# Patient Record
Sex: Female | Born: 1970 | Race: White | Hispanic: No | Marital: Married | Smoking: Former smoker
Health system: Southern US, Community
[De-identification: ages and names within clinical notes are randomized; demographics above are authoritative.]

## PROBLEM LIST (undated history)

## (undated) DIAGNOSIS — F329 Major depressive disorder, single episode, unspecified: Secondary | ICD-10-CM

## (undated) DIAGNOSIS — F419 Anxiety disorder, unspecified: Secondary | ICD-10-CM

## (undated) DIAGNOSIS — K589 Irritable bowel syndrome without diarrhea: Secondary | ICD-10-CM

## (undated) DIAGNOSIS — N62 Hypertrophy of breast: Secondary | ICD-10-CM

## (undated) DIAGNOSIS — Z98811 Dental restoration status: Secondary | ICD-10-CM

## (undated) HISTORY — DX: Irritable bowel syndrome without diarrhea: K58.9

## (undated) HISTORY — PX: NASAL SINUS SURGERY: SHX719

## (undated) HISTORY — DX: Anxiety disorder, unspecified: F41.9

## (undated) HISTORY — PX: TUBAL LIGATION: SHX77

---
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
## 2006-09-02 HISTORY — PX: DILATION AND CURETTAGE OF UTERUS: SHX78

## 2009-09-18 ENCOUNTER — Encounter: Admission: RE | Admit: 2009-09-18 | Discharge: 2009-09-18 | Payer: Self-pay | Admitting: Obstetrics and Gynecology

## 2009-09-21 ENCOUNTER — Encounter: Admission: RE | Admit: 2009-09-21 | Discharge: 2009-09-21 | Payer: Self-pay | Admitting: Obstetrics and Gynecology

## 2009-09-25 ENCOUNTER — Encounter: Admission: RE | Admit: 2009-09-25 | Discharge: 2009-09-25 | Payer: Self-pay | Admitting: Obstetrics and Gynecology

## 2009-12-14 ENCOUNTER — Ambulatory Visit: Payer: Self-pay | Admitting: Oncology

## 2010-05-29 ENCOUNTER — Encounter: Admission: RE | Admit: 2010-05-29 | Discharge: 2010-05-29 | Payer: Self-pay | Admitting: Obstetrics and Gynecology

## 2011-02-04 ENCOUNTER — Ambulatory Visit (INDEPENDENT_AMBULATORY_CARE_PROVIDER_SITE_OTHER): Payer: BC Managed Care – PPO | Admitting: Family Medicine

## 2011-02-04 ENCOUNTER — Encounter: Payer: Self-pay | Admitting: Family Medicine

## 2011-02-04 VITALS — BP 100/68 | HR 72 | Temp 98.5°F | Ht 66.0 in | Wt 115.0 lb

## 2011-02-04 DIAGNOSIS — IMO0002 Reserved for concepts with insufficient information to code with codable children: Secondary | ICD-10-CM

## 2011-02-04 DIAGNOSIS — M549 Dorsalgia, unspecified: Secondary | ICD-10-CM

## 2011-02-04 MED ORDER — NAPROXEN 500 MG PO TABS: 500.0000 mg | ORAL_TABLET | Freq: Two times a day (BID) | ORAL | Status: DC

## 2011-02-04 NOTE — Progress Notes (Signed)
Subjective:    Patient ID: Sabrina Chapman, female    DOB: 01-04-1971, 40 y.o.   MRN: [id number]  HPI Noticed sore on the top of her L foot foot at the base of her great toe L after camping last weekend.  Pulled something out of the skin--?stinger.  Has remained itchy.  Not changed in size, perhaps flattening some, and central area seems firmer. Denies pain, fevers, drainage, or swelling.  Pulled a muscle in her back while doing yoga 3 days ago.  Pushed herself hard in yoga, and felt it the next morning.  Feels muscular, painful, both sides of the low back and sacral area, right slightly greater than left.  Denies radiation of pain.  Denies paresthesias. Used heating pad (Thermacare) yesterday with some relief.  Planning for pregnancy soon, isn't using any contraception (just withdrawal method).  Past Medical History  Diagnosis Date  . Allergy     seasonal, plus cats, dust  . Anxiety in her early 20's    took Paxil    Past Surgical History  Procedure Date  . Dilation and curettage of uterus 2008  . Sinus surgery     polyp removal    History   Social History  . Marital Status: Single    Spouse Name: N/A    Number of Children: N/A  . Years of Education: N/A   Occupational History  . 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 Smoking status: Former Smoker    Quit date: 09/02/1997  . Smokeless tobacco: Never Used  . Alcohol Use: Yes     1-2 drinks every other day  . Drug Use: No  . Sexually Active: Yes    Birth Control/ Protection: Coitus interruptus   Other Topics Concern  . Not on file   Social History Narrative   Engaged    Family History  Problem Relation Age of Onset  . Cancer Mother 40    breast  . Hyperlipidemia Mother   . Hyperlipidemia Father   . Allergies Father   . Depression Father   . Cancer Maternal Grandmother 50    breast cancer  . Heart disease Maternal Grandfather   . Depression Paternal  Grandmother   . Diabetes Neg Hx     Current outpatient prescriptions:folic acid (FOLVITE) 400 MCG tablet, Take 1,200 mcg by mouth daily.  , Disp: , Rfl: ;  naproxen (NAPROSYN) 500 MG tablet, Take 1 tablet (500 mg total) by mouth 2 (two) times daily with a meal., Disp: 30 tablet, Rfl: 0  No Known Allergies  Review of Systems Denies fevers, myalgias, urinary complaints ; Denies URI symptoms, cough, SOB, chest pain, GI complaints, other skin concerns  Objective:   Physical Exam  Well developed, well nourished patient, in no distress BP 100/68  Pulse 72  Temp(Src) 98.5 F (36.9 C) (Oral)  Ht 5\' 6"  (1.676 m)  Wt 115 lb (52.164 kg)  BMI 18.56 kg/m2  LMP 01/20/2011 Neck: No lymphadenopathy or thyromegal Heart:  Regular rate and rhythm, no murmurs, rubs, gallops or ectopy Lungs:  Clear bilaterally, without wheezes, rales or ronchi Abdomen:  Soft, nontender, nondistended, no hepatosplenomegaly or masses, normal bowel sounds Extremities:  No clubbing, cyanosis or edema, 2+ pulses.  Neuro:  Alert and oriented x 3, cranial nerves grossly intact.  DTR's 2+ and symmetric.  Normal strength and sensation Back:  No spine or CVA tenderness.  Mild tenderness, and minimal spasm R lumbar paraspinous muscles.  SI joints nontender. Skin: no rashes or suspicious lesions. Left foot--oval raised area just proximal to  1st MTP.  Small hole centrally noted.  No evidence of foreign body, nontender to touch.  No drainage, warmth, streaks Psych:  Normal mood, affect, hygiene and grooming, normal speech, eye contact     Assessment & Plan:   1. Back pain without radiation    Muscular etiology.  Heat, massage, stretches, NSAIDs--risks/side effects reviewed with pt. Declines muscle relaxant, only mild spasm noted  2. Insect bite of foot or toe    Educated re: signs/symptoms of infection, expected course if there is retained FB.     Naproxen Rx sent to pharmacy. NSAID precautions were reviewed with the patient.   Patient should take medication with food, discontinue if develops GI side effects, not to take other OTC NSAIDs at the same time, and not to use longer than recommended.

## 2011-02-04 NOTE — Patient Instructions (Signed)
Follow up if you are having ongoing back pain, pain or numbness radiating into your legs, weakness, fevers or other problems. Follow up if skin lesion on foot gets larger, is draining, develops redness, warmth, fever or any other concern

## 2011-02-05 ENCOUNTER — Encounter: Payer: Self-pay | Admitting: Family Medicine

## 2011-02-27 ENCOUNTER — Other Ambulatory Visit: Payer: Self-pay | Admitting: Obstetrics and Gynecology

## 2011-02-27 DIAGNOSIS — Z1231 Encounter for screening mammogram for malignant neoplasm of breast: Secondary | ICD-10-CM

## 2011-02-27 DIAGNOSIS — N63 Unspecified lump in unspecified breast: Secondary | ICD-10-CM

## 2011-04-03 ENCOUNTER — Ambulatory Visit (INDEPENDENT_AMBULATORY_CARE_PROVIDER_SITE_OTHER): Payer: BC Managed Care – PPO | Admitting: Family Medicine

## 2011-04-03 ENCOUNTER — Encounter: Payer: Self-pay | Admitting: Family Medicine

## 2011-04-03 VITALS — BP 100/72 | HR 68 | Ht 66.0 in | Wt 116.0 lb

## 2011-04-03 DIAGNOSIS — K589 Irritable bowel syndrome without diarrhea: Secondary | ICD-10-CM

## 2011-04-03 DIAGNOSIS — N926 Irregular menstruation, unspecified: Secondary | ICD-10-CM

## 2011-04-03 LAB — POCT URINE PREGNANCY: Preg Test, Ur: NEGATIVE

## 2011-04-03 MED ORDER — DICYCLOMINE HCL 10 MG PO CAPS: 10.0000 mg | ORAL_CAPSULE | Freq: Three times a day (TID) | ORAL | Status: DC

## 2011-04-03 NOTE — Progress Notes (Signed)
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  She had thought that her problems were related to fructose, but lately has realized that she is having problems with citric acid also (noted in some jams and other foods).  When she eats things containing citric acid, she gets stomach cramps and diarrhea. No issues with lactose--eats a lot of cheese.  Lemons, lime, grapefruit cause problems.  No problems with berries.   Denies blood in stool or mucusy stool, just immediate cramp, followed by loose stool, then pain resolves.  Previously had rx for Librax for treatment of these IBS symptoms--which can prevent some of the symptoms if taken early.  She has been out of it for about a month.  Symptoms are more severe and prolonged without the medication, sometimes lasting for up to a day and a half.  She is trying for pregnancy.  Had somewhat irregular periods recently, concerned about the possibility of recent miscarriage versus current pregnancy.   She had a normal cycle 5/20, lasted a week.  Then, no period in June until 7/1, lasted 4 days, heavy, not typical, abruptly stopped on 4th day.   Prior to this cycle, she had symptoms of breast tenderness, fatigue, slight nausea.  Had a negative pregnancy test prior to getting last period.   Then had what seemed like a normal menstrual cycle that started on 7/20.  Past Medical History  Diagnosis Date  . Allergy     seasonal, plus cats, dust  . Anxiety in her early 20's    took Paxil  . IBS (irritable bowel syndrome)     Past Surgical History  Procedure Date  . Dilation and curettage of uterus 2008  . Sinus surgery     polyp removal    History   Social History  . Marital Status: Single    Spouse Name: N/A    Number of Children: N/A  . Years of Education: N/A   Occupational History  . 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 Smoking status: Former Smoker    Quit  date: 09/02/1997  . Smokeless tobacco: Never Used  . Alcohol Use: Yes     1-2 drinks every other day  . Drug Use: No  . Sexually Active: Yes    Birth Control/ Protection: Coitus interruptus   Other Topics Concern  . Not on file   Social History Narrative   Engaged    Family History  Problem Relation Age of Onset  . Cancer Mother 3    breast  . Hyperlipidemia Mother   . Hyperlipidemia Father   . Allergies Father   . Depression Father   . Cancer Maternal Grandmother 50    breast cancer  . Heart disease Maternal Grandfather   . Depression Paternal Grandmother   . Diabetes Neg Hx    No current outpatient prescriptions on file prior to visit.   No Known Allergies    ROS: denies fever, abdominal pain, nausea, vomiting.  See HPI.  Denies joint/muscle pains  PHYSICAL EXAM: BP 100/72  Pulse 68  Ht 5\' 6"  (1.676 m)  Wt 116 lb (52.617 kg)  BMI 18.72 kg/m2  LMP 03/24/2011 Well developed, pleasant female, in no distress Abdomen: soft, nontender, active bowel sounds.  No organomegaly or masses Psych: normal mood, affect, hygiene, grooming Skin: no rash  Urine pregnancy test negative   ASSESSMENT/PLAN: 1. IBS (irritable bowel syndrome)  2. Menstrual irregularity  POCT urine pregnancy   Discussed avoidance of trigger foods.  Will change from librax to a trial of Bentyl due to pregnancy safety issues (Librax is Cat D, potential human fetal risk; Bentyl is Cat B).  Try and avoid use in first trimester

## 2011-04-03 NOTE — Patient Instructions (Signed)
Try to minimize use of medications in first trimester of pregnancy.  The Bentyl seems to be safe in 2nd and 3rd trimester.  Avoidance of known triggers is key.

## 2011-12-05 LAB — OB RESULTS CONSOLE ABO/RH: RH Type: POSITIVE

## 2011-12-05 LAB — OB RESULTS CONSOLE RUBELLA ANTIBODY, IGM: Rubella: IMMUNE

## 2012-06-16 ENCOUNTER — Telehealth (HOSPITAL_COMMUNITY): Payer: Self-pay | Admitting: *Deleted

## 2012-06-16 ENCOUNTER — Encounter (HOSPITAL_COMMUNITY): Payer: Self-pay | Admitting: *Deleted

## 2012-06-16 NOTE — Telephone Encounter (Signed)
Preadmission screen  

## 2012-06-23 ENCOUNTER — Observation Stay (HOSPITAL_COMMUNITY): Admission: RE | Admit: 2012-06-23 | Payer: Self-pay | Source: Ambulatory Visit | Admitting: Obstetrics & Gynecology

## 2012-07-01 ENCOUNTER — Encounter (HOSPITAL_COMMUNITY): Payer: Self-pay | Admitting: Pharmacist

## 2012-07-07 ENCOUNTER — Other Ambulatory Visit: Payer: Self-pay | Admitting: Obstetrics and Gynecology

## 2012-07-09 ENCOUNTER — Other Ambulatory Visit: Payer: Self-pay | Admitting: Obstetrics and Gynecology

## 2012-07-13 ENCOUNTER — Encounter (HOSPITAL_COMMUNITY): Payer: Self-pay

## 2012-07-13 ENCOUNTER — Encounter (HOSPITAL_COMMUNITY)
Admission: RE | Admit: 2012-07-13 | Discharge: 2012-07-13 | Disposition: A | Discharge status: Home / Self Care | Payer: BC Managed Care – PPO | Source: Ambulatory Visit | Attending: Obstetrics and Gynecology | Admitting: Obstetrics and Gynecology

## 2012-07-13 LAB — CBC
HCT: 37.5 % (ref 36.0–46.0)
Platelets: 233 10*3/uL (ref 150–400)
RDW: 13.3 % (ref 11.5–15.5)
WBC: 8.7 10*3/uL (ref 4.0–10.5)

## 2012-07-13 LAB — SURGICAL PCR SCREEN: MRSA, PCR: NEGATIVE

## 2012-07-13 NOTE — Patient Instructions (Addendum)
Your procedure is scheduled on:07/15/12  Enter through the Main Entrance at :10:45 am per hospital ( 10am per Dr. Henderson Cloud) Pick up desk phone and dial [phone] and inform us of your arrival.  Please call [phone] if you have any problems the morning of surgery.  Remember: Do not eat after midnight:Tuesday Clear liquids ok until 0800 am Wed.  Take these meds the morning of surgery with a sip of water:none  DO NOT wear jewelry, eye make-up, lipstick,body lotion, or dark fingernail polish. Do not shave for 48 hours prior to surgery.  If you are to be admitted after surgery, leave suitcase in car until your room has been assigned. Patients discharged on the day of surgery will not be allowed to drive home.

## 2012-07-13 NOTE — H&P (Addendum)
41 y.o.  [redacted]w[redacted]d    G1P0 comes in for a cesarean section at term for frank breech.  Patient has good fetal movement and no bleeding.    Past Medical History  Diagnosis Date  . Allergy     seasonal, plus cats, dust  . Anxiety in her early 20's    took Paxil  . IBS (irritable bowel syndrome)   . AMA (advanced maternal age) multigravida 35+     Past Surgical History  Procedure Date  . Dilation and curettage of uterus 2008  . Sinus surgery     polyp removal    OB History    Grav Para Term Preterm Abortions TAB SAB Ect Mult Living   1              # Outc Date GA Lbr Len/2nd Wgt Sex Del Anes PTL Lv   1 CUR               History   Social History  . Marital Status: Married    Spouse Name: N/A    Number of Children: N/A  . Years of Education: N/A   Occupational History  . 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 Smoking status: Former Smoker    Quit date: 09/02/1997  . Smokeless tobacco: Never Used  . Alcohol Use: Yes     Comment: 1-2 drinks every other day  . Drug Use: No  . Sexually Active: Yes    Birth Control/ Protection: Coitus interruptus   Other Topics Concern  . Not on file   Social History Narrative   Engaged   Review of patient's allergies indicates no known allergies.   Prenatal Course: Uncomplicated; AMA, underwent CVS and chromosomes are 46XX  There were no vitals filed for this visit.   Lungs/Cor:  NAD Abdomen:  soft, gravid Ex:  no cords, erythema SVE:  NA FHTs:  Present.  A/P   For cesarean sectionat term for frank breech.  All risks, benefits and alternatives discussed with patient and she desires to proceed.  Davetta Olliff A

## 2012-07-14 ENCOUNTER — Encounter (HOSPITAL_COMMUNITY): Payer: Self-pay

## 2012-07-14 LAB — RPR: RPR Ser Ql: NONREACTIVE

## 2012-07-15 ENCOUNTER — Inpatient Hospital Stay (HOSPITAL_COMMUNITY): Payer: BC Managed Care – PPO | Admitting: Anesthesiology

## 2012-07-15 ENCOUNTER — Encounter (HOSPITAL_COMMUNITY): Payer: Self-pay | Admitting: Anesthesiology

## 2012-07-15 ENCOUNTER — Inpatient Hospital Stay (HOSPITAL_COMMUNITY)
Admission: AD | Admit: 2012-07-15 | Discharge: 2012-07-18 | DRG: 371 | Disposition: A | Discharge status: Home / Self Care | Payer: BC Managed Care – PPO | Source: Ambulatory Visit | Attending: Obstetrics and Gynecology | Admitting: Obstetrics and Gynecology

## 2012-07-15 ENCOUNTER — Encounter (HOSPITAL_COMMUNITY): Payer: Self-pay | Admitting: *Deleted

## 2012-07-15 ENCOUNTER — Encounter (HOSPITAL_COMMUNITY)
Admission: AD | Disposition: A | Discharge status: Home / Self Care | Payer: Self-pay | Source: Ambulatory Visit | Attending: Obstetrics and Gynecology

## 2012-07-15 DIAGNOSIS — Z01812 Encounter for preprocedural laboratory examination: Secondary | ICD-10-CM

## 2012-07-15 DIAGNOSIS — O321XX0 Maternal care for breech presentation, not applicable or unspecified: Principal | ICD-10-CM | POA: Diagnosis present

## 2012-07-15 DIAGNOSIS — O09519 Supervision of elderly primigravida, unspecified trimester: Secondary | ICD-10-CM | POA: Diagnosis present

## 2012-07-15 DIAGNOSIS — O34219 Maternal care for unspecified type scar from previous cesarean delivery: Secondary | ICD-10-CM

## 2012-07-15 SURGERY — Surgical Case
Anesthesia: Regional | Site: Abdomen | Wound class: Clean Contaminated

## 2012-07-15 MED ORDER — METHYLERGONOVINE MALEATE 0.2 MG/ML IJ SOLN: 0.2000 mg | INTRAMUSCULAR | Status: DC | PRN

## 2012-07-15 MED ORDER — KETOROLAC TROMETHAMINE 60 MG/2ML IM SOLN
INTRAMUSCULAR | Status: AC
  Administered 2012-07-15: 60 mg via INTRAMUSCULAR
  Filled 2012-07-15: qty 2

## 2012-07-15 MED ORDER — WITCH HAZEL-GLYCERIN EX PADS: 1.0000 "application " | MEDICATED_PAD | CUTANEOUS | Status: DC | PRN

## 2012-07-15 MED ORDER — ONDANSETRON HCL 4 MG/2ML IJ SOLN
INTRAMUSCULAR | Status: DC | PRN
  Administered 2012-07-15: 4 mg via INTRAVENOUS

## 2012-07-15 MED ORDER — HYDROMORPHONE HCL PF 1 MG/ML IJ SOLN
0.2500 mg | INTRAMUSCULAR | Status: DC | PRN
  Administered 2012-07-15 (×2): 0.5 mg via INTRAVENOUS

## 2012-07-15 MED ORDER — ONDANSETRON HCL 4 MG/2ML IJ SOLN: 4.0000 mg | INTRAMUSCULAR | Status: DC | PRN

## 2012-07-15 MED ORDER — EPHEDRINE SULFATE 50 MG/ML IJ SOLN
INTRAMUSCULAR | Status: DC | PRN
  Administered 2012-07-15: 10 mg via INTRAVENOUS

## 2012-07-15 MED ORDER — ZOLPIDEM TARTRATE 5 MG PO TABS: 5.0000 mg | ORAL_TABLET | Freq: Every evening | ORAL | Status: DC | PRN

## 2012-07-15 MED ORDER — NALBUPHINE HCL 10 MG/ML IJ SOLN
5.0000 mg | INTRAMUSCULAR | Status: DC | PRN
  Filled 2012-07-15: qty 1

## 2012-07-15 MED ORDER — CEFAZOLIN SODIUM-DEXTROSE 2-3 GM-% IV SOLR
INTRAVENOUS | Status: DC | PRN
  Administered 2012-07-15: 2 g via INTRAVENOUS

## 2012-07-15 MED ORDER — SCOPOLAMINE 1 MG/3DAYS TD PT72
1.0000 | MEDICATED_PATCH | Freq: Once | TRANSDERMAL | Status: DC
  Administered 2012-07-15: 1.5 mg via TRANSDERMAL

## 2012-07-15 MED ORDER — EPHEDRINE 5 MG/ML INJ
INTRAVENOUS | Status: AC
  Filled 2012-07-15: qty 10

## 2012-07-15 MED ORDER — LANOLIN HYDROUS EX OINT: 1.0000 "application " | TOPICAL_OINTMENT | CUTANEOUS | Status: DC | PRN

## 2012-07-15 MED ORDER — PRENATAL MULTIVITAMIN CH
1.0000 | ORAL_TABLET | Freq: Every day | ORAL | Status: DC
  Administered 2012-07-16 – 2012-07-18 (×3): 1 via ORAL
  Filled 2012-07-15 (×3): qty 1

## 2012-07-15 MED ORDER — DIBUCAINE 1 % RE OINT: 1.0000 "application " | TOPICAL_OINTMENT | RECTAL | Status: DC | PRN

## 2012-07-15 MED ORDER — KETOROLAC TROMETHAMINE 30 MG/ML IJ SOLN: 15.0000 mg | Freq: Once | INTRAMUSCULAR | Status: DC | PRN

## 2012-07-15 MED ORDER — MORPHINE SULFATE (PF) 0.5 MG/ML IJ SOLN
INTRAMUSCULAR | Status: DC | PRN
  Administered 2012-07-15: 100 ug via INTRATHECAL

## 2012-07-15 MED ORDER — LACTATED RINGERS IV SOLN
INTRAVENOUS | Status: DC
  Administered 2012-07-15: 23:00:00 via INTRAVENOUS

## 2012-07-15 MED ORDER — MORPHINE SULFATE 0.5 MG/ML IJ SOLN
INTRAMUSCULAR | Status: AC
  Filled 2012-07-15: qty 10

## 2012-07-15 MED ORDER — OXYTOCIN 40 UNITS IN LACTATED RINGERS INFUSION - SIMPLE MED: 62.5000 mL/h | INTRAVENOUS | Status: AC

## 2012-07-15 MED ORDER — DIPHENHYDRAMINE HCL 25 MG PO CAPS: 25.0000 mg | ORAL_CAPSULE | Freq: Four times a day (QID) | ORAL | Status: DC | PRN

## 2012-07-15 MED ORDER — IBUPROFEN 600 MG PO TABS
600.0000 mg | ORAL_TABLET | Freq: Four times a day (QID) | ORAL | Status: DC
  Administered 2012-07-15 – 2012-07-18 (×11): 600 mg via ORAL
  Filled 2012-07-15 (×11): qty 1

## 2012-07-15 MED ORDER — KETOROLAC TROMETHAMINE 30 MG/ML IJ SOLN: 30.0000 mg | Freq: Four times a day (QID) | INTRAMUSCULAR | Status: AC | PRN

## 2012-07-15 MED ORDER — LACTATED RINGERS IV SOLN
Freq: Once | INTRAVENOUS | Status: AC
  Administered 2012-07-15: 11:00:00 via INTRAVENOUS

## 2012-07-15 MED ORDER — METOCLOPRAMIDE HCL 5 MG/ML IJ SOLN: 10.0000 mg | Freq: Three times a day (TID) | INTRAMUSCULAR | Status: DC | PRN

## 2012-07-15 MED ORDER — SIMETHICONE 80 MG PO CHEW
80.0000 mg | CHEWABLE_TABLET | Freq: Three times a day (TID) | ORAL | Status: DC
  Administered 2012-07-15 – 2012-07-18 (×9): 80 mg via ORAL

## 2012-07-15 MED ORDER — SIMETHICONE 80 MG PO CHEW: 80.0000 mg | CHEWABLE_TABLET | ORAL | Status: DC | PRN

## 2012-07-15 MED ORDER — LACTATED RINGERS IV SOLN
INTRAVENOUS | Status: DC | PRN
  Administered 2012-07-15 (×3): via INTRAVENOUS

## 2012-07-15 MED ORDER — HYDROMORPHONE HCL PF 1 MG/ML IJ SOLN
INTRAMUSCULAR | Status: AC
  Administered 2012-07-15: 0.5 mg via INTRAVENOUS
  Filled 2012-07-15: qty 1

## 2012-07-15 MED ORDER — ACETAMINOPHEN 10 MG/ML IV SOLN
1000.0000 mg | Freq: Four times a day (QID) | INTRAVENOUS | Status: AC | PRN
  Administered 2012-07-15: 1000 mg via INTRAVENOUS
  Filled 2012-07-15: qty 100

## 2012-07-15 MED ORDER — FENTANYL CITRATE 0.05 MG/ML IJ SOLN
INTRAMUSCULAR | Status: DC | PRN
  Administered 2012-07-15: 25 ug via INTRATHECAL

## 2012-07-15 MED ORDER — CEFAZOLIN SODIUM-DEXTROSE 2-3 GM-% IV SOLR: 2.0000 g | INTRAVENOUS | Status: DC

## 2012-07-15 MED ORDER — BUPIVACAINE HCL (PF) 0.5 % IJ SOLN
INTRAMUSCULAR | Status: DC | PRN
  Administered 2012-07-15: 12 mg

## 2012-07-15 MED ORDER — DIPHENHYDRAMINE HCL 25 MG PO CAPS: 25.0000 mg | ORAL_CAPSULE | ORAL | Status: DC | PRN

## 2012-07-15 MED ORDER — METHYLERGONOVINE MALEATE 0.2 MG PO TABS: 0.2000 mg | ORAL_TABLET | ORAL | Status: DC | PRN

## 2012-07-15 MED ORDER — NALOXONE HCL 0.4 MG/ML IJ SOLN
1.0000 ug/kg/h | INTRAMUSCULAR | Status: DC | PRN
  Filled 2012-07-15: qty 2.5

## 2012-07-15 MED ORDER — ONDANSETRON HCL 4 MG/2ML IJ SOLN
INTRAMUSCULAR | Status: AC
  Filled 2012-07-15: qty 2

## 2012-07-15 MED ORDER — MENTHOL 3 MG MT LOZG: 1.0000 | LOZENGE | OROMUCOSAL | Status: DC | PRN

## 2012-07-15 MED ORDER — SODIUM CHLORIDE 0.9 % IJ SOLN: 3.0000 mL | INTRAMUSCULAR | Status: DC | PRN

## 2012-07-15 MED ORDER — DIPHENHYDRAMINE HCL 50 MG/ML IJ SOLN: 12.5000 mg | INTRAMUSCULAR | Status: DC | PRN

## 2012-07-15 MED ORDER — TETANUS-DIPHTH-ACELL PERTUSSIS 5-2.5-18.5 LF-MCG/0.5 IM SUSP
0.5000 mL | Freq: Once | INTRAMUSCULAR | Status: AC
  Administered 2012-07-16: 0.5 mL via INTRAMUSCULAR
  Filled 2012-07-15: qty 0.5

## 2012-07-15 MED ORDER — SCOPOLAMINE 1 MG/3DAYS TD PT72
MEDICATED_PATCH | TRANSDERMAL | Status: AC
  Administered 2012-07-15: 1.5 mg via TRANSDERMAL
  Filled 2012-07-15: qty 1

## 2012-07-15 MED ORDER — FERROUS SULFATE 325 (65 FE) MG PO TABS
325.0000 mg | ORAL_TABLET | Freq: Two times a day (BID) | ORAL | Status: DC
  Administered 2012-07-16 – 2012-07-18 (×5): 325 mg via ORAL
  Filled 2012-07-15 (×5): qty 1

## 2012-07-15 MED ORDER — OXYTOCIN 10 UNIT/ML IJ SOLN
40.0000 [IU] | INTRAVENOUS | Status: DC | PRN
  Administered 2012-07-15: 40 [IU] via INTRAVENOUS

## 2012-07-15 MED ORDER — OXYTOCIN 10 UNIT/ML IJ SOLN
INTRAMUSCULAR | Status: AC
  Filled 2012-07-15: qty 4

## 2012-07-15 MED ORDER — CEFAZOLIN SODIUM-DEXTROSE 2-3 GM-% IV SOLR
INTRAVENOUS | Status: AC
  Filled 2012-07-15: qty 50

## 2012-07-15 MED ORDER — ONDANSETRON HCL 4 MG/2ML IJ SOLN: 4.0000 mg | Freq: Three times a day (TID) | INTRAMUSCULAR | Status: DC | PRN

## 2012-07-15 MED ORDER — PANTOPRAZOLE SODIUM 20 MG PO TBEC
20.0000 mg | DELAYED_RELEASE_TABLET | Freq: Every day | ORAL | Status: DC
  Administered 2012-07-18: 20 mg via ORAL
  Filled 2012-07-15 (×4): qty 1

## 2012-07-15 MED ORDER — DIPHENHYDRAMINE HCL 50 MG/ML IJ SOLN: 25.0000 mg | INTRAMUSCULAR | Status: DC | PRN

## 2012-07-15 MED ORDER — ONDANSETRON HCL 4 MG PO TABS: 4.0000 mg | ORAL_TABLET | ORAL | Status: DC | PRN

## 2012-07-15 MED ORDER — OXYCODONE-ACETAMINOPHEN 5-325 MG PO TABS
1.0000 | ORAL_TABLET | ORAL | Status: DC | PRN
Start: 2012-07-15 — End: 2012-07-18
  Administered 2012-07-16 – 2012-07-18 (×9): 2 via ORAL
  Filled 2012-07-15 (×9): qty 2

## 2012-07-15 MED ORDER — PROMETHAZINE HCL 25 MG/ML IJ SOLN: 6.2500 mg | INTRAMUSCULAR | Status: DC | PRN

## 2012-07-15 MED ORDER — SENNOSIDES-DOCUSATE SODIUM 8.6-50 MG PO TABS
2.0000 | ORAL_TABLET | Freq: Every day | ORAL | Status: DC
  Administered 2012-07-15 – 2012-07-17 (×3): 2 via ORAL

## 2012-07-15 MED ORDER — FENTANYL CITRATE 0.05 MG/ML IJ SOLN
INTRAMUSCULAR | Status: AC
  Filled 2012-07-15: qty 2

## 2012-07-15 MED ORDER — PHENYLEPHRINE HCL 10 MG/ML IJ SOLN
INTRAMUSCULAR | Status: DC | PRN
  Administered 2012-07-15: 40 ug via INTRAVENOUS
  Administered 2012-07-15 (×2): 80 ug via INTRAVENOUS

## 2012-07-15 MED ORDER — NALOXONE HCL 0.4 MG/ML IJ SOLN: 0.4000 mg | INTRAMUSCULAR | Status: DC | PRN

## 2012-07-15 MED ORDER — MEASLES, MUMPS & RUBELLA VAC ~~LOC~~ INJ: 0.5000 mL | INJECTION | Freq: Once | SUBCUTANEOUS | Status: DC

## 2012-07-15 MED ORDER — PHENYLEPHRINE 40 MCG/ML (10ML) SYRINGE FOR IV PUSH (FOR BLOOD PRESSURE SUPPORT)
PREFILLED_SYRINGE | INTRAVENOUS | Status: AC
  Filled 2012-07-15: qty 5

## 2012-07-15 MED ORDER — KETOROLAC TROMETHAMINE 60 MG/2ML IM SOLN
60.0000 mg | Freq: Once | INTRAMUSCULAR | Status: AC | PRN
  Administered 2012-07-15: 60 mg via INTRAMUSCULAR

## 2012-07-15 MED ORDER — BISACODYL 10 MG RE SUPP: 10.0000 mg | Freq: Every day | RECTAL | Status: DC | PRN

## 2012-07-15 MED ORDER — FLEET ENEMA 7-19 GM/118ML RE ENEM: 1.0000 | ENEMA | Freq: Every day | RECTAL | Status: DC | PRN

## 2012-07-15 MED ORDER — MEPERIDINE HCL 25 MG/ML IJ SOLN: 6.2500 mg | INTRAMUSCULAR | Status: DC | PRN

## 2012-07-15 SURGICAL SUPPLY — 31 items
CLOTH BEACON ORANGE TIMEOUT ST (SAFETY) ×2 IMPLANT
DERMABOND ADVANCED (GAUZE/BANDAGES/DRESSINGS) ×2
DERMABOND ADVANCED .7 DNX12 (GAUZE/BANDAGES/DRESSINGS) ×2 IMPLANT
DRAPE SURG 17X23 STRL (DRAPES) ×2 IMPLANT
DRSG COVADERM 4X10 (GAUZE/BANDAGES/DRESSINGS) ×4 IMPLANT
DURAPREP 26ML APPLICATOR (WOUND CARE) ×2 IMPLANT
ELECT REM PT RETURN 9FT ADLT (ELECTROSURGICAL) ×2
ELECTRODE REM PT RTRN 9FT ADLT (ELECTROSURGICAL) ×1 IMPLANT
EXTRACTOR VACUUM BELL STYLE (SUCTIONS) IMPLANT
GLOVE BIO SURGEON STRL SZ7 (GLOVE) ×4 IMPLANT
GOWN PREVENTION PLUS LG XLONG (DISPOSABLE) ×4 IMPLANT
KIT ABG SYR 3ML LUER SLIP (SYRINGE) IMPLANT
NEEDLE HYPO 25X5/8 SAFETYGLIDE (NEEDLE) IMPLANT
NS IRRIG 1000ML POUR BTL (IV SOLUTION) ×2 IMPLANT
PACK C SECTION WH (CUSTOM PROCEDURE TRAY) ×2 IMPLANT
PAD OB MATERNITY 4.3X12.25 (PERSONAL CARE ITEMS) IMPLANT
RETRACTOR WND ALEXIS 25 LRG (MISCELLANEOUS) ×1 IMPLANT
RTRCTR WOUND ALEXIS 25CM LRG (MISCELLANEOUS) ×2
SLEEVE SCD COMPRESS KNEE MED (MISCELLANEOUS) IMPLANT
STAPLER VISISTAT 35W (STAPLE) IMPLANT
SUT MNCRL 0 VIOLET CTX 36 (SUTURE) ×2 IMPLANT
SUT MONOCRYL 0 CTX 36 (SUTURE) ×2
SUT PDS AB 0 CTX 60 (SUTURE) IMPLANT
SUT PLAIN 2 0 XLH (SUTURE) IMPLANT
SUT VIC AB 0 CT1 27 (SUTURE) ×2
SUT VIC AB 0 CT1 27XBRD ANBCTR (SUTURE) ×2 IMPLANT
SUT VIC AB 2-0 CT1 27 (SUTURE) ×1
SUT VIC AB 2-0 CT1 TAPERPNT 27 (SUTURE) ×1 IMPLANT
TOWEL OR 17X24 6PK STRL BLUE (TOWEL DISPOSABLE) ×4 IMPLANT
TRAY FOLEY CATH 14FR (SET/KITS/TRAYS/PACK) ×2 IMPLANT
WATER STERILE IRR 1000ML POUR (IV SOLUTION) ×2 IMPLANT

## 2012-07-15 NOTE — Anesthesia Postprocedure Evaluation (Signed)
Anesthesia Post Note  Patient: Sabrina Chapman  Procedure(s) Performed: Procedure(s) (LRB): CESAREAN SECTION (N/A)  Anesthesia type: Spinal  Patient location: PACU  Post pain: Pain level controlled  Post assessment: Post-op Vital signs reviewed  Last Vitals:  Filed Vitals:   07/15/12 1300  BP:   Pulse: 71  Temp:   Resp: 20    Post vital signs: Reviewed  Level of consciousness: awake  Complications: No apparent anesthesia complications

## 2012-07-15 NOTE — Addendum Note (Signed)
Addendum  created 07/15/12 1934 by Graciela Husbands, CRNA   Modules edited:Notes Section

## 2012-07-15 NOTE — Addendum Note (Signed)
Addendum  created 07/15/12 1718 by Velna Hatchet, MD   Modules edited:Orders

## 2012-07-15 NOTE — Op Note (Addendum)
07/15/2012  11:36 AM  PATIENT:  Sabrina Chapman  41 y.o. female  PRE-OPERATIVE DIAGNOSIS:  BREECH PRIMARY  POST-OPERATIVE DIAGNOSIS: same  PROCEDURE:  Procedure(s) (LRB) with comments: CESAREAN SECTION (N/Chapman)  SURGEON:  Surgeon(s) and Role:    * Tamee Battin Chapman Maxyne Derocher, MD - Primary    * W Scott Bowie, MD - Assisting  PHYSICIAN ASSISTANT:   ASSISTANTS: Dr. Bowie   ANESTHESIA:   spinal  EBL:  Total I/O In: 2000 [I.V.:2000] Out: 800 [Urine:200; Blood:600]  SPECIMEN:  No Specimen  DISPOSITION OF SPECIMEN:  N/Chapman  COUNTS:  YES  TOURNIQUET:  * No tourniquets in log *  DICTATION: .Note written in EPIC  PLAN OF CARE: Admit to inpatient   PATIENT DISPOSITION:  PACU - hemodynamically stable.   Delay start of Pharmacological VTE agent (>24hrs) due to surgical blood loss or risk of bleeding: not applicable  Complications:  none Medications:  Ancef, Pitocin Findings:  Baby female, Apgars 9,9, weight P.   Normal tubes, ovaries and uterus seen.  Technique:  After adequate spinal anesthesia was achieved, the patient was prepped and draped in usual sterile fashion.  Chapman foley catheter was used to drain the bladder.  Chapman pfannanstiel incision was made with the scalpel and carried down to the fascia with the bovie cautery. The fascia was incised in the midline with the scalpel and carried in Chapman transverse curvilinear manner bilaterally.  The fascia was reflected superiorly and inferiorly off the rectus muscles and the muscles split in the midline.  Chapman bowel free portion of the peritoneum was entered bluntly and then extended in Chapman superior and inferior manner with good visualization of the bowel and bladder.  The Alexis instrument was then placed and the vesico-uterine fascia tented up and incised in Chapman transverse curvilinear manner.  Chapman 2 cm transverse incision was made in the upper portion of the lower uterine segment until the amnion was exposed.   The incision was extended transversely  in Chapman blunt manner.  Clear fluid with small amount meconium at the fetal anus only was noted and the baby delivered in the breech presentation without complication.  The baby was bulb suctioned and the cord was clamped and cut.  The baby was then handed to awaiting Neonatology.  The placenta was then delivered manually and the uterus cleared of all debris.  The uterine incision was then closed with Chapman running lock stitch of 0 monocryl.  An imbricating layer of 0 monocryl was closed as well. Good hemostasis of the uterine incision was achieved and the abdomen was cleared with irrigation.  The peritoneum was closed with Chapman running stitch of 2-0 vicryl.  This incorporated the rectus muscles as Chapman separate layer.  The fascia was then closed with Chapman running stitch of 0 vicryl.  The subcutaneous layer was closed with interrupted  stitches of 2-0 plain gut.  The skin was closed with staples.  The patient tolerated the procedure well and was returned to the recovery room in stable condition.  All counts were correct times three.  Sabrina Chapman    

## 2012-07-15 NOTE — Brief Op Note (Addendum)
07/15/2012  11:36 AM  PATIENT:  Sabrina Chapman  41 y.o. female  PRE-OPERATIVE DIAGNOSIS:  BREECH PRIMARY  POST-OPERATIVE DIAGNOSIS: same  PROCEDURE:  Procedure(s) (LRB) with comments: CESAREAN SECTION (N/A)  SURGEON:  Surgeon(s) and Role:    * Loney Laurence, MD - Primary    * W Lodema Hong, MD - Assisting  PHYSICIAN ASSISTANT:   ASSISTANTS: Dr. Greta Doom   ANESTHESIA:   spinal  EBL:  Total I/O In: 2000 [I.V.:2000] Out: 800 [Urine:200; Blood:600]  SPECIMEN:  No Specimen  DISPOSITION OF SPECIMEN:  N/A  COUNTS:  YES  TOURNIQUET:  * No tourniquets in log *  DICTATION: .Note written in EPIC  PLAN OF CARE: Admit to inpatient   PATIENT DISPOSITION:  PACU - hemodynamically stable.   Delay start of Pharmacological VTE agent (>24hrs) due to surgical blood loss or risk of bleeding: not applicable  Complications:  none Medications:  Ancef, Pitocin Findings:  Baby female, Apgars 9,9, weight P.   Normal tubes, ovaries and uterus seen.  Technique:  After adequate spinal anesthesia was achieved, the patient was prepped and draped in usual sterile fashion.  A foley catheter was used to drain the bladder.  A pfannanstiel incision was made with the scalpel and carried down to the fascia with the bovie cautery. The fascia was incised in the midline with the scalpel and carried in a transverse curvilinear manner bilaterally.  The fascia was reflected superiorly and inferiorly off the rectus muscles and the muscles split in the midline.  A bowel free portion of the peritoneum was entered bluntly and then extended in a superior and inferior manner with good visualization of the bowel and bladder.  The Alexis instrument was then placed and the vesico-uterine fascia tented up and incised in a transverse curvilinear manner.  A 2 cm transverse incision was made in the upper portion of the lower uterine segment until the amnion was exposed.   The incision was extended transversely  in a blunt manner.  Clear fluid with small amount meconium at the fetal anus only was noted and the baby delivered in the breech presentation without complication.  The baby was bulb suctioned and the cord was clamped and cut.  The baby was then handed to awaiting Neonatology.  The placenta was then delivered manually and the uterus cleared of all debris.  The uterine incision was then closed with a running lock stitch of 0 monocryl.  An imbricating layer of 0 monocryl was closed as well. Good hemostasis of the uterine incision was achieved and the abdomen was cleared with irrigation.  The peritoneum was closed with a running stitch of 2-0 vicryl.  This incorporated the rectus muscles as a separate layer.  The fascia was then closed with a running stitch of 0 vicryl.  The subcutaneous layer was closed with interrupted  stitches of 2-0 plain gut.  The skin was closed with staples.  The patient tolerated the procedure well and was returned to the recovery room in stable condition.  All counts were correct times three.  [name] A

## 2012-07-15 NOTE — Transfer of Care (Signed)
Immediate Anesthesia Transfer of Care Note  Patient: Sabrina Chapman  Procedure(s) Performed: Procedure(s) (LRB) with comments: CESAREAN SECTION (N/A)  Patient Location: PACU  Anesthesia Type:Spinal  Level of Consciousness: awake, alert  and oriented  Airway & Oxygen Therapy: Patient Spontanous Breathing  Post-op Assessment: Report given to PACU RN and Post -op Vital signs reviewed and stable  Post vital signs: Reviewed and stable  Complications: No apparent anesthesia complications

## 2012-07-15 NOTE — Progress Notes (Signed)
There has been no change in the patients history, status or exam since the history and physical.  Filed Vitals:   06/30/12 1522 07/15/12 1019  BP:  141/98  Pulse:  85  Temp:  97.9 F (36.6 C)  TempSrc:  Oral  Resp:  18  Height: 5\' 7"  (1.702 m)   Weight: 66.225 kg (146 lb)   SpO2:  99%    Lab Results  Component Value Date   WBC 8.7 07/13/2012   HGB 13.2 07/13/2012   HCT 37.5 07/13/2012   MCV 93.8 07/13/2012   PLT 233 07/13/2012   Baby is still breech by bedside u/s.   Janira Mandell A

## 2012-07-15 NOTE — Anesthesia Preprocedure Evaluation (Signed)
Anesthesia Evaluation  Patient identified by MRN, date of birth, ID band Patient awake    Reviewed: Allergy & Precautions, H&P , NPO status , Patient's Chart, lab work & pertinent test results  Airway Mallampati: I TM Distance: >3 FB Neck ROM: full    Dental No notable dental hx.    Pulmonary neg pulmonary ROS,    Pulmonary exam normal       Cardiovascular negative cardio ROS      Neuro/Psych PSYCHIATRIC DISORDERS Anxiety negative neurological ROS     GI/Hepatic Neg liver ROS, GERD-  Medicated and Controlled,  Endo/Other  negative endocrine ROS  Renal/GU negative Renal ROS  negative genitourinary   Musculoskeletal negative musculoskeletal ROS (+)   Abdominal Normal abdominal exam  (+)   Peds negative pediatric ROS (+)  Hematology negative hematology ROS (+)   Anesthesia Other Findings   Reproductive/Obstetrics (+) Pregnancy                           Anesthesia Physical Anesthesia Plan  ASA: II  Anesthesia Plan: Spinal   Post-op Pain Management:    Induction:   Airway Management Planned:   Additional Equipment:   Intra-op Plan:   Post-operative Plan:   Informed Consent: I have reviewed the patients History and Physical, chart, labs and discussed the procedure including the risks, benefits and alternatives for the proposed anesthesia with the patient or authorized representative who has indicated his/her understanding and acceptance.     Plan Discussed with: CRNA and Surgeon  Anesthesia Plan Comments:         Anesthesia Quick Evaluation

## 2012-07-15 NOTE — Anesthesia Procedure Notes (Signed)
Spinal  Patient location during procedure: OR Start time: 07/15/2012 11:03 AM Staffing Anesthesiologist: Brayton Caves R Performed by: anesthesiologist  Preanesthetic Checklist Completed: patient identified, site marked, surgical consent, pre-op evaluation, timeout performed, IV checked, risks and benefits discussed and monitors and equipment checked Spinal Block Patient position: sitting Prep: DuraPrep Patient monitoring: heart rate, cardiac monitor, continuous pulse ox and blood pressure Approach: midline Location: L3-4 Injection technique: single-shot Needle Needle type: Sprotte  Needle gauge: 24 G Needle length: 9 cm Assessment Sensory level: T4 Additional Notes Patient identified.  Risk benefits discussed including failed block, incomplete pain control, headache, nerve damage, paralysis, blood pressure changes, nausea, vomiting, reactions to medication both toxic or allergic, and postpartum back pain.  Patient expressed understanding and wished to proceed.  All questions were answered.  Sterile technique used throughout procedure.  CSF was clear.  No parasthesia or other complications.  Please see nursing notes for vital signs.

## 2012-07-15 NOTE — Anesthesia Postprocedure Evaluation (Signed)
  Anesthesia Post-op Note  Patient: Sabrina Chapman  Procedure(s) Performed: Procedure(s) (LRB) with comments: CESAREAN SECTION (N/A)  Patient Location: Mother/Baby  Anesthesia Type:Spinal  Level of Consciousness: awake, alert  and oriented  Airway and Oxygen Therapy: Patient Spontanous Breathing  Post-op Pain: mild  Post-op Assessment: Post-op Vital signs reviewed, Patient's Cardiovascular Status Stable, No headache, No backache, No residual numbness and No residual motor weakness  Post-op Vital Signs: Reviewed and stable  Complications: No apparent anesthesia complications

## 2012-07-16 ENCOUNTER — Encounter (HOSPITAL_COMMUNITY): Payer: Self-pay | Admitting: Obstetrics and Gynecology

## 2012-07-16 LAB — CBC
HCT: 27.7 % — ABNORMAL LOW (ref 36.0–46.0)
MCHC: 34.3 g/dL (ref 30.0–36.0)
MCV: 94.5 fL (ref 78.0–100.0)
Platelets: 174 10*3/uL (ref 150–400)
RDW: 13.5 % (ref 11.5–15.5)
WBC: 8.7 10*3/uL (ref 4.0–10.5)

## 2012-07-16 MED ORDER — INFLUENZA VIRUS VACC SPLIT PF IM SUSP
0.5000 mL | INTRAMUSCULAR | Status: AC
  Administered 2012-07-17: 0.5 mL via INTRAMUSCULAR

## 2012-07-16 NOTE — Progress Notes (Signed)
  Patient is eating, ambulating, voiding.  Pain control is good.  Filed Vitals:   07/15/12 2105 07/15/12 2305 07/16/12 0256 07/16/12 0655  BP: 124/75 119/71 115/74 128/83  Pulse: 64 70 70 76  Temp: 98.4 F (36.9 C) 98.6 F (37 C) 98.5 F (36.9 C) 98.3 F (36.8 C)  TempSrc: Oral Oral Oral Oral  Resp: 16 16 16 16   Height:      Weight:      SpO2: 98% 97% 97% 98%    lungs:   clear to auscultation cor:    RRR Abdomen:  soft, appropriate tenderness, incisions intact and without erythema or exudate ex:    no cords   Lab Results  Component Value Date   WBC 8.7 07/16/2012   HGB 9.5* 07/16/2012   HCT 27.7* 07/16/2012   MCV 94.5 07/16/2012   PLT 174 07/16/2012    B/Positive/-- (04/04 0000)/RI  A/P    Post operative day 1.  Routine post op and postpartum care.  Expect d/c tomorrow.  Percocet for pain control.

## 2012-07-16 NOTE — Progress Notes (Signed)
Pt's BPs had been elevated in PACU yesterday but are normalized today.

## 2012-07-16 NOTE — Progress Notes (Signed)

## 2012-07-17 MED ORDER — TETANUS-DIPHTH-ACELL PERTUSSIS 5-2.5-18.5 LF-MCG/0.5 IM SUSP: 0.5000 mL | Freq: Once | INTRAMUSCULAR | Status: DC

## 2012-07-18 MED ORDER — IBUPROFEN 600 MG PO TABS: 600.0000 mg | ORAL_TABLET | Freq: Four times a day (QID) | ORAL | Status: DC

## 2012-07-18 MED ORDER — FERROUS SULFATE 325 (65 FE) MG PO TABS: 325.0000 mg | ORAL_TABLET | Freq: Every day | ORAL | Status: DC

## 2012-07-18 MED ORDER — OXYCODONE-ACETAMINOPHEN 5-325 MG PO TABS: 1.0000 | ORAL_TABLET | ORAL | Status: DC | PRN

## 2012-07-18 NOTE — Discharge Summary (Signed)
Obstetric Discharge Summary Reason for Admission: cesarean section Prenatal Procedures: ultrasound - breech presentation Intrapartum Procedures: breech extraction and cesarean: low cervical, transverse Postpartum Procedures: none Complications-Operative and Postpartum: none Hemoglobin  Date Value Range Status  07/16/2012 9.5* 12.0 - 15.0 g/dL Final     HCT  Date Value Range Status  07/16/2012 27.7* 36.0 - 46.0 % Final    Physical Exam:  General: alert and cooperative Lochia: appropriate Uterine Fundus: firm Incision: healing well, no significant drainage, no dehiscence, no significant erythema DVT Evaluation: No evidence of DVT seen on physical exam.  Discharge Diagnoses: Term Pregnancy-delivered  Discharge Information: Date: 07/18/2012 Activity: pelvic rest Diet: routine Medications: PNV, Ibuprofen, Colace, Iron and Percocet Condition: stable Instructions: refer to practice specific booklet Discharge to: home Follow-up Information    Follow up with HORVATH,MICHELLE A, MD. In 2 weeks.   Contact information:   [address] [place] [address] [phone]          Newborn Data: Live born female  Birth Weight: 6 lb 4.4 oz (2845 g) APGAR: 8, 9  Home with mother.  Sabrina Chapman 07/18/2012, 11:37 AM

## 2012-07-20 ENCOUNTER — Ambulatory Visit (HOSPITAL_COMMUNITY)
Admission: RE | Admit: 2012-07-20 | Discharge: 2012-07-20 | Disposition: A | Discharge status: Home / Self Care | Payer: BC Managed Care – PPO | Source: Ambulatory Visit | Attending: Pediatrics | Admitting: Pediatrics

## 2012-07-20 NOTE — Progress Notes (Signed)
Infant Lactation Consultation Outpatient Visit Note  Patient Name: Avary Bessire Date of Birth: [birth year] Birth Weight:   Gestational Age at Delivery: Gestational Age: <None> Type of Delivery:   Breastfeeding History Frequency of Breastfeeding: q 2-3 hours Length of Feeding: 10-30 minutes Voids:  Stools:   Supplementing / Method: Pumping:  Type of Pump: Medela   Frequency: Occassionally  Volume:  The most was 1 oz total  Comments:    Consultation Evaluation: Mom had just been to Ped. Concerned about weight. Ped instructed mom to supplement with 1/2 - 1 oz each feeding of EBM of formula. Assisted mom with syringe/ feeding tube to supplement while at the breast,  Initial Feeding Assessment: Pre-feed Weight: 5- 5.7  2430g Post-feed Weight: 5- 6.5  2452 Amount Transferred: 22 Comments:Assisted mom with syringe/ feeding tube to supplement while at the breast, Baby was fussy before feeding but got up next to breast and went to sleep-had just fed 1 hour ago. Baby took 20 cc's formula from syringe/feeding tube.  Mom states she feels comfortable with supplement while at the breast. Reviewed use and cleaning of pieces. No questions at present. To call prn    Total Breast milk Transferred this Visit: 2 Total Supplement Given: 20  Additional Interventions:   Follow-Up Wants another OP appointment here but we do not have one available until next Monday. Will come to [facility] and weigh baby here. Will call Ped and followup there.      Pamelia Hoit 07/20/2012, 2:00 PM

## 2012-08-02 DEATH — deceased

## 2012-08-06 ENCOUNTER — Ambulatory Visit (HOSPITAL_COMMUNITY)
Admission: RE | Admit: 2012-08-06 | Discharge: 2012-08-06 | Disposition: A | Discharge status: Home / Self Care | Payer: BC Managed Care – PPO | Source: Ambulatory Visit | Attending: Obstetrics and Gynecology | Admitting: Obstetrics and Gynecology

## 2012-08-06 NOTE — Progress Notes (Addendum)
Infant Lactation Consultation Outpatient Visit Note  Patient Name: Sabrina Chapman Date of Birth: Jan 01, 1971 Birth Weight:   Gestational Age at Delivery: Gestational Age: <None> Type of Delivery: C/section Reason for visit today - "Slow weight gain, need to supplement, ( per MD ,early on), decreased milk supply.                                        Per mom has been on fenugreek since 11/30 3 capsules 3 x's per day and has noted an                                        Increase in volume.                                         Mom mentioned life has been stressful due to her mom being Dx with ovarian Ca 2 days                                         After the baby was born ( stress can have an effect on milk supply ).                                         Birth weight - 6-4 oz , Last Dr. Visit weight = 12/1 5-13.5 oz ,  Breastfeeding History Frequency of Breastfeeding: Recently only to breast only a few times a day due to slow weight gain.                                                And frustration due to decreased flow and milk supply  Length of Feeding: 10 -15 mins and then supplement with breast milk and formula  Voids: > 6  Stools: 6-8 yellowish brown   Supplementing / Method: Pumping:  Type of Pump:DEBP Medela    Frequency: 3-4 X's per day   Volume:  Per mom average yield = 1-2 oz total ( both breast combined )   Comments: Per mom "I really don't like to pump "     Consultation Evaluation: Both breast and nipples healthy in appearance, no breakdown noted, mom denies plugged ductsor engorgement challenges.                                               Last feeding prior to consult per mom @ 945am 1 oz EBM  Initial Feeding Assessment: Pre-feed Weight:6-5.9 oz   2888g  Post-feed Weight: 6.6.0 oz 2890g  Amount Transferred:2 ml  Comments: Infant latched well in cross cradle on the left breast ( after LC had mom massage breast  And hand express prior to latch.  Infant latched well , opens wide and  depth was easily obtained, During feeding Using breast compressions, enhanced the let down and infant was able to sustain a good depth for 14 mins.  Infant was content at staying latched.   Additional Feeding Assessment: Right breast _ Football  Pre-feed Weight:6.6.0 oz 2890 g  Post-feed Weight: 6-6.3 oz 2900 g  Amount Transferred:10 ml  Comments: Mom latched infant with assistance with depth.Infant was able to get into a consistent                      pattern with multiply swallows, increased with breast compressions. 10 mins into the feeding infant started getting fussy with flow and released and wouldn't re- relatch. Ended the feeding with the supplement from the bottle.   Additional Feeding Assessment: 3rd feeding was supplement with the bottle due to milk supply being down and infant being frustrated at the breast due to decreased flow.  Amount Transferred:60 ml from a bottle  Comments: fed well on the bottle - per mom at home is using  A "Avent nipple'  Total Breast milk Transferred this Visit: 12ml  Total Supplement Given: ( 60 ml of formula )   Lactation Plan of Care -  Keep of the great efforts with breast feeding and providing breast milk for your baby                                         1) For mom - rest , naps , plenty flds ( esp H20), nutritious snacks and meals                                        2) Working on increasing milk supply - Important "Breast need consistent stimulation                                              8-10 X's per day - Recommendations - Feed @ the breast 1st and supplement after                                                                                                               - If Sabrina Chapman doesn't feed at the breast due to being  To fussy to stay latched , fed her feeding ( appetizer of breast  milk                                                                                                                  Or formula and ) and then latch her to the breast for dessert                                                                                                                - If Sabrina Chapman latches and pulls off due to lack of flow , feed her her whole                                                                                                                   Feeding and then breast for dessert                                         3) Skin to skin feedings has much as possible                                         4) Extra pumping - 4-6x's per day 10-15 mins , especially if Sabrina Chapman doesn't latch 15-20 mins pumping                                         5) Increase calories for Sabrina Chapman as needed - for now range 60 -90 ml ( 2-3 oz )    Follow-Up- 12/12 Thursday at [facility] lactation @10  30 , re assessment of milk supply and feeding assessment                    - Per mom 12/17 Dr. Lyla Son  [facility] / [address]       Kathrin Greathouse 08/06/2012, 10:36 AM

## 2012-08-11 ENCOUNTER — Encounter: Payer: Self-pay | Admitting: Family Medicine

## 2012-08-11 ENCOUNTER — Ambulatory Visit (INDEPENDENT_AMBULATORY_CARE_PROVIDER_SITE_OTHER): Payer: BC Managed Care – PPO | Admitting: Family Medicine

## 2012-08-11 ENCOUNTER — Ambulatory Visit: Payer: BC Managed Care – PPO | Admitting: Family Medicine

## 2012-08-11 VITALS — BP 128/84 | HR 72 | Temp 97.7°F | Ht 66.0 in | Wt 134.0 lb

## 2012-08-11 DIAGNOSIS — J309 Allergic rhinitis, unspecified: Secondary | ICD-10-CM

## 2012-08-11 DIAGNOSIS — J029 Acute pharyngitis, unspecified: Secondary | ICD-10-CM

## 2012-08-11 LAB — POCT RAPID STREP A (OFFICE): Rapid Strep A Screen: NEGATIVE

## 2012-08-11 NOTE — Progress Notes (Signed)
Chief Complaint  Patient presents with  . Sore Throat    x 1 day.   Right sided sore throat since yesterday.  Having some postnasal drainage.  Denies any nasal congestion, but is having a headache above right eye.  Denies fevers.  Unable to expectorate the phlegm.  Denies cough.  Husband has had a cough x 2 months.  No other sick contacts.  +itchy/watery eyes in the last 3-4 days.  4 weeks post-partum.  Breastfeeding. Had some sadness/blues, but is resolving.  Mother recently found to have metastatic ovarian cancer, but getting chemo and spirits are good.  She realizes that since having the baby, she is spending more time at home, exposed to her cat (which she is allergic to).  Past Medical History  Diagnosis Date  . Allergy     seasonal, plus cats, dust  . IBS (irritable bowel syndrome)   . AMA (advanced maternal age) multigravida 35+   . Anxiety in her early 20's    took Paxil  . GERD (gastroesophageal reflux disease)    Past Surgical History  Procedure Date  . Sinus surgery     polyp removal  . Dilation and curettage of uterus 2008  . Cesarean section 07/15/2012    Procedure: CESAREAN SECTION;  Surgeon: Loney Laurence, MD;  Location: [facility];  Service: Obstetrics;  Laterality: N/A;   History   Social History  . Marital Status: Married    Spouse Name: N/A    Number of Children: 1  . Years of Education: N/A   Occupational History  . 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 EXECUTIVE DIRECTOR     Social History Main Topics  . Smoking status: Former Smoker    Quit date: 09/02/1997  . Smokeless tobacco: Never Used  . Alcohol Use: No     Comment: 1-2 drinks every other day when not pregnant  . Drug Use: No  . Sexually Active: Yes    Birth Control/ Protection: Coitus interruptus   Other Topics Concern  . Not on file   Social History Narrative   Married, 1 daughter Sabrina Chapman   Current Outpatient Prescriptions on File Prior to Visit   Medication Sig Dispense Refill  . Prenatal MV-Min-Fe Fum-FA-DHA (PRENATAL 1 PO) Take 1 tablet by mouth.        . ferrous sulfate 325 (65 FE) MG tablet Take 1 tablet (325 mg total) by mouth daily with breakfast.  60 tablet  1  . ibuprofen (ADVIL,MOTRIN) 600 MG tablet Take 1 tablet (600 mg total) by mouth every 6 (six) hours.  60 tablet  1   Allergies  Allergen Reactions  . Citric Acid Other (See Comments)    GI symptoms  . Fructose Other (See Comments)    Lower G.I. symptoms   ROS:  Denies nausea, vomiting, diarrhea, skin rash. Denies cough, shortness of breath, wheezing, bleeding, bruising.  Moods are improved.  See HPI  PHYSICAL EXAM: BP 128/84  Pulse 72  Temp 97.7 F (36.5 C) (Oral)  Ht 5\' 6"  (1.676 m)  Wt 134 lb (60.782 kg)  BMI 21.63 kg/m2  Breastfeeding? Yes Well developed, pleasant female, in no distress.  Accompanied by her 29 month old sleeping infant daughter HEENT:  PERRL, EOMI, conjunctiva clear.  TM's and EAC's normal.  Sinuses nontender. Nasal mucosa moderately edematous, pale, with clear mucus. OP: slight cobblestoning posteriorly, and very mild erythema L posterior tonsillar pillar.  Tonsils are not enlarged, no exudate. Neck: no lymphadenopathy,  thyromegaly or mass Heart: regular rate and rhythm without murmur Lungs: clear bilaterally Skin: no rash Psych: normal mood, affect, hygiene and grooming  Rapid strep negative  ASSESSMENT/PLAN:  1. Sore throat  Rapid Strep A  2. Allergic rhinitis, cause unspecified     Pharyngitis is most likely related to allergies (most likely from her cat, vs dust possibly related to x-mas decorations/storage, etc), but cannot completely exclude URI, given that today is first day of symptoms.  Might get worse if URI rather than allergies.  Given that she is breast-feeding, discussed supportive measures including sinus rinses, rather than just OTC medications (which might affect her breastmilk supply).  Reviewed safety issues with OTC  meds and nursing.  Discussed hygiene measures to prevent spread of infection (if viral, not contagious if allergies)--as related to her infant daughter, and mother on chemotherapy.  All questions answered.  Call for ABX if symptoms develop into sinus infection (reviewed)

## 2012-08-11 NOTE — Patient Instructions (Addendum)
There is no evidence of bacterial infection.  You likely have a component of allergies, but I can't completely exclude an early cold/virus coming on.  Try and keep your cat away, if you have some known cat allergies.  You can use topical eye allergy medications if needed.  Start Netipot/sinus rinses again.  Call if you develop fevers, worsening sinus pain, or discolored mucus/phlegm  Drink plenty of fluids.

## 2012-08-13 ENCOUNTER — Ambulatory Visit (HOSPITAL_COMMUNITY): Payer: BC Managed Care – PPO

## 2012-08-14 NOTE — H&P (Signed)
Patient presented to [facility] for External Cephalic Version.   Attempted version failed. Patient spent about 1 hour on [employer].

## 2012-09-14 NOTE — Discharge Summary (Signed)
Discharge Diagnosis:  Failed version Patient admitted to birthing suites for external cephalic version.  Version attempt failed.  Patient spent 1 hour on birthing suites.

## 2012-11-25 ENCOUNTER — Other Ambulatory Visit (INDEPENDENT_AMBULATORY_CARE_PROVIDER_SITE_OTHER): Payer: BC Managed Care – PPO

## 2012-11-25 DIAGNOSIS — Z23 Encounter for immunization: Secondary | ICD-10-CM

## 2012-11-25 DIAGNOSIS — Z111 Encounter for screening for respiratory tuberculosis: Secondary | ICD-10-CM

## 2013-03-10 ENCOUNTER — Encounter: Payer: Self-pay | Admitting: Medical

## 2013-03-10 ENCOUNTER — Ambulatory Visit (INDEPENDENT_AMBULATORY_CARE_PROVIDER_SITE_OTHER): Payer: BC Managed Care – PPO | Admitting: Medical

## 2013-03-10 ENCOUNTER — Telehealth: Payer: Self-pay | Admitting: Internal Medicine

## 2013-03-10 VITALS — BP 110/80 | HR 76 | Temp 97.7°F | Resp 16 | Wt 119.0 lb

## 2013-03-10 DIAGNOSIS — J329 Chronic sinusitis, unspecified: Secondary | ICD-10-CM

## 2013-03-10 MED ORDER — AMOXICILLIN 875 MG PO TABS: 875.0000 mg | ORAL_TABLET | Freq: Two times a day (BID) | ORAL | Status: DC

## 2013-03-10 NOTE — Progress Notes (Signed)
Subjective:  Sabrina Chapman is a 42 y.o. female who presents for 1.5-2 wk of sinus headache, dry cough, mucous production - yellow green, nasal congestion and drainage, mild sore throat.   No NVD, no fever, no chills, no ear pressure.  Past history is significant for prior sinus infections, but not frequent. Patient is a non-smoker.  Using EchoStar, Nyquil, and Robitussin for symptoms.  +sick contacts - daughter in day care.  No other aggravating or relieving factors.  No other c/o.   Past Medical History  Diagnosis Date  . Allergy     seasonal, plus cats, dust  . IBS (irritable bowel syndrome)   . AMA (advanced maternal age) multigravida 35+   . Anxiety in her early 20's    took Paxil  . GERD (gastroesophageal reflux disease)     Objective: Filed Vitals:   03/10/13 0912  BP: 110/80  Pulse: 76  Temp: 97.7 F (36.5 C)  Resp: 16    General appearance: Alert, WD/WN, no distress, fatigue appearing                             Skin: warm, no rash                           Head: no sinus tenderness                            Eyes: conjunctiva normal, corneas clear, PERRLA                            Ears: pearly TMs, external ear canals normal                          Nose: septum midline, turbinates bilat swollen with erythema and some clear to purulent discharge             Mouth/throat: MMM, tongue normal, no pharyngeal erythema                           Neck: supple, no adenopathy, no thyromegaly, nontender                          Heart: RRR, normal S1, S2, no murmurs                         Lungs: CTA bilaterally, no wheezes, rales, or rhonchi       Assessment and Plan:   Encounter Diagnosis  Name Primary?  . Sinusitis Yes   Prescription given for Amoxicillin.  Can use OTC Amoxicillin for congestion.  Tylenol or Ibuprofen OTC for fever and malaise.  Discussed symptomatic relief, nasal saline, and call or return if worse or not improving in 2-3 days.

## 2013-03-10 NOTE — Telephone Encounter (Signed)
Pt was at check out just now and states she didn't want to drive across town to get her med so she wanted a pharmacy close by. Called and cancel the orignal med that was sent to [facility] and send to [facility]

## 2013-09-15 ENCOUNTER — Other Ambulatory Visit (INDEPENDENT_AMBULATORY_CARE_PROVIDER_SITE_OTHER): Payer: BC Managed Care – PPO

## 2013-09-15 DIAGNOSIS — Z23 Encounter for immunization: Secondary | ICD-10-CM

## 2013-11-08 ENCOUNTER — Ambulatory Visit (INDEPENDENT_AMBULATORY_CARE_PROVIDER_SITE_OTHER): Payer: BC Managed Care – PPO | Admitting: Family Medicine

## 2013-11-08 ENCOUNTER — Encounter: Payer: Self-pay | Admitting: Family Medicine

## 2013-11-08 VITALS — BP 130/80 | HR 72 | Ht 66.0 in | Wt 125.0 lb

## 2013-11-08 DIAGNOSIS — H8110 Benign paroxysmal vertigo, unspecified ear: Secondary | ICD-10-CM

## 2013-11-08 DIAGNOSIS — J309 Allergic rhinitis, unspecified: Secondary | ICD-10-CM

## 2013-11-08 NOTE — Progress Notes (Signed)
Chief Complaint  Patient presents with  . Dizziness    since Feb 28th. Dizziness is constant, better when laying down. Worst when she gets up quickly. Sunday the 1st of March she barely got up the entire day, has felt a little better since then. Has had a dull headache ever since. Is taking Femara-took days 3-7 days of her cycle. This week, probably will do an injectable fertility drug(unsure of name).     Dizziness is described as vertigo.  She feels like she is spinning (rather than the room).  It is worse when she is lying down.  She is also having frontal headaches, feels better if she presses on the center of her forehead.  Denies any allergy symptoms.  No recent URI.  Denies ear pain, pressure, loss of hearing. Denies any other neurologic symptoms--numbness, tingling, weakness, memory or speech problems.   On fertility treatment (using same procedure that was effective for her last time).  She is on the second round (first wasn't effective). Her daughter is 6615 months old.   Past Medical History  Diagnosis Date  . Allergy     seasonal, plus cats, dust  . IBS (irritable bowel syndrome)   . AMA (advanced maternal age) multigravida 35+   . Anxiety in her early 20's    took Paxil  . GERD (gastroesophageal reflux disease)    Past Surgical History  Procedure Laterality Date  . Sinus surgery      polyp removal  . Dilation and curettage of uterus  2008  . Cesarean section  07/15/2012    Procedure: CESAREAN SECTION;  Surgeon: Loney LaurenceMichelle A Horvath, MD;  Location: [facility];  Service: Obstetrics;  Laterality: N/A;   History   Social History  . Marital Status: Married    Spouse Name: N/A    Number of Children: 1  . Years of Education: N/A   Occupational History  . 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 EXECUTIVE DIRECTOR     Social History Main Topics  . Smoking status: Former Smoker    Quit date: 09/02/1997  . Smokeless tobacco: Never Used  . Alcohol Use:  No     Comment: 1-2 drinks every other day when not pregnant  . Drug Use: No  . Sexual Activity: Yes    Birth Control/ Protection: Coitus interruptus   Other Topics Concern  . Not on file   Social History Narrative   Married, 1 daughter [name]   Outpatient Encounter Prescriptions as of 11/08/2013  Medication Sig  . Ibuprofen (ADVIL MIGRAINE PO) Take by mouth.  Marland Kitchen. ibuprofen (ADVIL,MOTRIN) 600 MG tablet Take 1 tablet (600 mg total) by mouth every 6 (six) hours.  . Prenatal MV-Min-Fe Fum-FA-DHA (PRENATAL 1 PO) Take 1 tablet by mouth.    . [DISCONTINUED] amoxicillin (AMOXIL) 875 MG tablet Take 1 tablet (875 mg total) by mouth 2 (two) times daily.  . [DISCONTINUED] ferrous sulfate 325 (65 FE) MG tablet Take 1 tablet (325 mg total) by mouth daily with breakfast.   Allergies  Allergen Reactions  . Citric Acid Other (See Comments)    GI symptoms  . Fructose Other (See Comments)    Lower G.I. symptoms    ROS: BP 130/80  Pulse 72  Ht 5\' 6"  (1.676 m)  Wt 125 lb (56.7 kg)  BMI 20.19 kg/m2  LMP 10/27/2013  Breastfeeding? No Well developed, pleasant female in no distress She has very mild vertigo when going from lying to sitting with her  head turned to right, none when sitting up with head to right. Significant vertigo when lying down with head turned to the left, lasted about 10 seconds. No nystagmus noted, able to keep eyes open. Milder upon sitting up with head to the left. No dizziness with head straight forward when lying/sitting. Cranial nerves 2-12 intact.  Normal DTR's, strength, sensation.  No pronator drift. Normal finger to nose and rapid alternating movement.  Normal tandem gait.  Negative Romberg. HEENT:  PERRL, EOMI, conjunctiva clear.  TM's and EAC's normal.  OP clear. Nasal mucosa is moderately to severely edematous with clear mucus and yellow crust. Sinuses nontender Neck: no significant lymphadenopathy, no thyromegaly or mass Heart: regular rate and rhythm, no  murmur Lungs: clear bilaterally Skin: no rash Psych: normal mood, affect, hygiene and grooming  ASSESSMENT/PLAN:  BPV (benign positional vertigo)  Allergic rhinitis, cause unspecified  Vertigo--positional component, worse with head turned to left. Discussed referring to PT/vestibular rehab--prefers to wait, and go to PT if not gradually improving on its own, using meclizine as needed.  Patient will call when ready for referral.  Likely has some component of allergies contributing to her frontal sinus pressure.  Use nasal saline spray to keep your nose moist.  Restart neti-pot.  Use an antihistamine such as claritin or zyrtec as needed for allergies if still having significant congestion, sinus headaches, and ongoing dizziness.  Risks/side effects of meds reviewed. Differential diagnosis reviewed

## 2013-11-08 NOTE — Patient Instructions (Signed)
Refer to PT/vestibular rehab if not gradually improving, using meclizine as needed.  Call when ready for referral.  Meclizine is over-the-counter (call Bonine, amongst other brand names).  12.5 and 25mg  doses are available.  It can be taken 3 times daily, if needed for vertigo  Likely has some component of allergies contributing to her frontal sinus pressure.  Use nasal saline spray to keep your nose moist.  Restart neti-pot.  Use an antihistamine such as claritin or zyrtec as needed for allergies if still having significant congestion, sinus headaches, and ongoing dizziness.  Benign Positional Vertigo Vertigo means you feel like you or your surroundings are moving when they are not. Benign positional vertigo is the most common form of vertigo. Benign means that the cause of your condition is not serious. Benign positional vertigo is more common in older adults. CAUSES  Benign positional vertigo is the result of an upset in the labyrinth system. This is an area in the middle ear that helps control your balance. This may be caused by a viral infection, head injury, or repetitive motion. However, often no specific cause is found. SYMPTOMS  Symptoms of benign positional vertigo occur when you move your head or eyes in different directions. Some of the symptoms may include:  Loss of balance and falls.  Vomiting.  Blurred vision.  Dizziness.  Nausea.  Involuntary eye movements (nystagmus). DIAGNOSIS  Benign positional vertigo is usually diagnosed by physical exam. If the specific cause of your benign positional vertigo is unknown, your caregiver may perform imaging tests, such as magnetic resonance imaging (MRI) or computed tomography (CT). TREATMENT  Your caregiver may recommend movements or procedures to correct the benign positional vertigo. Medicines such as meclizine, benzodiazepines, and medicines for nausea may be used to treat your symptoms. In rare cases, if your symptoms are caused  by certain conditions that affect the inner ear, you may need surgery. HOME CARE INSTRUCTIONS   Follow your caregiver's instructions.  Move slowly. Do not make sudden body or head movements.  Avoid driving.  Avoid operating heavy machinery.  Avoid performing any tasks that would be dangerous to you or others during a vertigo episode.  Drink enough fluids to keep your urine clear or pale yellow. SEEK IMMEDIATE MEDICAL CARE IF:   You develop problems with walking, weakness, numbness, or using your arms, hands, or legs.  You have difficulty speaking.  You develop severe headaches.  Your nausea or vomiting continues or gets worse.  You develop visual changes.  Your family or friends notice any behavioral changes.  Your condition gets worse.  You have a fever.  You develop a stiff neck or sensitivity to light. MAKE SURE YOU:   Understand these instructions.  Will watch your condition.  Will get help right away if you are not doing well or get worse. Document Released: 05/27/2006 Document Revised: 11/11/2011 Document Reviewed: 05/09/2011 [facility] Patient Information 2014 [employer] [place], [place].

## 2013-11-09 ENCOUNTER — Telehealth: Payer: Self-pay | Admitting: Internal Medicine

## 2013-11-09 DIAGNOSIS — H8110 Benign paroxysmal vertigo, unspecified ear: Secondary | ICD-10-CM

## 2013-11-09 NOTE — Telephone Encounter (Signed)
I put in referral for PT, but I'm not sure that it is correct. Please call and verify, and change if necessary. I'm not sure if it needs to be put in as neuro rehab, or vestibular rehab.  Diagnosis is for BPV.  [facility] but I think they might have a particular way they need the order entered.  Please look into this for pt,and get scheduled as soon as you can for this pt with vertigo.   Thanks

## 2013-11-09 NOTE — Telephone Encounter (Signed)
Pt is not getting any better and would like to go to PT. Please referral pt. She knows that you are not here today

## 2013-11-09 NOTE — Telephone Encounter (Signed)
I have called  neuro rehab for pt to bet set up with them for PT. They will call pt now and schedule that appt. Phone # [phone]

## 2014-02-09 ENCOUNTER — Encounter (HOSPITAL_COMMUNITY): Payer: Self-pay | Admitting: Obstetrics and Gynecology

## 2014-02-15 ENCOUNTER — Ambulatory Visit (HOSPITAL_COMMUNITY)
Admission: RE | Admit: 2014-02-15 | Discharge: 2014-02-15 | Disposition: A | Discharge status: Home / Self Care | Payer: BC Managed Care – PPO | Source: Ambulatory Visit | Attending: Obstetrics and Gynecology | Admitting: Obstetrics and Gynecology

## 2014-02-15 ENCOUNTER — Other Ambulatory Visit (HOSPITAL_COMMUNITY): Payer: Self-pay | Admitting: Maternal and Fetal Medicine

## 2014-02-15 DIAGNOSIS — O09529 Supervision of elderly multigravida, unspecified trimester: Secondary | ICD-10-CM

## 2014-02-17 ENCOUNTER — Telehealth (HOSPITAL_COMMUNITY): Payer: Self-pay | Admitting: MS"

## 2014-02-17 NOTE — Telephone Encounter (Signed)
Patient had previously asked how many [facility]S procedures our provider had performed. Called [name] and left message that Dr. Otho PerlNitsche stated that he has done over 50 [facility]S procedures at this point. Encouraged patient to call back if she has any questions or wanted to further discuss any information.   [name] 02/17/2014 10:25 AM

## 2014-02-17 NOTE — Progress Notes (Signed)
Genetic Counseling  High-Risk Gestation Note  Appointment Date:  02/15/2014 Referred By: Sabrina LaurenceHorvath, Michelle Chapman, Chapman Date of Birth:  11/18/1970 Partner:  Sabrina Chapman   Pregnancy History: [id number] Estimated Date of Delivery: 09/30/14 Estimated Gestational Age: 6635w4d Attending: Particia NearingMartha Decker, Chapman   Sabrina Chapman and her husband, Sabrina Chapman, were seen for genetic counseling because of Chapman maternal age of [age] y.o.Marland Kitchen.     They were counseled regarding maternal age and the association with risk for chromosome conditions due to nondisjunction with aging of the ova.   We reviewed chromosomes, nondisjunction, and the associated 1 in 2817 risk for fetal aneuploidy related to Chapman maternal age of 43 y.o. at 6935w4d gestation.  They were counseled that the risk for aneuploidy decreases as gestational age increases, accounting for those pregnancies which spontaneously abort.  We specifically discussed Down syndrome (trisomy 1121), trisomies 6613 and 8018, and sex chromosome aneuploidies (47,XXX and 47,XXY) including the common features and prognoses of each.   We reviewed available screening options including First Screen, noninvasive prenatal screening (NIPS)/cell free DNA (cfDNA) testing, and detailed ultrasound.  They were counseled that screening tests are used to modify Chapman patient's Chapman priori risk for aneuploidy, typically based on age. This estimate provides Chapman pregnancy specific risk assessment. We reviewed the benefits and limitations of each option. Specifically, we discussed the conditions for which each test screens, the detection rates, and false positive rates of each. They were also counseled regarding diagnostic testing via chorionic villus sampling (CVS) (performed approximately 10-[redacted] weeks gestation) and amniocentesis (performed after [redacted] weeks gestation). We discussed the risks, benefits, and limitations of these testing options. We reviewed the approximate 1 in 100 risk for complications for CVS and the  approximate 1 in 300-500 risk for complications for amniocentesis, including spontaneous pregnancy loss for each. We also discussed the chance for maternal cell contamination and confined placental mosaicism with CVS. The couple understands that these screens and tests, including CVS and amniocentesis, do not diagnose or rule out all birth defects or genetic conditions. After consideration of all the options, they elected to proceed with CVS, which is scheduled with Sabrina Chapman on Friday, 03/11/14.  Sabrina Chapman stated that her initial OB visit is planned for 03/03/14, at which time she will have initial OB blood work and dating ultrasound performed.   Sabrina Chapman was provided with written information regarding cystic fibrosis (CF) including the carrier frequency and incidence in the Caucasian population, the availability of carrier testing and prenatal diagnosis if indicated.  In addition, we discussed that CF is routinely screened for as part of the [place] newborn screening panel.  Sabrina Chapman [facility], during her previous pregnancy, which was negative for the mutations screened. Thus, her risk to be Chapman CF carrier has been reduced.   Both family histories were reviewed and found to be contributory for cerebral palsy for [name]' niece. She was reportedly born preterm and died at age 42 years old. We discussed that cerebral palsy is not thought to have Chapman genetic component and that Chapman family history of cerebral palsy would not be expected to increase recurrence risk for relatives, in the case that the diagnosis is correct.   Additionally, the patient reported her paternal grandmother had cleft palate. She did not have surgical correction and had no additional health problems. Chapman specific cause is unknown for her cleft palate. No  additional relatives were reported with cleft palate. The incidence of isolated  cleft palate is estimated to be 1 in 2,500, and the incidence of cleft lip with or without cleft palate is approximately 1 in 1,000, varying with ethnicity. Cleft palate is most often an isolated condition, but can be present as one feature of an underlying genetic condition in combination with other birth defects or features. Clefting can also be associated with maternal environmental exposures during pregnancy. When there is no syndrome or known underlying factor as the cause, multifactorial inheritance is suspected involving Chapman combination of genetic and environmental contributing factors. In the case of multifactorial inheritance, given the reported family history of Chapman third degree relative to the pregnancy born with apparently isolated cleft palate, recurrence risk for isolated cleft palate in the current pregnancy is estimated to be close to the general population risk. If the relative's cleft palate were due to Chapman specific underlying cause, this recurrence risk estimate may change. Chapman second trimester targeted ultrasound may detect facial clefts. However, it is important to remember that not all clefting can be detected prenatally, and isolated cleft palate is difficult to detect on prenatal ultrasound.  Sabrina Chapman reported Chapman history of breast cancer for her mother and maternal grandmother and Chapman history of ovarian cancer for her mother and paternal grandmother. Though most cancers are thought to be sporadic or due to environmental factors, some families appear to have Chapman strong predisposition to cancers.  When considering Chapman family history of cancer, we look for common types of cancer in multiple family members occurring at younger than typical ages. we discussed the option of meeting with Chapman cancer genetic counselor to discuss any possible screening or testing options available. If they are concerned about the family history of cancer and would like to learn more about the family's chance for an  inherited cancer syndrome/additional possible testing options, her doctor may [facility] [phone]). Sabrina Chapman stated that she previously had testing for the breast cancer susceptibility genes, which was normal. Without further information regarding the provided family history, an accurate genetic risk cannot be calculated. Further genetic counseling is warranted if more information is obtained.  Sabrina Chapman denied exposure to environmental toxins or chemical agents. She denied the use of alcohol, tobacco or street drugs. She denied significant viral illnesses during the course of her pregnancy. Her medical and surgical histories were noncontributory.   I counseled this couple regarding the above risks and available options.  The approximate face-to-face time with the genetic counselor was 40 minutes.  Quinn PlowmanKaren Corneliussen, MS,  Certified Genetic Counselor 02/17/2014

## 2014-03-03 ENCOUNTER — Other Ambulatory Visit: Payer: Self-pay

## 2014-03-03 ENCOUNTER — Other Ambulatory Visit: Payer: Self-pay | Admitting: Obstetrics and Gynecology

## 2014-03-07 LAB — CYTOLOGY - PAP

## 2014-03-11 ENCOUNTER — Ambulatory Visit (HOSPITAL_COMMUNITY): Payer: BC Managed Care – PPO

## 2014-03-11 ENCOUNTER — Other Ambulatory Visit (HOSPITAL_COMMUNITY): Payer: BC Managed Care – PPO

## 2014-03-17 ENCOUNTER — Other Ambulatory Visit: Payer: Self-pay

## 2014-03-18 ENCOUNTER — Other Ambulatory Visit: Payer: Self-pay

## 2014-03-18 ENCOUNTER — Telehealth (HOSPITAL_COMMUNITY): Payer: Self-pay | Admitting: MS"

## 2014-03-18 NOTE — Telephone Encounter (Signed)
Called Sabrina Chapman and her husband to discuss the preliminary FISH results from her CVS.  We reviewed that these are within normal limits.  We again discussed the limitations of FISH and that final results are still pending and will be available in 1-2 weeks.  Patient reported that she has not noticed any concerning symptoms or complications following the procedure.  All questions were answered to her satisfaction, she was encouraged to call with additional questions or concerns.  Quinn PlowmanKaren Lamona Eimer, MS Patent attorneyCertified Genetic Counselor

## 2014-03-28 ENCOUNTER — Telehealth (HOSPITAL_COMMUNITY): Payer: Self-pay | Admitting: MS"

## 2014-03-28 NOTE — Telephone Encounter (Signed)
Called Sabrina Chapman to discuss the final karyotype results from her CVS. Patient was identified by name and DOB. We reviewed that these are within normal limits.  She understands that this does not test for all genetic conditions or birth defects. [name] requested a copy of the results be mailed to her.  All questions were answered to her satisfaction, she was encouraged to call with additional questions or concerns.  Quinn PlowmanKaren Leilene Diprima, MS Certified Genetic Counselor 03/28/2014 10:40 AM

## 2014-06-02 ENCOUNTER — Telehealth (HOSPITAL_COMMUNITY): Payer: Self-pay | Admitting: MS"

## 2014-06-02 ENCOUNTER — Other Ambulatory Visit (HOSPITAL_COMMUNITY): Payer: Self-pay | Admitting: Obstetrics and Gynecology

## 2014-06-02 DIAGNOSIS — O09522 Supervision of elderly multigravida, second trimester: Secondary | ICD-10-CM

## 2014-06-02 NOTE — Telephone Encounter (Signed)
Left message for patient to return call to discuss recent referral from her OB office.   Sabrina Chapman 06/02/2014 3:14 PM

## 2014-06-02 NOTE — Telephone Encounter (Signed)
[name] returned my call. She reviewed that her ultrasound at 20 weeks and [redacted] weeks gestation (which was yesterday) at her OB office revealed female genitalia. 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 She reviewed that there was vanishing twin earlier in the pregnancy, which was known prior to the CVS. Discussed with [name] that her appointment is currently scheduled with us next Friday, 10/09, given that ultrasound would also be helpful in this situation and that limited when the appointment could be scheduled. However, I understand that they would likely want to come in prior to this time. She expressed interest in being seen prior to 10/09, partially because they have a follow-up OB visit on that date. Briefly discussed possible explanations for the discrepant results including twin B's placenta being sampled for CVS, maternal cell contamination, or confined placental mosaicism. Also discussed possibility of relatively rare underlying genetic causes. Reviewed follow-up testing options of cell free DNA testing. Briefly discussed the limitations of this, given that it samples cell free DNA from the placenta and that this would typically be the same source of the sample that was tested on CVS. Also reviewed option of amniocentesis, given that the cells sampled are fetal in origin. We reviewed risks, benefits, and limitations of amniocentesis including the associated approximate 1 in 400 risk for complications to the pregnancy, including spontaneous pregnancy loss or preterm labor and delivery, depending upon gestational age. The patient stated that she and her husband are interested in obtaining additional information for planning purposes, but not to take any different course of action in the pregnancy. Reviewed that we can discuss each of these in more detail in person. I planned to review options for adding in the ultrasound at an earlier date and to call her back with a new scheduled time.   Sabrina Chapman  Sabrina Chapman 06/02/2014 4:19 PM

## 2014-06-02 NOTE — Telephone Encounter (Signed)
Left message for patient that we could work her in for ultrasound tomorrow at 1:00 pm. Asked her to return call and leave message at [phone] regarding whether or not this would work.   Clydie BraunKaren Ruhaan Nordahl  06/02/2014 4:45 PM

## 2014-06-09 ENCOUNTER — Ambulatory Visit (HOSPITAL_COMMUNITY)
Admission: RE | Admit: 2014-06-09 | Discharge: 2014-06-09 | Disposition: A | Discharge status: Home / Self Care | Payer: BC Managed Care – PPO | Source: Ambulatory Visit | Attending: Obstetrics and Gynecology | Admitting: Obstetrics and Gynecology

## 2014-06-09 ENCOUNTER — Encounter (HOSPITAL_COMMUNITY): Payer: Self-pay

## 2014-06-09 DIAGNOSIS — Z315 Encounter for genetic counseling: Secondary | ICD-10-CM | POA: Insufficient documentation

## 2014-06-09 DIAGNOSIS — O09522 Supervision of elderly multigravida, second trimester: Secondary | ICD-10-CM

## 2014-06-09 DIAGNOSIS — O359XX0 Maternal care for (suspected) fetal abnormality and damage, unspecified, not applicable or unspecified: Secondary | ICD-10-CM

## 2014-06-09 NOTE — Progress Notes (Addendum)
Genetic Counseling  High-Risk Gestation Note  Appointment Date:  06/09/2014 Referred By: Daria Pastures, MD Date of Birth:  June 01, 1971    Pregnancy History: G2P1001 Estimated Date of Delivery: 09/30/14 Estimated Gestational Age: 91w6dAttending: JViann Fish MD   I met with Mrs. Sabrina Tomassettiand her husband for genetic counseling because of the ultrasound finding of phenotypic female fetus following female karyotype from CVS in the pregnancy.  [name] had chorionic villus sampling performed on 03/17/14 and karyotype analysis was reported as 481 XX. Early ultrasound at her OB office on 03/03/14 at [redacted] weeks gestation reported the presence of a dichorionic/diamniotic twin gestation, with demise of twin B noted, measuring approximately 753w1destation. By ultrasound performed at her OB office on 05/03/14 and 06/01/14, fetus appears to be female.   A complete ultrasound was performed today.  The report will be documented separately.   There were no visualized fetal anomalies or markers for aneuploidy.  The fetus appeared phenotypically female.   We reviewed possibilities for the discrepancy in karyotype result from CVS and phenotype appearance on ultrasound. Given that the pregnancy was a dichorionic/diamniotic gestation with demise of twin B at approximately [redacted] weeks gestation, it is possible that the discrepant results may be due to sampling of the placenta of twin B, which may have been consistent with a female karyotype. If this was the case, then screening and testing for fetal aneuploidy would still be available to the current pregnancy, given that the analysis previously performed would not have been from the current fetus. Alternatively, we discussed the possibility of maternal cell contamination from the CVS sample as the explanation for the discrepancy in karyotype and phenotypic fetal sex. Maternal cell contamination studies were not performed for  [facility], given [facility] based on the growth of the cell culture. We discussed that there is currently not sample remaining from the CVS, and thus, maternal cell contamination studies cannot be performed at this time. We also discussed the possibility of confined placental mosaicism as the explanation for the discrepant CVS karyotype and fetal sex phenotype. We reviewed possible underlying mosaic cell lines that could result in this discrepancy including 46, XX/46,XY or 46,XX/47,XXY.   In addition, we discussed the less likely possibility of a fetal disorder of sex development (DSD).  This is a term that is used to replace previously used terms such as sex reversal, pseudohermaphroditism, and intersex.  They were counseled that female sexual determination is initiated by Y chromosomal SRY, which activates a cascade of genes that lead the embryonic gonad to develop into a testis. Significant genetic heterogeneity is observed in DSD.  Nonsyndromic 46,XX testicular disorders of sex development are characterized by female external genitalia and 46,XX karyotype. This typically results from unequal crossover of the Y and X chromosomes (typically de novo but sometimes inherited), resulting in the presence of Y-linked gene, SRY, being present on the X chromosome. Less commonly, this can be caused by copy number variants of autosomal gene SOX9 or X-linked gene SOX3. Additionally, we discussed that virilization of female fetuses can occur in congenital adrenal hyperplasia (CAH), which is an autosomal recessive disorder involving impaired synthesis of cortisol from cholesterol. 21-hydroxylase deficiency (21-OHD) is the most common cause of CAH. The classic form of 21-OHD CAH results in prenatal onset of virilization. Diagnosis is typically made by measuring 17-OHP or by molecular genetic testing of CYP21A2.  We discussed that CAH is included  on the newborn screening panel for babies born in the hospital  in [place]. Given the significant genetic heterogeneity, we discussed that it is often difficult to determine a specific cause for a prenatally detected DSD.    We also discussed that virilization of a karyotypic female in pregnancy can occur as a result of maternal exposure to androgens. [name] denied exposures in the current pregnancy that would be associated with this increased risk.   We discussed follow-up testing and screening options. Regarding screening, we discussed noninvasive prenatal screening (NIPS)/cell free DNA testing (cfDNA). We discussed that NIPS is primarily a screen for fetal aneuploidy and reviewed the conditions for which it screens including the detection rates and false positive rates. We reviewed that NIPS assesses cell free DNA that is placental in origin and that fetal sex determination by NIPS is performed by looking for the presence or absence of Y chromosome material.  If the karyotype from CVS was truly from the second embryonic sac, which is no longer visualized, those cells should have cleared from maternal circulation and the placental cell free DNA which would be detected by NIPS should be expected to be originating from the current singleton gestation. However, in the case that the CVS results are representative of confined placental mosaicism, NIPS would not be expected to provide significant additional information from the CVS. The couple understands that NIPS does not assess for single gene conditions and would not specifically assess for fetal disorders of sex development.   We also discussed the option of amniocentesis including the risks, benefits, and limitations of this procedure.  We discussed the associated 1 in 782-956 risk for complications, including spontaneous preterm labor and delivery. They understand that cells derived from amniocentesis can be used for fetal chromosome analysis, including sex chromosome analysis and microarray  analysis.  We discussed the karyotype from amniocentesis would be considered diagnostic regarding the karyotype in the current pregnancy and could further help clarify if the CVS result was discrepant from the ultrasound results due to placenta B being sample, confined placental mosaicism, or maternal cell contamination. Additionally, we discussed that in the case that karyotype from amniocentesis was consistent with 30, XX, cells could be assessed for the presence of SRY. CAH could also be assessed from amniocentesis by measuring 17-OHP. The couple understands that amniocentesis typically does not assess for single gene conditions, unless ultrasound or family history are suggestive of particular conditions. Amniocentesis also would not assess for all potential causes of disorders of sex development. The couple understands that amniocentesis does not assess for all birth defects or genetic conditions. [name] declined amniocentesis given the associated risk of complications and her current gestational age.  After thoughtful consideration of these options, this couple elected to proceed with NIPS (Panorama).  Results will be available in approximately 7-10 days.  The couple expressed understands that screening does not diagnose or rule out all birth defects or genetic conditions.    I counseled this couple regarding the above risks and available options.  The approximate face-to-face time with the genetic counselor was 25 minutes.  Chipper Oman, MS Certified Genetic Counselor 06/09/2014

## 2014-06-10 ENCOUNTER — Ambulatory Visit (HOSPITAL_COMMUNITY): Payer: BC Managed Care – PPO

## 2014-06-10 ENCOUNTER — Ambulatory Visit (HOSPITAL_COMMUNITY): Admission: RE | Admit: 2014-06-10 | Payer: BC Managed Care – PPO | Source: Ambulatory Visit

## 2014-06-16 ENCOUNTER — Telehealth (HOSPITAL_COMMUNITY): Payer: Self-pay | Admitting: MS"

## 2014-06-16 NOTE — Telephone Encounter (Signed)
Spoke with Dr. 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 Patient is aware of results.   Clydie BraunKaren Renold Kozar 06/16/2014 3:33 PM

## 2014-06-16 NOTE — Telephone Encounter (Signed)
Spoke with [name] and her husband, [name], via telephone regarding her cell free fetal DNA test results.  [name] had Panorama testing through [facility].  Testing was offered because of advanced maternal age and discrepant fetal sex on ultrasound and previous CVS.   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 We reviewed that this is not diagnostic for this condition. The positive predictive value for Klinefelter syndrome in the current pregnancy is estimated to be 98%. The test indicated low risk for trisomies 21, 18, and 13 (< 1 in 10,000) for each.    We reviewed that the cell free DNA test can not distinguish between aneuploidy confined to the placenta, fetal XXY, or mosaicism (fetal or placental). Discussed possible explanations for previous CVS result of 2446, XX karyotype may include mosaicism in the placenta, which may or may not also be in the fetus. Alternatively, the placenta from twin B may have been sampled from the CVS, as previously discussed.   Considering the possibility of Klinefelter syndrome, they were counseled in detail regarding this diagnosis. Klinefelter syndrome is the most common sex chromosome condition, with a prevalence of approximately 1 in 500 males. Klinefelter syndrome is most commonly ascertained either by prenatal diagnosis, or during a work-up for delayed puberty or infertility. We discussed that the features of Klinefelter are variable and are age-dependent. Newborns with Klinefelter syndrome have relatively few, if any distinguishing characteristics. Characteristics in childhood and into adulthood may include tall stature, testicular failure, azoospermia, and lack of pubertal virilization. Individuals with 47,XXY typically have a unique cognitive and behavioral profile, which may include an increased risk of learning disabilities (dyslexia  and attention-deficit hyperactivity disorder), differences in language development, and social/emotional problems. We discussed that early intervention services can be effective in reducing many of these issues. We discussed that males with Klinefelter syndrome are often referred to subspecialists including endocrinology and medical genetics. Additionally, testosterone replacement therapy has also been shown to be effective in alleviating certain behavioral and physical (pubertal) concerns. We discussed that the majority of individuals with XXY have reduced fertility or infertility due to azoospermia or oligospermia. Many males with Klinefelter syndrome have gone on to have successful pregnancies, but in most cases these pregnancies have required assisted reproductive therapies.   We reviewed the option of chromosome analysis in the pregnancy via amniocentesis. The couple is not interested in amniocentesis given the late gestation.  Additionally, we discussed the option of postnatal chromosome analysis either by sampling of the cord blood or peripheral blood to confirm or rule out the NIPS result. We reviewed that there are published medical guidelines regarding medical care of individuals with XXY. I directed the couple to AXYS, Association for X and Y chromosome variations, and [name] planned to used this website as a Theatre stage managerresource. I offered follow-up genetic counseling, if they wanted to discuss information further. They declined at this point.   Encouraged [name] to call with additional questions or concerns.   Sabrina Chapman 06/16/2014 1:08 PM

## 2014-06-16 NOTE — Telephone Encounter (Signed)
Left message for patient to return call to discuss results of Panorama test, which are now available.   Sabrina BraunKaren Josphine Laffey 06/16/2014 10:40 AM

## 2014-07-04 ENCOUNTER — Encounter (HOSPITAL_COMMUNITY): Payer: Self-pay

## 2014-07-05 ENCOUNTER — Other Ambulatory Visit (INDEPENDENT_AMBULATORY_CARE_PROVIDER_SITE_OTHER): Payer: BC Managed Care – PPO

## 2014-07-05 DIAGNOSIS — Z23 Encounter for immunization: Secondary | ICD-10-CM

## 2014-07-06 DIAGNOSIS — N979 Female infertility, unspecified: Secondary | ICD-10-CM | POA: Insufficient documentation

## 2014-07-12 ENCOUNTER — Other Ambulatory Visit (HOSPITAL_COMMUNITY): Payer: Self-pay

## 2014-08-30 LAB — OB RESULTS CONSOLE GBS: STREP GROUP B AG: NEGATIVE

## 2014-09-08 ENCOUNTER — Ambulatory Visit (INDEPENDENT_AMBULATORY_CARE_PROVIDER_SITE_OTHER): Payer: BLUE CROSS/BLUE SHIELD | Admitting: Medical

## 2014-09-08 ENCOUNTER — Encounter: Payer: Self-pay | Admitting: Medical

## 2014-09-08 VITALS — BP 128/88 | HR 76 | Temp 98.1°F | Resp 16 | Wt 143.0 lb

## 2014-09-08 DIAGNOSIS — J069 Acute upper respiratory infection, unspecified: Secondary | ICD-10-CM

## 2014-09-08 DIAGNOSIS — Z3490 Encounter for supervision of normal pregnancy, unspecified, unspecified trimester: Secondary | ICD-10-CM

## 2014-09-08 MED ORDER — AMOXICILLIN 875 MG PO TABS: 875.0000 mg | ORAL_TABLET | Freq: Two times a day (BID) | ORAL | Status: DC

## 2014-09-08 NOTE — Progress Notes (Signed)
Subjective:  Sabrina Chapman is a 44 y.o. female who presents for illness.  Started 4 days ago with cough, sinus pressure, nasal congestion, drainage in throat, scratchy throat, bad cough, jaw pain.  Denies fever, nausea, vomiting, diarrhea, shortness of breath or wheezing, no ear pain.  Daughter has been sick recently with a cold and she is improving.  Taking Mucinex and Robitussin-DM, using Neti Pot some .  She is [redacted] weeks pregnant .  No other aggravating or relieving factors.  No other c/o.  ROS as in subjective.   Objective: Filed Vitals:   09/08/14 0917  BP: 128/88  Pulse: 76  Temp: 98.1 F (36.7 C)  Resp: 16   General appearance: Alert, WD/WN, no distress, mildly ill appearing                             Skin: warm, no rash                           Head: maxillary sinus tenderness                            Eyes: conjunctiva normal, corneas clear, PERRLA                            Ears: pearly TMs, external ear canals normal                          Nose: septum midline, turbinates swollen, with erythema and clear discharge             Mouth/throat: MMM, tongue normal, mild pharyngeal erythema                           Neck: supple, no adenopathy, no thyromegaly, nontender                          Heart: RRR, normal S1, S2, no murmurs                         Lungs: CTA bilaterally, no wheezes, rales, or rhonchi     Assessment: Encounter Diagnoses  Name Primary?  . Acute upper respiratory infection Yes  . Pregnancy     Plan: Discussed diagnosis and treatment of URI.  Suggested symptomatic OTC remedies.  Use benadryl, stop Robitussin and Mucinex.   Nasal saline spray for congestion.  Tylenol OTC for fever and malaise.  If worsening over weekend, can begin Amoxicillin, but at this time seems to be viral.  Call/return in 2-3 days if symptoms aren't resolving.

## 2014-09-22 ENCOUNTER — Encounter (HOSPITAL_COMMUNITY): Payer: Self-pay

## 2014-09-22 ENCOUNTER — Encounter (HOSPITAL_COMMUNITY)
Admission: RE | Admit: 2014-09-22 | Discharge: 2014-09-22 | Disposition: A | Discharge status: Home / Self Care | Payer: BLUE CROSS/BLUE SHIELD | Source: Ambulatory Visit | Attending: Obstetrics and Gynecology | Admitting: Obstetrics and Gynecology

## 2014-09-22 ENCOUNTER — Other Ambulatory Visit: Payer: Self-pay | Admitting: Obstetrics and Gynecology

## 2014-09-22 LAB — CBC
HEMATOCRIT: 34.3 % — AB (ref 36.0–46.0)
HEMOGLOBIN: 11.6 g/dL — AB (ref 12.0–15.0)
MCH: 30.8 pg (ref 26.0–34.0)
MCHC: 33.8 g/dL (ref 30.0–36.0)
MCV: 91 fL (ref 78.0–100.0)
PLATELETS: 246 10*3/uL (ref 150–400)
RBC: 3.77 MIL/uL — ABNORMAL LOW (ref 3.87–5.11)
RDW: 14.6 % (ref 11.5–15.5)
WBC: 7.8 10*3/uL (ref 4.0–10.5)

## 2014-09-22 LAB — TYPE AND SCREEN
ABO/RH(D): B POS
Antibody Screen: NEGATIVE

## 2014-09-22 LAB — ABO/RH: ABO/RH(D): B POS

## 2014-09-22 NOTE — Patient Instructions (Addendum)
   Your procedure is scheduled on: JAN 22 AT NOON  Enter through the Main Entrance of [facility] at: 1030AM  Pick up the phone at the desk and dial [phone] and inform us of your arrival.  Please call this number if you have any problems the morning of surgery: [phone]  Remember: Do not eat food after midnight: JAN 21 Do not drink clear liquids after: 8AM Take these medicines the morning of surgery with a SIP OF WATER: TAKE PRILOSEC  DAY OF SURGERY  Do not wear jewelry, make-up, or FINGER nail polish No metal in your hair or on your body. Do not wear lotions, powders, perfumes.  You may wear deodorant.  Do not bring valuables to the hospital. Contacts, dentures or bridgework may not be worn into surgery.  Leave suitcase in the car. After Surgery it may be brought to your room. For patients being admitted to the hospital, checkout time is 11:00am the day of discharge.    Patients discharged on the day of surgery will not be allowed to drive home.

## 2014-09-22 NOTE — H&P (Addendum)
44 y.o.  665w6d    G2P1001 comes in for a repeat cesarean section at term.  Patient has good fetal movement and no bleeding.    Past Medical History  Diagnosis Date  . Allergy     seasonal, plus cats, dust  . IBS (irritable bowel syndrome)   . AMA (advanced maternal age) multigravida 35+   . Anxiety in her early 20's    took Paxil  . GERD (gastroesophageal reflux disease)     Past Surgical History  Procedure Laterality Date  . Sinus surgery      polyp removal  . Dilation and curettage of uterus  2008  . Cesarean section  07/15/2012    Procedure: CESAREAN SECTION;  Surgeon: Loney LaurenceMichelle A Ulysess Witz, MD;  Location: [facility];  Service: Obstetrics;  Laterality: N/A;    OB History  Gravida Para Term Preterm AB SAB TAB Ectopic Multiple Living  2 1 1       1     # Outcome Date GA Lbr Len/2nd Weight Sex Delivery Anes PTL Lv  2 Current           1 Term 07/15/12 5923w5d  2.845 kg (6 lb 4.4 oz) F CS-LTranv Spinal  Y     Comments: legs hyperextended      History   Social History  . Marital Status: Married    Spouse Name: N/A    Number of Children: 1  . Years of Education: N/A   Occupational History  . 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 EXECUTIVE DIRECTOR     Social History Main Topics  . Smoking status: Former Smoker    Quit date: 09/02/1997  . Smokeless tobacco: Never Used  . Alcohol Use: No     Comment: 1-2 drinks every other day when not pregnant  . Drug Use: No  . Sexual Activity: Yes    Birth Control/ Protection: Coitus interruptus   Other Topics Concern  . Not on file   Social History Narrative   Married, 1 daughter [name]   Citric acid and Fructose   Prenatal Course:  1.  Vanishing twin at 10 weeks.  2.  AMA- elected to do CVS testing which originally showed. 2946 XX.  However, on 20 week anatomy scan it was clearly a boy.  Pt seen by MFM who concurred.  Declined f/u amnio.  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  3.  At 35 weeks  had growth scan which showed EFW 32 %ile with BPD 2.3 %ile- microcephaly.  F/u US this week showed 6#1, 17%ile with BPD and HC both <2.3%ile.  On questioning, pt reminded me that she had travelled to [place] and [place] at about 26 weeks.  She denied having any illness during or on return.  Pt's first baby was normal but did only weigh just over 6 pounds but was proportionate.   Prenatal Transfer Tool  Maternal Diabetes: No Genetic Screening: Abnormal:  Results: Other: Klinefelters on NIPT Maternal Ultrasounds/Referrals: Abnormal:  Findings:   Other:klinefelters Fetal Ultrasounds or other Referrals:  None Maternal Substance Abuse:  No Significant Maternal Medications:  None Significant Maternal Lab Results: None   There were no vitals filed for this visit.   Lungs/Cor:  NAD Abdomen:  soft, gravid Ex:  no cords, erythema SVE:  NA FHTs:  present  A/P   For repeat cesarean sectionat term.  All risks, benefits and alternatives discussed with patient and she desires to proceed.  I have  alerted the laboratory and NICU team of the possibility of microcephaly with need for testing for Zica virus.   Pt's mother has ovarian cancer and pt desires permanent sterilization for bilateral sapingectomies; all r/b/ alt d/w her.   Trev Boley A

## 2014-09-23 ENCOUNTER — Inpatient Hospital Stay (HOSPITAL_COMMUNITY)
Admission: AD | Admit: 2014-09-23 | Payer: BLUE CROSS/BLUE SHIELD | Source: Ambulatory Visit | Admitting: Obstetrics and Gynecology

## 2014-09-23 ENCOUNTER — Encounter (HOSPITAL_COMMUNITY): Payer: Self-pay | Admitting: *Deleted

## 2014-09-23 ENCOUNTER — Inpatient Hospital Stay (HOSPITAL_COMMUNITY): Payer: BLUE CROSS/BLUE SHIELD | Admitting: Anesthesiology

## 2014-09-23 ENCOUNTER — Encounter (HOSPITAL_COMMUNITY)
Admission: AD | Disposition: A | Discharge status: Home / Self Care | Payer: Self-pay | Source: Ambulatory Visit | Attending: Obstetrics and Gynecology

## 2014-09-23 ENCOUNTER — Inpatient Hospital Stay (HOSPITAL_COMMUNITY)
Admission: AD | Admit: 2014-09-23 | Discharge: 2014-09-24 | DRG: 766 | Disposition: A | Discharge status: Home / Self Care | Payer: BLUE CROSS/BLUE SHIELD | Source: Ambulatory Visit | Attending: Obstetrics and Gynecology | Admitting: Obstetrics and Gynecology

## 2014-09-23 DIAGNOSIS — Z302 Encounter for sterilization: Secondary | ICD-10-CM

## 2014-09-23 DIAGNOSIS — Z3A39 39 weeks gestation of pregnancy: Secondary | ICD-10-CM | POA: Diagnosis present

## 2014-09-23 DIAGNOSIS — Z9889 Other specified postprocedural states: Secondary | ICD-10-CM

## 2014-09-23 DIAGNOSIS — O09523 Supervision of elderly multigravida, third trimester: Secondary | ICD-10-CM

## 2014-09-23 DIAGNOSIS — Z98891 History of uterine scar from previous surgery: Secondary | ICD-10-CM | POA: Diagnosis present

## 2014-09-23 DIAGNOSIS — O3421 Maternal care for scar from previous cesarean delivery: Principal | ICD-10-CM | POA: Diagnosis present

## 2014-09-23 DIAGNOSIS — Z87891 Personal history of nicotine dependence: Secondary | ICD-10-CM

## 2014-09-23 DIAGNOSIS — O34219 Maternal care for unspecified type scar from previous cesarean delivery: Secondary | ICD-10-CM

## 2014-09-23 DIAGNOSIS — O351XX0 Maternal care for (suspected) chromosomal abnormality in fetus, not applicable or unspecified: Secondary | ICD-10-CM | POA: Diagnosis present

## 2014-09-23 HISTORY — PX: BILATERAL SALPINGECTOMY: SHX5743

## 2014-09-23 LAB — COMPREHENSIVE METABOLIC PANEL
ALT: 16 U/L (ref 0–35)
ANION GAP: 9 (ref 5–15)
AST: 26 U/L (ref 0–37)
Albumin: 3.3 g/dL — ABNORMAL LOW (ref 3.5–5.2)
Alkaline Phosphatase: 204 U/L — ABNORMAL HIGH (ref 39–117)
BILIRUBIN TOTAL: 0.7 mg/dL (ref 0.3–1.2)
BUN: 11 mg/dL (ref 6–23)
CO2: 21 mmol/L (ref 19–32)
CREATININE: 0.51 mg/dL (ref 0.50–1.10)
Calcium: 8.8 mg/dL (ref 8.4–10.5)
Chloride: 105 mEq/L (ref 96–112)
GFR calc Af Amer: >90 mL/min (ref >90)
GFR calc non Af Amer: >90 mL/min (ref >90)
Glucose, Bld: 93 mg/dL (ref 70–99)
POTASSIUM: 3.6 mmol/L (ref 3.5–5.1)
Sodium: 135 mmol/L (ref 135–145)
TOTAL PROTEIN: 6.7 g/dL (ref 6.0–8.3)

## 2014-09-23 LAB — CBC
HCT: 32.7 % — ABNORMAL LOW (ref 36.0–46.0)
Hemoglobin: 11.3 g/dL — ABNORMAL LOW (ref 12.0–15.0)
MCH: 31 pg (ref 26.0–34.0)
MCHC: 34.6 g/dL (ref 30.0–36.0)
MCV: 89.8 fL (ref 78.0–100.0)
PLATELETS: 258 10*3/uL (ref 150–400)
RBC: 3.64 MIL/uL — ABNORMAL LOW (ref 3.87–5.11)
RDW: 14.4 % (ref 11.5–15.5)
WBC: 7.5 10*3/uL (ref 4.0–10.5)

## 2014-09-23 LAB — URIC ACID: Uric Acid, Serum: 5.5 mg/dL (ref 2.4–7.0)

## 2014-09-23 LAB — RPR: RPR Ser Ql: NONREACTIVE

## 2014-09-23 LAB — POCT FERN TEST: POCT Fern Test: POSITIVE

## 2014-09-23 LAB — LACTATE DEHYDROGENASE: LDH: 174 U/L (ref 94–250)

## 2014-09-23 SURGERY — Surgical Case
Anesthesia: Regional

## 2014-09-23 SURGERY — Surgical Case
Anesthesia: Spinal | Site: Abdomen

## 2014-09-23 MED ORDER — KETOROLAC TROMETHAMINE 30 MG/ML IJ SOLN: 30.0000 mg | Freq: Four times a day (QID) | INTRAMUSCULAR | Status: DC | PRN

## 2014-09-23 MED ORDER — SODIUM CHLORIDE 0.9 % IJ SOLN
INTRAMUSCULAR | Status: AC
  Filled 2014-09-23: qty 10

## 2014-09-23 MED ORDER — PRENATAL MULTIVITAMIN CH
1.0000 | ORAL_TABLET | Freq: Every day | ORAL | Status: DC
  Administered 2014-09-23: 1 via ORAL
  Filled 2014-09-23: qty 1

## 2014-09-23 MED ORDER — SCOPOLAMINE 1 MG/3DAYS TD PT72
1.0000 | MEDICATED_PATCH | Freq: Once | TRANSDERMAL | Status: DC
  Filled 2014-09-23: qty 1

## 2014-09-23 MED ORDER — DEXAMETHASONE SODIUM PHOSPHATE 4 MG/ML IJ SOLN
INTRAMUSCULAR | Status: DC | PRN
  Administered 2014-09-23: 4 mg via INTRAVENOUS

## 2014-09-23 MED ORDER — BUPIVACAINE LIPOSOME 1.3 % IJ SUSP
INTRAMUSCULAR | Status: DC | PRN
  Administered 2014-09-23: 20 mL

## 2014-09-23 MED ORDER — DIPHENHYDRAMINE HCL 25 MG PO CAPS: 25.0000 mg | ORAL_CAPSULE | ORAL | Status: DC | PRN

## 2014-09-23 MED ORDER — SIMETHICONE 80 MG PO CHEW
80.0000 mg | CHEWABLE_TABLET | Freq: Three times a day (TID) | ORAL | Status: DC
  Administered 2014-09-23 – 2014-09-24 (×3): 80 mg via ORAL
  Filled 2014-09-23 (×2): qty 1

## 2014-09-23 MED ORDER — BUPIVACAINE LIPOSOME 1.3 % IJ SUSP
20.0000 mL | Freq: Once | INTRAMUSCULAR | Status: DC
  Filled 2014-09-23: qty 20

## 2014-09-23 MED ORDER — IBUPROFEN 600 MG PO TABS
600.0000 mg | ORAL_TABLET | Freq: Four times a day (QID) | ORAL | Status: DC
  Administered 2014-09-23 – 2014-09-24 (×3): 600 mg via ORAL
  Filled 2014-09-23 (×3): qty 1

## 2014-09-23 MED ORDER — MORPHINE SULFATE (PF) 0.5 MG/ML IJ SOLN
INTRAMUSCULAR | Status: DC | PRN
  Administered 2014-09-23: .1 mg via INTRATHECAL

## 2014-09-23 MED ORDER — OXYCODONE-ACETAMINOPHEN 5-325 MG PO TABS
1.0000 | ORAL_TABLET | ORAL | Status: DC | PRN
  Administered 2014-09-24: 1 via ORAL
  Filled 2014-09-23: qty 1

## 2014-09-23 MED ORDER — ONDANSETRON HCL 4 MG/2ML IJ SOLN: 4.0000 mg | INTRAMUSCULAR | Status: DC | PRN

## 2014-09-23 MED ORDER — ONDANSETRON HCL 4 MG/2ML IJ SOLN
INTRAMUSCULAR | Status: AC
  Filled 2014-09-23: qty 2

## 2014-09-23 MED ORDER — HYDROMORPHONE HCL 1 MG/ML IJ SOLN
INTRAMUSCULAR | Status: AC
  Administered 2014-09-23: 0.5 mg via INTRAVENOUS
  Filled 2014-09-23: qty 1

## 2014-09-23 MED ORDER — MORPHINE SULFATE 0.5 MG/ML IJ SOLN
INTRAMUSCULAR | Status: AC
  Filled 2014-09-23: qty 10

## 2014-09-23 MED ORDER — TETANUS-DIPHTH-ACELL PERTUSSIS 5-2.5-18.5 LF-MCG/0.5 IM SUSP: 0.5000 mL | Freq: Once | INTRAMUSCULAR | Status: DC

## 2014-09-23 MED ORDER — CEFAZOLIN SODIUM-DEXTROSE 2-3 GM-% IV SOLR
2.0000 g | INTRAVENOUS | Status: AC
  Administered 2014-09-23: 2 g via INTRAVENOUS
  Filled 2014-09-23: qty 50

## 2014-09-23 MED ORDER — DIBUCAINE 1 % RE OINT: 1.0000 "application " | TOPICAL_OINTMENT | RECTAL | Status: DC | PRN

## 2014-09-23 MED ORDER — MEPERIDINE HCL 25 MG/ML IJ SOLN: 6.2500 mg | INTRAMUSCULAR | Status: DC | PRN

## 2014-09-23 MED ORDER — LACTATED RINGERS IV SOLN: 500.0000 mL | INTRAVENOUS | Status: DC | PRN

## 2014-09-23 MED ORDER — METHYLERGONOVINE MALEATE 0.2 MG PO TABS: 0.2000 mg | ORAL_TABLET | ORAL | Status: DC | PRN

## 2014-09-23 MED ORDER — WITCH HAZEL-GLYCERIN EX PADS: 1.0000 "application " | MEDICATED_PAD | CUTANEOUS | Status: DC | PRN

## 2014-09-23 MED ORDER — OXYCODONE-ACETAMINOPHEN 5-325 MG PO TABS: 2.0000 | ORAL_TABLET | ORAL | Status: DC | PRN

## 2014-09-23 MED ORDER — LACTATED RINGERS IV SOLN
INTRAVENOUS | Status: DC
  Administered 2014-09-23 (×3): via INTRAVENOUS

## 2014-09-23 MED ORDER — MEASLES, MUMPS & RUBELLA VAC ~~LOC~~ INJ
0.5000 mL | INJECTION | Freq: Once | SUBCUTANEOUS | Status: DC
  Filled 2014-09-23: qty 0.5

## 2014-09-23 MED ORDER — FLEET ENEMA 7-19 GM/118ML RE ENEM: 1.0000 | ENEMA | Freq: Every day | RECTAL | Status: DC | PRN

## 2014-09-23 MED ORDER — SIMETHICONE 80 MG PO CHEW: 80.0000 mg | CHEWABLE_TABLET | ORAL | Status: DC | PRN

## 2014-09-23 MED ORDER — KETOROLAC TROMETHAMINE 30 MG/ML IJ SOLN
30.0000 mg | Freq: Once | INTRAMUSCULAR | Status: DC
  Administered 2014-09-23: 30 mg via INTRAVENOUS

## 2014-09-23 MED ORDER — ONDANSETRON HCL 4 MG/2ML IJ SOLN: 4.0000 mg | Freq: Three times a day (TID) | INTRAMUSCULAR | Status: DC | PRN

## 2014-09-23 MED ORDER — SCOPOLAMINE 1 MG/3DAYS TD PT72
MEDICATED_PATCH | TRANSDERMAL | Status: DC | PRN
  Administered 2014-09-23: 1 via TRANSDERMAL

## 2014-09-23 MED ORDER — SODIUM CHLORIDE 0.9 % IJ SOLN: 3.0000 mL | INTRAMUSCULAR | Status: DC | PRN

## 2014-09-23 MED ORDER — NALBUPHINE HCL 10 MG/ML IJ SOLN: 5.0000 mg | INTRAMUSCULAR | Status: DC | PRN

## 2014-09-23 MED ORDER — NALOXONE HCL 0.4 MG/ML IJ SOLN: 0.4000 mg | INTRAMUSCULAR | Status: DC | PRN

## 2014-09-23 MED ORDER — ZOLPIDEM TARTRATE 5 MG PO TABS: 5.0000 mg | ORAL_TABLET | Freq: Every evening | ORAL | Status: DC | PRN

## 2014-09-23 MED ORDER — LACTATED RINGERS IV SOLN
INTRAVENOUS | Status: DC
  Administered 2014-09-23: 19:00:00 via INTRAVENOUS

## 2014-09-23 MED ORDER — FERROUS SULFATE 325 (65 FE) MG PO TABS
325.0000 mg | ORAL_TABLET | Freq: Two times a day (BID) | ORAL | Status: DC
  Administered 2014-09-23 – 2014-09-24 (×2): 325 mg via ORAL
  Filled 2014-09-23: qty 1

## 2014-09-23 MED ORDER — OXYTOCIN 40 UNITS IN LACTATED RINGERS INFUSION - SIMPLE MED: 62.5000 mL/h | INTRAVENOUS | Status: AC

## 2014-09-23 MED ORDER — PHENYLEPHRINE 8 MG IN D5W 100 ML (0.08MG/ML) PREMIX OPTIME
INJECTION | INTRAVENOUS | Status: AC
  Filled 2014-09-23: qty 100

## 2014-09-23 MED ORDER — OXYTOCIN 10 UNIT/ML IJ SOLN
INTRAMUSCULAR | Status: AC
  Filled 2014-09-23: qty 4

## 2014-09-23 MED ORDER — LANOLIN HYDROUS EX OINT: 1.0000 "application " | TOPICAL_OINTMENT | CUTANEOUS | Status: DC | PRN

## 2014-09-23 MED ORDER — NALOXONE HCL 1 MG/ML IJ SOLN
1.0000 ug/kg/h | INTRAVENOUS | Status: DC | PRN
  Filled 2014-09-23: qty 2

## 2014-09-23 MED ORDER — ONDANSETRON HCL 4 MG PO TABS: 4.0000 mg | ORAL_TABLET | ORAL | Status: DC | PRN

## 2014-09-23 MED ORDER — METHYLERGONOVINE MALEATE 0.2 MG/ML IJ SOLN: 0.2000 mg | INTRAMUSCULAR | Status: DC | PRN

## 2014-09-23 MED ORDER — ONDANSETRON HCL 4 MG/2ML IJ SOLN: 4.0000 mg | Freq: Four times a day (QID) | INTRAMUSCULAR | Status: DC | PRN

## 2014-09-23 MED ORDER — DIPHENHYDRAMINE HCL 50 MG/ML IJ SOLN: 12.5000 mg | INTRAMUSCULAR | Status: DC | PRN

## 2014-09-23 MED ORDER — DIPHENHYDRAMINE HCL 25 MG PO CAPS: 25.0000 mg | ORAL_CAPSULE | Freq: Four times a day (QID) | ORAL | Status: DC | PRN

## 2014-09-23 MED ORDER — SIMETHICONE 80 MG PO CHEW
80.0000 mg | CHEWABLE_TABLET | ORAL | Status: DC
  Administered 2014-09-24: 80 mg via ORAL
  Filled 2014-09-23: qty 1

## 2014-09-23 MED ORDER — NALBUPHINE HCL 10 MG/ML IJ SOLN: 5.0000 mg | Freq: Once | INTRAMUSCULAR | Status: AC | PRN

## 2014-09-23 MED ORDER — SCOPOLAMINE 1 MG/3DAYS TD PT72
MEDICATED_PATCH | TRANSDERMAL | Status: AC
  Filled 2014-09-23: qty 1

## 2014-09-23 MED ORDER — HYDROMORPHONE HCL 1 MG/ML IJ SOLN
0.2500 mg | INTRAMUSCULAR | Status: DC | PRN
Start: 2014-09-23 — End: 2014-09-23
  Administered 2014-09-23 (×2): 0.5 mg via INTRAVENOUS

## 2014-09-23 MED ORDER — KETOROLAC TROMETHAMINE 30 MG/ML IJ SOLN
INTRAMUSCULAR | Status: AC
  Filled 2014-09-23: qty 1

## 2014-09-23 MED ORDER — OXYTOCIN 10 UNIT/ML IJ SOLN
40.0000 [IU] | INTRAVENOUS | Status: DC | PRN
  Administered 2014-09-23: 40 [IU] via INTRAVENOUS

## 2014-09-23 MED ORDER — FENTANYL CITRATE 0.05 MG/ML IJ SOLN
INTRAMUSCULAR | Status: DC | PRN
  Administered 2014-09-23: 25 ug via INTRATHECAL

## 2014-09-23 MED ORDER — FENTANYL CITRATE 0.05 MG/ML IJ SOLN
INTRAMUSCULAR | Status: AC
  Filled 2014-09-23: qty 2

## 2014-09-23 MED ORDER — MENTHOL 3 MG MT LOZG: 1.0000 | LOZENGE | OROMUCOSAL | Status: DC | PRN

## 2014-09-23 MED ORDER — ONDANSETRON HCL 4 MG/2ML IJ SOLN
INTRAMUSCULAR | Status: DC | PRN
  Administered 2014-09-23: 4 mg via INTRAVENOUS

## 2014-09-23 MED ORDER — SENNOSIDES-DOCUSATE SODIUM 8.6-50 MG PO TABS
2.0000 | ORAL_TABLET | ORAL | Status: DC
  Administered 2014-09-24: 2 via ORAL
  Filled 2014-09-23: qty 2

## 2014-09-23 MED ORDER — BISACODYL 10 MG RE SUPP: 10.0000 mg | Freq: Every day | RECTAL | Status: DC | PRN

## 2014-09-23 MED ORDER — PHENYLEPHRINE 8 MG IN D5W 100 ML (0.08MG/ML) PREMIX OPTIME
INJECTION | INTRAVENOUS | Status: DC | PRN
  Administered 2014-09-23: 50 ug/min via INTRAVENOUS
  Administered 2014-09-23: 30 ug/min via INTRAVENOUS

## 2014-09-23 SURGICAL SUPPLY — 32 items
BENZOIN TINCTURE PRP APPL 2/3 (GAUZE/BANDAGES/DRESSINGS) ×3 IMPLANT
CLAMP CORD UMBIL (MISCELLANEOUS) IMPLANT
CLOSURE STERI STRIP 1/2 X4 (GAUZE/BANDAGES/DRESSINGS) ×2 IMPLANT
CLOSURE WOUND 1/2 X4 (GAUZE/BANDAGES/DRESSINGS) ×1
CLOTH BEACON ORANGE TIMEOUT ST (SAFETY) ×3 IMPLANT
DRAPE SHEET LG 3/4 BI-LAMINATE (DRAPES) IMPLANT
DRSG OPSITE POSTOP 4X10 (GAUZE/BANDAGES/DRESSINGS) ×3 IMPLANT
DURAPREP 26ML APPLICATOR (WOUND CARE) ×3 IMPLANT
ELECT REM PT RETURN 9FT ADLT (ELECTROSURGICAL) ×3
ELECTRODE REM PT RTRN 9FT ADLT (ELECTROSURGICAL) ×1 IMPLANT
EXTRACTOR VACUUM BELL STYLE (SUCTIONS) IMPLANT
GLOVE BIO SURGEON STRL SZ7 (GLOVE) ×3 IMPLANT
GOWN STRL REUS W/TWL LRG LVL3 (GOWN DISPOSABLE) ×6 IMPLANT
KIT ABG SYR 3ML LUER SLIP (SYRINGE) IMPLANT
NEEDLE HYPO 25X5/8 SAFETYGLIDE (NEEDLE) IMPLANT
NS IRRIG 1000ML POUR BTL (IV SOLUTION) ×6 IMPLANT
PACK C SECTION WH (CUSTOM PROCEDURE TRAY) ×3 IMPLANT
PAD OB MATERNITY 4.3X12.25 (PERSONAL CARE ITEMS) ×3 IMPLANT
RTRCTR C-SECT PINK 25CM LRG (MISCELLANEOUS) ×3 IMPLANT
STAPLER VISISTAT 35W (STAPLE) IMPLANT
STRIP CLOSURE SKIN 1/2X4 (GAUZE/BANDAGES/DRESSINGS) ×2 IMPLANT
SUT MNCRL 0 VIOLET CTX 36 (SUTURE) ×2 IMPLANT
SUT MONOCRYL 0 CTX 36 (SUTURE) ×4
SUT PDS AB 0 CTX 60 (SUTURE) IMPLANT
SUT PLAIN 2 0 XLH (SUTURE) IMPLANT
SUT VIC AB 0 CT1 27 (SUTURE) ×4
SUT VIC AB 0 CT1 27XBRD ANBCTR (SUTURE) ×2 IMPLANT
SUT VIC AB 2-0 CT1 27 (SUTURE) ×2
SUT VIC AB 2-0 CT1 TAPERPNT 27 (SUTURE) ×1 IMPLANT
SUT VIC AB 4-0 KS 27 (SUTURE) ×3 IMPLANT
TOWEL OR 17X24 6PK STRL BLUE (TOWEL DISPOSABLE) ×3 IMPLANT
TRAY FOLEY CATH 14FR (SET/KITS/TRAYS/PACK) ×3 IMPLANT

## 2014-09-23 NOTE — MAU Note (Signed)
Pt reports leaking fluid since 0045, having contractions.

## 2014-09-23 NOTE — Progress Notes (Signed)
There has been no change in the patients history, status since the history and physical.  Pt ruptured at 00:30 this am- clear and FHTs NST R.  Pt for c/s now.  Filed Vitals:   09/23/14 0527 09/23/14 0557 09/23/14 0627 09/23/14 0709  BP: 149/95 156/92 156/92 150/96  Pulse: 80 72 74 86  Temp:  97.7 F (36.5 C)  97.8 F (36.6 C)  TempSrc:  Oral  Oral  Resp:  20  18  Height:      Weight:      SpO2:        Lab Results  Component Value Date   WBC 7.5 09/23/2014   HGB 11.3* 09/23/2014   HCT 32.7* 09/23/2014   MCV 89.8 09/23/2014   PLT 258 09/23/2014    Balinda Heacock A

## 2014-09-23 NOTE — Anesthesia Preprocedure Evaluation (Signed)
Anesthesia Evaluation    Reviewed: Allergy & Precautions, NPO status , Patient's Chart, lab work & pertinent test results  Airway Mallampati: II  TM Distance: >3 FB Neck ROM: Full    Dental  (+) Teeth Intact   Pulmonary neg shortness of breath, neg sleep apnea, neg COPDneg recent URI, former smoker,  breath sounds clear to auscultation        Cardiovascular negative cardio ROS  Rhythm:Regular     Neuro/Psych PSYCHIATRIC DISORDERS Anxiety negative neurological ROS     GI/Hepatic Neg liver ROS, GERD-  Medicated and Controlled,  Endo/Other  negative endocrine ROS  Renal/GU      Musculoskeletal   Abdominal   Peds  Hematology  (+) anemia ,   Anesthesia Other Findings   Reproductive/Obstetrics (+) Pregnancy                             Anesthesia Physical Anesthesia Plan  ASA: II  Anesthesia Plan: Spinal   Post-op Pain Management:    Induction:   Airway Management Planned: Natural Airway  Additional Equipment: None  Intra-op Plan:   Post-operative Plan:   Informed Consent: I have reviewed the patients History and Physical, chart, labs and discussed the procedure including the risks, benefits and alternatives for the proposed anesthesia with the patient or authorized representative who has indicated his/her understanding and acceptance.   Dental advisory given  Plan Discussed with: CRNA and Surgeon  Anesthesia Plan Comments:         Anesthesia Quick Evaluation

## 2014-09-23 NOTE — Consult Note (Signed)
Neonatology Note:   Attendance at C-section:    I was asked by Dr. Henderson CloudHorvath to attend this repeat C/S at term after SROM. The mother is a G2P1 B pos, GBS neg with recent travel to [place]. NIPT 5946 XXY infant (chorionic villous sampling showed 3546 XX, but ultrasound shows female anatomy); vanishing twin; recent slowed FOC growth, concern for Zica virus ROM 7 hours prior to delivery, fluid clear. Infant vigorous with good spontaneous cry and tone. Needed no suctioning. Ap 9/9. Lungs clear to ausc in DR. To CN to care of Pediatrician. FOC 32.5    Sabrina Souhristie C. Ophelia Sipe, MD

## 2014-09-23 NOTE — Addendum Note (Signed)
Addendum  created 09/23/14 1403 by Renford DillsJanet L Shalev Helminiak, CRNA   Modules edited: Notes Section   Notes Section:  File: [id number]

## 2014-09-23 NOTE — Op Note (Signed)
09/23/2014  8:43 AM  PATIENT:  Mont Dutton [place]Owens  44 y.o. female  PRE-OPERATIVE DIAGNOSIS:  CESAREAN SECTION with bilateral salpingectomies  POST-OPERATIVE DIAGNOSIS:  CESAREAN SECTION with bilateral salpingectomies  PROCEDURE:  Procedure(s): CESAREAN SECTION (N/A)  SURGEON:  Surgeon(s) and Role:    * Loney Laurence, MD - Primary  ANESTHESIA:   spinal  EBL:  Total I/O In: 2000 [I.V.:2000] Out: 1125 [Urine:525; Blood:600]  LOCAL MEDICATIONS USED:  OTHER exparel injected into subcutaneous tissue  SPECIMEN:  Source of Specimen:  placenta, serum for Zica rna to possibly be sent to [facility]  DISPOSITION OF SPECIMEN:  PATHOLOGY  COUNTS:  YES  TOURNIQUET:  * No tourniquets in log *  DICTATION: .Note written in EPIC  PLAN OF CARE: Admit to inpatient   PATIENT DISPOSITION:  PACU - hemodynamically stable.   Delay start of Pharmacological VTE agent (>24hrs) due to surgical blood loss or risk of bleeding: not applicable  Complications:  none Medications:  Ancef, Pitocin Findings:  Baby female with normal genitalia, Apgars 9,9, weight 5#13.   Normal tubes, ovaries and uterus seen.  Baby was skin to skin with mother after birth in the OR.  Neo did measurements- baby's head and body are both measuring 25%ile- no clinical evidence of microcephaly.  Technique:  After adequate spinal anesthesia was achieved, the patient was prepped and draped in usual sterile fashion.  A foley catheter was used to drain the bladder.  A pfannanstiel incision was made with the scalpel and carried down to the fascia with the bovie cautery. The fascia was incised in the midline with the scalpel and carried in a transverse curvilinear manner bilaterally.  The fascia was reflected superiorly and inferiorly off the rectus muscles and the muscles split in the midline.  A bowel free portion of the peritoneum was entered bluntly and then extended in a superior and inferior manner with good  visualization of the bowel and bladder.  The Alexis instrument was then placed and the vesico-uterine fascia tented up and incised in a transverse curvilinear manner.  A 2 cm transverse incision was made in the upper portion of the lower uterine segment until the amnion was exposed.   The incision was extended transversely in a blunt manner.  Clear fluid was noted and the baby delivered in the vertex presentation without complication.  The baby was bulb suctioned and the cord was clamped and cut.  The baby was then handed to awaiting Neonatology.  The placenta was then delivered manually and the uterus cleared of all debris.  The uterine incision was then closed with a running lock stitch of 0 monocryl.  An imbricating layer of 0 monocryl was closed as well. Excellent hemostasis of the uterine incision was achieved and the abdomen was cleared with irrigation.    The L tube was identified and picked up with babcocks.  A small incision was made in the mesosalpinx close to the cornua and two free ties were tied close to the cornua.  A metz was uses to excise the tube and the mesosalpinx was clamped with a kelly for it's whole length.  The Jen Mow was cauterized with the bovie and a free tie passed around the kelly and tied down. The tube was then removed with the bovie and sent to path.  The same was done for the R tube.  Both were sent to pathology.  The peritoneum was closed with a running stitch of 2-0 vicryl.  This incorporated the rectus  muscles as a separate layer.  The fascia was then closed with a running stitch of 0 vicryl.  The subcutaneous layer was closed with interrupted  stitches of 2-0 plain gut.  The skin was closed with 4-0 vicryl on a Keith needle and steri-strips after revising the old scar.  The patient tolerated the procedure well and was returned to the recovery room in stable condition.  All counts were correct times three.  Andreea Arca A

## 2014-09-23 NOTE — Lactation Note (Signed)
This note was copied from the chart of Sabrina Chapman. Lactation Consultation Note  Initial visit done.  Breastfeeding consultation services and support information given and reviewed with patient.  Baby is 7 hours old and starting to show feeding cues.  Assisted mom with cross cradle hold on left breast.  Demonstrated techniques for supporting breast and using good compression to allow for easier and deeper latch.  Baby latched off and on for 10-15 minutes.  Instructed mom on suck training and recommended doing this some prior to latch.  Mom is very sleepy and education will need reviewed.  Encouraged to call with concerns/latch assist prn.  Patient Name: Sabrina Chapman ZOXWR'UToday's Date: 09/23/2014 Reason for consult: Initial assessment;Infant < 6lbs   Maternal Data Has patient been taught Hand Expression?: Yes Does the patient have breastfeeding experience prior to this delivery?: Yes  Feeding Feeding Type: Breast Fed Length of feed: 0 min (sleeping)  LATCH Score/Interventions Latch: Repeated attempts needed to sustain latch, nipple held in mouth throughout feeding, stimulation needed to elicit sucking reflex. Intervention(s): Adjust position;Assist with latch;Breast massage;Breast compression  Audible Swallowing: None Intervention(s): Skin to skin;Hand expression  Type of Nipple: Everted at rest and after stimulation  Comfort (Breast/Nipple): Soft / non-tender     Hold (Positioning): Assistance needed to correctly position infant at breast and maintain latch. Intervention(s): Breastfeeding basics reviewed;Support Pillows;Position options;Skin to skin  LATCH Score: 6  Lactation Tools Discussed/Used     Consult Status Consult Status: Follow-up Date: 09/24/14 Follow-up type: In-patient    Huston FoleyMOULDEN, Diamantina Edinger S 09/23/2014, 3:14 PM

## 2014-09-23 NOTE — H&P (Addendum)
Sabrina Chapman is a 43 y.o. female G2P1 a 39 weeks presenting for rupture of membranes 12:30 am.  She is not having contractions, no vaginal bleeding, normal fetal movements.   Maternal Medical History:  Reason for admission: Rupture of membranes.  Nausea.  Contractions: Onset was 1-2 hours ago.   Frequency: rare.   Perceived severity is mild.    Fetal activity: Perceived fetal activity is normal.   Last perceived fetal movement was within the past hour.    Prenatal complications: PIH and IUGR.   Prenatal Complications - Diabetes: none.    OB History    Gravida Para Term Preterm AB TAB SAB Ectopic Multiple Living   Past Medical History  Diagnosis Date  . Allergy     seasonal, plus cats, dust  . IBS (irritable bowel syndrome)   . AMA (advanced maternal age) multigravida 35+   . Anxiety in her early 20's    took Paxil  . GERD (gastroesophageal reflux disease)    Past Surgical History  Procedure Laterality Date  . Sinus surgery      polyp removal  . Dilation and curettage of uterus  2008  . Cesarean section  07/15/2012    Procedure: CESAREAN SECTION;  Surgeon: Loney Laurence, MD;  Location: [facility];  Service: Obstetrics;  Laterality: N/A;   Family History: family history includes Allergies in her father; Breast cancer (age of onset: 90) in her mother; Cancer in her mother; Cancer (age of onset: 84) in her maternal grandmother; Depression in her father and paternal grandmother; Heart disease in her maternal grandfather and mother; Hyperlipidemia in her father and mother; Ovarian cancer (age of onset: 52) in her mother. There is no history of Diabetes. Social History:  reports that she quit smoking about 17 years ago. She has never used smokeless tobacco. She reports that she does not drink alcohol or use illicit drugs.   Prenatal Transfer Tool  Maternal Diabetes: No Genetic Screening: Abnormal:  Results: Other: NIPT 83 XXY Maternal  Ultrasounds/Referrals: Abnormal:  Findings:   IUGR (microcephaly HC/ BPD last scan <2% Fetal Ultrasounds or other Referrals:  Referred to Materal Fetal Medicine  Maternal Substance Abuse:  No Significant Maternal Medications:  None Significant Maternal Lab Results:  Lab values include: Group B Strep negative Other Comments:  CVS done early 14 XX however anatomy scan shows female fetus  Review of Systems  Constitutional: Negative for fever and chills.  Eyes: Negative for blurred vision and double vision.  Respiratory: Negative for cough.   Cardiovascular: Negative for chest pain and palpitations.  Gastrointestinal: Negative for heartburn and nausea.  Genitourinary: Negative for dysuria.  Musculoskeletal: Negative for myalgias.  Skin: Negative for rash.  Neurological: Negative for dizziness and headaches.  Endo/Heme/Allergies: Negative for environmental allergies. Does not bruise/bleed easily.  Psychiatric/Behavioral: Negative for depression.  All other systems reviewed and are negative.   Dilation: 1 Effacement (%): 70 Station: -1 Exam by:: Restaurant manager, fast food  Blood pressure 150/98, pulse 96, temperature 98.4 F (36.9 C), temperature source Oral, resp. rate 18, height  (1.676 m), weight 65.772 kg (145 lb), last menstrual period 12/24/2013, SpO2 100 %. Maternal Exam:  Uterine Assessment: Contraction strength is mild.  Abdomen: Patient reports no abdominal tenderness. Surgical scars: low transverse.   Fundal height is 38 cm.   Estimated fetal weight is 2700 grams.   Fetal presentation: vertex  Introitus: Normal vulva. Normal vagina.  Ferning test: positive.  Amniotic fluid character: clear.  Pelvis: adequate for delivery.   Cervix: Cervix evaluated by sterile speculum exam and digital exam.   1/70/-3  Fetal Exam Fetal Monitor Review: Mode: hand-held doppler probe.   Baseline rate: 135.  Variability: moderate (6-25 bpm).   Pattern: accelerations present and no decelerations.     Fetal State Assessment: Category I - tracings are normal.     Physical Exam  Vitals reviewed. Constitutional: She is oriented to person, place, and time. She appears well-developed and well-nourished.  HENT:  Head: Normocephalic.  Eyes: Pupils are equal, round, and reactive to light.  Cardiovascular: Normal rate, regular rhythm and normal heart sounds.   Respiratory: Effort normal and breath sounds normal.  GI: Soft.  Neurological: She is alert and oriented to person, place, and time.  Skin: Skin is warm.    Prenatal labs: ABO, Rh: --/--/B POS, B POS (01/21 0955) Antibody: NEG (01/21 0955) Rubella:  IMMUNE RPR:   NR HBsAg:   NEG HIV:   NEG GBS:  NEG   Assessment/Plan: 44 yo G2P1 at 39 weeks s/p PROM, history of prior cesarean delivery for breech scheduled for rpt CD today at noon.   Her pregnancy is complicated by AMA, CVS done early showed 46XX however, anatomy scan showed female infant.    NIPT done and the result demonstrated Klinefelters syndrome 46XXY. Patient followed solely by Dr. Henderson CloudHorvath and MFM.  Recent growth scan shows microcephaly (HC/BPD <2.3%)  Patient would like to wait until the morning so Dr. Henderson CloudHorvath can do her surgery, we discussed the risk of of her going into  Labor and / chorioamnionitis and then having to do an urgent/ emergent cesarean section. She voiced understanding.  Will admit to labor and delivery Continuous monitoring IV hydration NPO On call to OR for repeat cesarean delivery, patient having a bilateral salpingectomy at time of surgery.  Peds to be made aware of possible fetal anomaly. Blood pressures elevated on admission, Patient w/o signs or sx of preeclampsia but will get PEC labs.    Sabrina Chapman, Sabrina Chapman 09/23/2014, 3:22 AM

## 2014-09-23 NOTE — MAU Note (Signed)
Pt states that her water broke at 1230 this morning. Denies vaginal bleeding. Periodic, irregular contractions that feel like menstrual cramps. Positive fetal movement.

## 2014-09-23 NOTE — Anesthesia Procedure Notes (Signed)

## 2014-09-23 NOTE — Anesthesia Postprocedure Evaluation (Signed)
  Anesthesia Post Note  Patient: Sabrina Chapman  Procedure(s) Performed: Procedure(s) (LRB): CESAREAN SECTION (N/A)  Anesthesia type: Spinal  Patient location: PACU  Post pain: Pain level controlled  Post assessment: Post-op Vital signs reviewed  Last Vitals:  Filed Vitals:   09/23/14 0915  BP: 125/82  Pulse: 83  Temp:   Resp: 20    Post vital signs: Reviewed  Level of consciousness: awake  Complications: No apparent anesthesia complications

## 2014-09-23 NOTE — Transfer of Care (Signed)
Immediate Anesthesia Transfer of Care Note  Patient: Sabrina Chapman  Procedure(s) Performed: Procedure(s): CESAREAN SECTION (N/A)  Patient Location: PACU  Anesthesia Type:Spinal  Level of Consciousness: awake  Airway & Oxygen Therapy: Patient Spontanous Breathing  Post-op Assessment: Report given to PACU RN and Post -op Vital signs reviewed and stable  Post vital signs: stable  Complications: No apparent anesthesia complications

## 2014-09-23 NOTE — Anesthesia Postprocedure Evaluation (Signed)
  Anesthesia Post-op Note  Patient: Sabrina Chapman  Procedure(s) Performed: Procedure(s): CESAREAN SECTION (N/A)  Patient Location: Mother/Baby  Anesthesia Type:Spinal  Level of Consciousness: awake  Airway and Oxygen Therapy: Patient Spontanous Breathing  Post-op Pain: mild  Post-op Assessment: Patient's Cardiovascular Status Stable and Respiratory Function Stable  Post-op Vital Signs: stable  Last Vitals:  Filed Vitals:   09/23/14 1215  BP: 145/90  Pulse: 68  Temp: 37.1 C  Resp: 20    Complications: No apparent anesthesia complications

## 2014-09-23 NOTE — Brief Op Note (Signed)
09/23/2014  8:43 AM  PATIENT:  Sabrina Chapman  44 y.o. female  PRE-OPERATIVE DIAGNOSIS:  CESAREAN SECTION with bilateral salpingectomies  POST-OPERATIVE DIAGNOSIS:  CESAREAN SECTION with bilateral salpingectomies  PROCEDURE:  Procedure(s): CESAREAN SECTION (N/A)  SURGEON:  Surgeon(s) and Role:    * Shakiah Wester A Rushil Kimbrell, MD - Primary  ANESTHESIA:   spinal  EBL:  Total I/O In: 2000 [I.V.:2000] Out: 1125 [Urine:525; Blood:600]  LOCAL MEDICATIONS USED:  OTHER exparel injected into subcutaneous tissue  SPECIMEN:  Source of Specimen:  placenta, serum for Zica rna to possibly be [facility]  DISPOSITION OF SPECIMEN:  PATHOLOGY  COUNTS:  YES  TOURNIQUET:  * No tourniquets in log *  DICTATION: .Note written in EPIC  PLAN OF CARE: Admit to inpatient   PATIENT DISPOSITION:  PACU - hemodynamically stable.   Delay start of Pharmacological VTE agent (>24hrs) due to surgical blood loss or risk of bleeding: not applicable  Complications:  none Medications:  Ancef, Pitocin Findings:  Baby female with normal genitalia, Apgars 9,9, weight 5#13.   Normal tubes, ovaries and uterus seen.  Baby was skin to skin with mother after birth in the OR.  Neo did measurements- baby's head and body are both measuring 25%ile- no clinical evidence of microcephaly.  Technique:  After adequate spinal anesthesia was achieved, the patient was prepped and draped in usual sterile fashion.  A foley catheter was used to drain the bladder.  A pfannanstiel incision was made with the scalpel and carried down to the fascia with the bovie cautery. The fascia was incised in the midline with the scalpel and carried in a transverse curvilinear manner bilaterally.  The fascia was reflected superiorly and inferiorly off the rectus muscles and the muscles split in the midline.  A bowel free portion of the peritoneum was entered bluntly and then extended in a superior and inferior manner with good  visualization of the bowel and bladder.  The Alexis instrument was then placed and the vesico-uterine fascia tented up and incised in a transverse curvilinear manner.  A 2 cm transverse incision was made in the upper portion of the lower uterine segment until the amnion was exposed.   The incision was extended transversely in a blunt manner.  Clear fluid was noted and the baby delivered in the vertex presentation without complication.  The baby was bulb suctioned and the cord was clamped and cut.  The baby was then handed to awaiting Neonatology.  The placenta was then delivered manually and the uterus cleared of all debris.  The uterine incision was then closed with a running lock stitch of 0 monocryl.  An imbricating layer of 0 monocryl was closed as well. Excellent hemostasis of the uterine incision was achieved and the abdomen was cleared with irrigation.    The L tube was identified and picked up with babcocks.  A small incision was made in the mesosalpinx close to the cornua and two free ties were tied close to the cornua.  A metz was uses to excise the tube and the mesosalpinx was clamped with a kelly for it's whole length.  The metz was cauterized with the bovie and a free tie passed around the kelly and tied down. The tube was then removed with the bovie and sent to path.  The same was done for the R tube.  Both were sent to pathology.  The peritoneum was closed with a running stitch of 2-0 vicryl.  This incorporated the rectus   muscles as a separate layer.  The fascia was then closed with a running stitch of 0 vicryl.  The subcutaneous layer was closed with interrupted  stitches of 2-0 plain gut.  The skin was closed with 4-0 vicryl on a Keith needle and steri-strips after revising the old scar.  The patient tolerated the procedure well and was returned to the recovery room in stable condition.  All counts were correct times three.  Tabrina Esty A    

## 2014-09-24 LAB — CBC
HCT: 27.9 % — ABNORMAL LOW (ref 36.0–46.0)
Hemoglobin: 9.3 g/dL — ABNORMAL LOW (ref 12.0–15.0)
MCH: 30.8 pg (ref 26.0–34.0)
MCHC: 33.3 g/dL (ref 30.0–36.0)
MCV: 92.4 fL (ref 78.0–100.0)
Platelets: 207 10*3/uL (ref 150–400)
RBC: 3.02 MIL/uL — AB (ref 3.87–5.11)
RDW: 15 % (ref 11.5–15.5)
WBC: 8.3 10*3/uL (ref 4.0–10.5)

## 2014-09-24 MED ORDER — SCOPOLAMINE 1 MG/3DAYS TD PT72: 1.0000 | MEDICATED_PATCH | Freq: Once | TRANSDERMAL | Status: DC

## 2014-09-24 MED ORDER — CEFAZOLIN SODIUM-DEXTROSE 2-3 GM-% IV SOLR: 2.0000 g | INTRAVENOUS | Status: DC

## 2014-09-24 MED ORDER — OXYCODONE-ACETAMINOPHEN 5-325 MG PO TABS: 1.0000 | ORAL_TABLET | ORAL | Status: DC | PRN

## 2014-09-24 MED ORDER — IBUPROFEN 600 MG PO TABS: 600.0000 mg | ORAL_TABLET | Freq: Four times a day (QID) | ORAL | Status: DC | PRN

## 2014-09-24 MED ORDER — LACTATED RINGERS IV SOLN: Freq: Once | INTRAVENOUS | Status: DC

## 2014-09-24 MED ORDER — DOCUSATE SODIUM 100 MG PO CAPS: 100.0000 mg | ORAL_CAPSULE | Freq: Two times a day (BID) | ORAL | Status: DC

## 2014-09-24 NOTE — Lactation Note (Signed)
This note was copied from the chart of Sabrina Chapman. Lactation Consultation Note  Patient Name: Sabrina Chapman EAVWU'JToday's Date: 09/24/2014 Reason for consult: Follow-up assessment (see LC note )  Baby is 6929 hours old and parents have been cleared fr early discharge home by both OB and Pedis, Baby was circ'd this am, and noted to be sluggish at consult and latch with LC , few swallows noted with on an doff pattern , attempt.  Nipple appeared normal when baby released.  LC assessed baby's oral cavity - noted a short labial frenulum above the gum line, and a short anterior frenulum , slightly elevated palate. Mom has erect nipples , compressible areolas .  LC reviewed hand expressing , several colostrum drops noted. LC reviewed basics breast massage , hand express, placing baby skin to skin to skin with feedings awake, when latching him nipple towards upper  lip and wait for wide open mouth , latch with breast compressions until the baby is in a consistent pattern and then intermittent to keep the baby in a good pattern.  Discussed with mom and dad potential feeding patterns of a baby less than 6 pounds and the importance of feeding every 3 hours and with feeding cues. Also per mom has a DEBP at home , LC recommended post pumping 10 -15 mins both breast to enhance breast milk coming in sooner. If the baby at 3 hours is not latching , try appetizer of EBM with a dropper , bay tickling lower lip , wait for the baby to stretch tongue outward and dribble breast milk on tongue  To get the baby interested in eating , if still no latch , use a medium based nipple and give EBM in a bottle. Mom denies sore ness, sore nipple and engorgement prevent and tx . Referring to Baby and me booklet pages 24 -25.  Per mom has a DEBP at home  [facility] and mom declined and per mom will call us when she gets settled at home. She mentioned she came in with her 1st baby for F/U.   Mother informed of post-discharge support and given phone number to the lactation department, including services for phone call assistance; out-patient appointments; and breastfeeding support group. List of other breastfeeding resources in the community given in the handout. Encouraged mother to call for problems or concerns related to breastfeeding.   Maternal Data Has patient been taught Hand Expression?: Yes  Feeding Feeding Type: Breast Fed Length of feed: 10 min (per mom )  LATCH Score/Interventions . On and off pattern sluggish feeding.  Latch: Repeated attempts needed to sustain latch, nipple held in mouth throughout feeding, stimulation needed to elicit sucking reflex. (baby post circ and sluggish ) Intervention(s): Adjust position;Assist with latch;Breast massage;Breast compression  Audible Swallowing: A few with stimulation  Type of Nipple: Everted at rest and after stimulation  Comfort (Breast/Nipple): Soft / non-tender     Hold (Positioning): Assistance needed to correctly position infant at breast and maintain latch. Intervention(s): Breastfeeding basics reviewed;Support Pillows;Position options;Skin to skin  LATCH Score: 7  Lactation Tools Discussed/Used Pump Review: Milk Storage Initiated by:: MAI  Date initiated:: 09/24/14   Consult Status Consult Status: Complete Date: 09/24/14 Follow-up type: In-patient    Sabrina Chapman, Sabrina Chapman 09/24/2014, 1:00 PM

## 2014-09-24 NOTE — Progress Notes (Signed)
Subjective: Postpartum Day 1: Cesarean Delivery Patient reports pain controlled with motrin 2-3/10. No nausea, no vomiting. Baby feeding well. Baby has a D/C  Objective: Vital signs in last 24 hours: Temp:  [97.8 F (36.6 C)-99.1 F (37.3 C)] 98.2 F (36.8 C) (01/23 0538) Pulse Rate:  [60-68] 66 (01/23 0538) Resp:  [16-20] 18 (01/23 0538) BP: (104-153)/(69-92) 128/70 mmHg (01/23 0538) SpO2:  [96 %-98 %] 97 % (01/23 0538)  Physical Exam:  General: alert, cooperative and appears stated age Lochia: appropriate Uterine Fundus: firm Incision: healing well DVT Evaluation: No evidence of DVT seen on physical exam.   Recent Labs  09/23/14 0320 09/24/14 0622  HGB 11.3* 9.3*  HCT 32.7* 27.9*    Assessment/Plan: Status post Cesarean section. Doing well postoperatively.  Sabrina Chapman.  Sabrina Chapman H. 09/24/2014, 10:43 AM

## 2014-09-24 NOTE — Discharge Summary (Signed)
Obstetric Discharge Summary Reason for Admission: rupture of membranes Prenatal Procedures: NST, ultrasound and CVS, NIPT Intrapartum Procedures: cesarean: low cervical, transverse and bilateral salpingectomy Postpartum Procedures: none Complications-Operative and Postpartum: none HEMOGLOBIN  Date Value Ref Range Status  09/24/2014 9.3* 12.0 - 15.0 g/dL Final    Comment:    REPEATED TO VERIFY DELTA CHECK NOTED    HCT  Date Value Ref Range Status  09/24/2014 27.9* 36.0 - 46.0 % Final    Physical Exam:  General: alert, cooperative and appears stated age 46Lochia: appropriate Uterine Fundus: firm Incision: healing well DVT Evaluation: No evidence of DVT seen on physical exam.  Discharge Diagnoses: Term Pregnancy-delivered, AMA, Pregnancy complicated by possible exposure to Zika virus. Possible Klinefelter Syndrome on NIPT  Discharge Information: Date: 09/24/2014 Activity: pelvic rest Diet: routine Medications: Ibuprofen, Colace and Percocet Condition: improved Instructions: refer to practice specific booklet Discharge to: home Follow-up Information    Follow up with HORVATH,MICHELLE A, MD In 4 weeks.   Specialty:  Obstetrics and Gynecology   Why:  fopr a postoperative evaluation   Contact information:   [address] [place] [id number] [phone]       Newborn Data: Live born female  Birth Weight: 5 lb 13.1 oz (2640 g) APGAR: 9, 9  Home with mother.  Quenesha Douglass H. 09/24/2014, 10:40 AM

## 2014-09-26 ENCOUNTER — Encounter (HOSPITAL_COMMUNITY): Payer: Self-pay | Admitting: Obstetrics and Gynecology

## 2014-10-04 ENCOUNTER — Ambulatory Visit (HOSPITAL_COMMUNITY)
Admission: RE | Admit: 2014-10-04 | Discharge: 2014-10-04 | Disposition: A | Discharge status: Home / Self Care | Payer: BLUE CROSS/BLUE SHIELD | Source: Ambulatory Visit | Attending: Obstetrics and Gynecology | Admitting: Obstetrics and Gynecology

## 2014-10-04 NOTE — Lactation Note (Signed)
Lactation Consult  Mother's reason for visit:  Low milk supply  Visit Type: feeding assessment due to slow weight gain  Appointment Notes: slow weight gain  Consult:  Initial Lactation Consultant:  Kathrin Greathouse  ________________________________________________________________________  Sabrina Chapman Name: Sabrina Chapman Date of Birth: 09/23/2013 Pediatrician: Dr. Nelda Marseille  Gender: female Gestational Age: [redacted]w[redacted]d (At Birth) Birth Weight: 5 lb 13.1 oz (2640 g) Weight at Discharge: Weight: 5 lb 10 oz (2550 g)Date of Discharge: 09/24/2014 [facility] Weights   09/23/14 0752 09/23/14 2339  Weight: 5 lb 13.1 oz (2640 g) 5 lb 10 oz (2550 g)   [facility] 2/1 5-3 oz  Location:Pediatrician's office Weight today: 5-0.5 oz , 2412g    ________________________________________________________________________  Mother's Name: Sabrina Chapman Type of delivery:  C/section  Breastfeeding Experience:  Per mom low milk supply with 1st baby also , was only able to pump 1-2 oz off breast @ a time  Maternal Medical Conditions:  No risk for milk supply  Maternal Medications: PNV   ________________________________________________________________________  Breastfeeding History (Post Discharge)  Frequency of breastfeeding:  4x's a day , 2X's at night  Duration of feeding: 5-10 mins ,   Supplement: yes , with Similiac advanced with a syringe 15 -30 ml ( per mom it takes a long time )   Pumping : 2-3 x's a day for 15 - 30 mins  EBM yield - 1-2 oz   Infant Intake and Output Assessment  Voids:  6-8  in 24 hrs.  Color:  Clear yellow Stools:  2  in 24 hrs.  Color:  [name]   ________________________________________________________________________  Maternal Breast Assessment - per mom milk came in Monday - Tuesday after delivery , no issues with engorgement or soreness.   Breast:  Soft and  Filling Nipple:  Erect Pain level:  0 Pain interventions:  Expressed breast milk  _______________________________________________________________________ Feeding Assessment/Evaluation  Initial feeding assessment: Baby awake and rooting , spitty at 1st , clear secretions. Mom mentioned - he has been doing that at home also.                                                  LC used the bulb syringe, and it cleared. Color pink. No jaundice noted.   Infant's oral assessment:  Variance see LC impression below   Positioning:  Football Right breast  LATCH documentation:  Latch:  1 = Repeated attempts needed to sustain latch, nipple held in mouth throughout feeding, stimulation needed to elicit sucking reflex.  Audible swallowing:  1 = A few with stimulation  Type of nipple:  2 = Everted at rest and after stimulation  Comfort (Breast/Nipple):  2 = Soft / non-tender to filling   Hold (Positioning):  1 = Assistance needed to correctly position infant at breast and maintain latch  LATCH score:  7   Attached assessment:  Shallow  Lips flanged:  Yes.    Lips untucked:  Yes.    Suck assessment:  Nonnutritive  Tools: added #20 NS  Instructed on use and cleaning of tool:  Yes.    Pre-feed weight: 2412g  g , 5-0.5.1  oz  Post-feed weight:  2422g , 5-0.5.5 Amount transferred: 8 ml  Amount supplemented:  2ml inside the tip of the  nipple shield   Limited the feeding the breast for 15 mins and supplemented after with a bottle with premie nipple  Sabrina Chapman had difficulty at 1st gripping the nipple, needed palate stimulation with bottle nipple to get the  Baby in a consistent pattern.    Total amount pumped post feed:  Did not post pump   Total amount transferred: 10 ml ( 8 ml from mom and 2 ml of formula instilled into the top of the Nipple shield )  Total supplement given:  30 ml of formula from a bottle  Total for the feeding : 40 ml   Lactation Impression : Baby is losing weight ( from  pedis apt 2/1 5-3 oz )  Today's weight - 5-0.5 oz  Short labial frenulum and possible short posterior frenulum noted. Latches at the breast skin to skin , but doesn't sustain a latch for only a few sucks  Latched well with #20 NS and sustained the latch for 15 mins with multiply swallows,  Tolerated Bottle feeding once Sabrina Chapman was able to get in a consistent pattern. Took 30 ml  Sabrina Chapman energy level improved greatly after a feeding of 40 ml  LC feels moms milk supply is low due to lack of consistent stimulation form the baby and pumping  Mom is doing the best she can considering the circumstances of her Mother dying recently. Also recovering from a C/section. Easy to hand express milk.   Lactation Plan of care: Praised mom for keeping the [facility] O/P today  Breast feeding goals - work on milk supply with breast with a NS and pumping  Infants goals - to increase weight 1/2 oz -1 oz per day until Sabrina Chapman is back up to birth weight  Protect Sabrina Chapman's energy level so he will be more efficient with milk transfer from breast and bottle.    Mom - Plenty fluids , especially water , nutritious snacks and meals , rest / naps  Feedings- by 3 hours if [employer] showing feeding cues - wake him , check and change diaper , feed skin to skin.  ( may eat earlier than 3 hours , especially with growth spurts and gaining weight back)  Watch for non - Nutritive feeding patterns. Option #1 - Feed Sabrina Chapman @ the breast with #20 NS , ( instilling EBM of formula into the top of the NS) , Feed only 15 -20 mins max at the breast ,  and supplement with EBM or formula 30 -45 ml , increasing to 60 ml gradually in the next 3 days  Option #2 - Feed Sabrina Chapman with a Dr. Manson Passey bottle ( medium base nipple ) 45-60 ml ( goal )  Due to [facility] being use to less and small amounts , it will probably be gradual  Today at least 30 -45 ml. Consistency is the key to weight gain . Stressed to mom to only supplement with a bottle  until the baby is gaining and energy is increasing. ( NO SYRINGE FEEDINGS )  Post pump both breast with DEBP for 15 -20 mins max. Save milk and feed back to [employer]   F/U apt . Offered Thursday - Per mom due to her Mother's funeral tomorrow , and ashes be spread on Thursday.  Mom agreed to [facility] F/U Friday 2/5 230 pm for weight check and milk supply to be reevaluated.  LC  recommended calling smart start for weight check for Monday Feb.8th and at the [facility] Friday apt another LC F/U needs to  be set up.   This LC feels this baby frenulum needs to addressed!   This LC called Dr. Nelda Marseillearey Williams office and spoke to the nurse regarding above concerns [facility] 1655 [facility]

## 2014-10-05 ENCOUNTER — Ambulatory Visit (INDEPENDENT_AMBULATORY_CARE_PROVIDER_SITE_OTHER): Payer: BLUE CROSS/BLUE SHIELD | Admitting: Family Medicine

## 2014-10-05 ENCOUNTER — Encounter: Payer: Self-pay | Admitting: Family Medicine

## 2014-10-05 VITALS — BP 130/82 | HR 84 | Temp 97.7°F | Ht 66.0 in | Wt 129.0 lb

## 2014-10-05 DIAGNOSIS — R05 Cough: Secondary | ICD-10-CM | POA: Diagnosis not present

## 2014-10-05 DIAGNOSIS — R059 Cough, unspecified: Secondary | ICD-10-CM

## 2014-10-05 DIAGNOSIS — J309 Allergic rhinitis, unspecified: Secondary | ICD-10-CM

## 2014-10-05 NOTE — Progress Notes (Signed)
Chief Complaint  Patient presents with  . Cough    x 1 week and has worsened over the last few day. White mucus, PND.    She has been coughing for a week, and having postnasal drainage.  Unable to expectorate, but feels like a wet cough, not dry and hacky.  Cough is worse at night when lying down.  She hasn't taken any OTC medications.  No sniffling, sneezing, no heartburn.  No fevers, chills. Denies shortness of breath.  Daughter has also had a cough x 3 weeks, and her husband has also been coughing x 2 weeks.  She recently had repeat c-section, and is currently breast-feeding. Was having some trouble, but saw lactation consultant yesterday, and doing better today.  Her mother recently lost her long-fought battle with ovarian cancer.  Patient was allowed to return home after only 24 hrs (after c/s) in order for her mother to see her and the baby before she died.  The celebration of life service is this afternoon.  She is speaking, and is hoping not to cough.  Past Medical History  Diagnosis Date  . Allergy     seasonal, plus cats, dust  . IBS (irritable bowel syndrome)   . AMA (advanced maternal age) multigravida 35+   . Anxiety in her early 20's    took Paxil  . GERD (gastroesophageal reflux disease)    Past Surgical History  Procedure Laterality Date  . Sinus surgery      polyp removal  . Dilation and curettage of uterus  2008  . Cesarean section  07/15/2012    Procedure: CESAREAN SECTION;  Surgeon: Loney Laurence, MD;  Location: [facility];  Service: Obstetrics;  Laterality: N/A;  . Cesarean section N/A 09/23/2014    Procedure: CESAREAN SECTION;  Surgeon: Loney Laurence, MD;  Location: [facility];  Service: Obstetrics;  Laterality: N/A;   History   Social History  . Marital Status: Married    Spouse Name: N/A    Number of Children: 1  . Years of Education: N/A   Occupational History  . 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 EXECUTIVE DIRECTOR      Social History Main Topics  . Smoking status: Former Smoker    Quit date: 09/02/1997  . Smokeless tobacco: Never Used  . Alcohol Use: No     Comment: 1-2 drinks every other day when not pregnant  . Drug Use: No  . Sexual Activity: Yes    Birth Control/ Protection: None   Other Topics Concern  . Not on file   Social History Narrative   Married, 1 daughter Lauris Poag, 1 son [name]     Family History  Problem Relation Age of Onset  . Cancer Mother     breast , ovarian   . Hyperlipidemia Mother   . Heart disease Mother     by pass surgery  . Breast cancer Mother 34  . Ovarian cancer Mother 25    metastatic to abd/omentum (diagnosed after finding pleural effusions)  . Hyperlipidemia Father   . Allergies Father   . Depression Father   . Cancer Maternal Grandmother 50    breast cancer  . Heart disease Maternal Grandfather   . Depression Paternal Grandmother   . Diabetes Neg Hx    Outpatient Encounter Prescriptions as of 10/05/2014  Medication Sig  . docusate sodium (COLACE) 100 MG capsule Take 1 capsule (100 mg total) by mouth 2 (two) times daily.  Marland Kitchen ibuprofen (  ADVIL,MOTRIN) 600 MG tablet Take 1 tablet (600 mg total) by mouth every 6 (six) hours as needed.  Marland Kitchen. oxyCODONE-acetaminophen (ROXICET) 5-325 MG per tablet Take 1-2 tablets by mouth every 4 (four) hours as needed for severe pain. (Patient not taking: Reported on 10/05/2014)   Allergies  Allergen Reactions  . Citric Acid Other (See Comments)    GI symptoms  . Fructose Other (See Comments)    Lower G.I. symptoms    ROS:  No fevers, chills, headaches, dizziness, sore throat, chest pain, shortness of breath. No nausea, vomiting, diarrhea. No bleeding, bruising, rash or other complaints.  Moods have been okay.  Sleep has been poor related to newborn and recent death of mother, and now cough.  PHYSICAL EXAM: BP 130/82 mmHg  Pulse 84  Temp(Src) 97.7 F (36.5 C) (Tympanic)  Ht 5\' 6"  (1.676 m)  Wt 129 lb (58.514 kg)   BMI 20.83 kg/m2  LMP 12/24/2013  Breastfeeding? Yes  Well appearing female, rare cough, in no distress. HEENT: PERRL, EOMI, conjunctiva clear.  Nasal mucosa is mildly-mod edematous, pale, with clear mucus.  OP is clear.  Sinuses are nontender.  TM's and EAC's are normal. Neck: no lymphadenopathy, thyromegaly or mass Heart: regular rate and rhythm Lungs: clear bilaterally. No wheezes, rales, ronchi Skin: no rash Psych: full range of affect. Normal hygiene, grooming, speech, eye contact.  Intermittently a little teary.  ASSESSMENT/PLAN:  Cough  Allergic rhinitis, unspecified allergic rhinitis type  Reassured no evidence of bacterial infection.  Cough likely related to PND, likely related to allergies. Reviewed OTC medications and their safety in lactation.  Okay to use dextromethorphan prn, and claritin prn.   Drink plenty of fluid It appears that your cough is likely related to postnasal drainage.  Clear mucus was seen in the nose.  Try taking Claritin once daily (plain, not the D version). Use Delsym syrup (dextromethorphan--cough suppressant). If mucus becomes very thick, add guaifenesin (expectoran in Mucinex, robitussin)  Call if you develop fevers, discolored mucus, sinus pain, shortness of breath or other problems.

## 2014-10-05 NOTE — Patient Instructions (Signed)
  Drink plenty of fluid It appears that your cough is likely related to postnasal drainage.  Clear mucus was seen in the nose.  Try taking Claritin once daily (plain, not the D version). Use Delsym syrup (dextromethorphan--cough suppressant). If mucus becomes very thick, add guaifenesin (expectoran in Mucinex, robitussin)  Call if you develop fevers, discolored mucus, sinus pain, shortness of breath or other problems.

## 2014-10-07 ENCOUNTER — Ambulatory Visit (HOSPITAL_COMMUNITY)
Admission: RE | Admit: 2014-10-07 | Discharge: 2014-10-07 | Disposition: A | Discharge status: Home / Self Care | Payer: BLUE CROSS/BLUE SHIELD | Source: Ambulatory Visit | Attending: Obstetrics and Gynecology | Admitting: Obstetrics and Gynecology

## 2014-10-07 ENCOUNTER — Telehealth: Payer: Self-pay | Admitting: Internal Medicine

## 2014-10-07 MED ORDER — CIPROFLOXACIN HCL 250 MG PO TABS: 250.0000 mg | ORAL_TABLET | Freq: Two times a day (BID) | ORAL | Status: DC

## 2014-10-07 NOTE — Telephone Encounter (Signed)
Patient advised.

## 2014-10-07 NOTE — Lactation Note (Signed)
Lactation Consult [name] Date of Birth: 09/23/2013 Pediatrician: Mayford KnifeWilliams Gender: female Gestational Age: 654w0d (At Birth) Birth Weight: 5 lb 13.1 oz (2640 g) Weight at Discharge: Weight: 5 lb 10 oz (2550 g)Date of Discharge: 09/24/2014 [facility]   09/23/14 0752 09/23/14 2339  Weight: 5 lb 13.1 oz (2640 g) 5 lb 10 oz (2550 g)    Mother's reason for visit:  Follow up from earlier this week Visit Type:  Feeding assessment Appointment Notes:  Grandmother passed away- funeral was yesterday Consult:  Follow-Up Lactation Consultant:  Audry RilesWeeks, [address]  ________________________________________________________________________    ________________________________________________________________________  Mother's Name: Sabrina Chapman Type of delivery:   Breastfeeding Experience:  p2 BF for 12 [name]- low milk supply with first ________________________________________________________________________  Breastfeeding History (Post Discharge)  Frequency of breastfeeding:  Has been bottle feeding formula for the past several days Duration of feeding:  NA  Supplementation  Formula:  Volume 60 ml Frequency:  q 2-3         Breastmilk:  Volume 30 ml Frequency:  1/day Total volume per day:  30 ml  Method:  Bottle,   Pumping  Type of pump:  Medela pump in style Frequency:  1/ day Volume:  30 ml  Infant Intake and Output Assessment  Voids:  6 in 24 hrs.  Color:  Clear yellow Stools:  0 in 24 hrs.  MD aware ________________________________________________________________________  Maternal Breast Assessment  Breast:  Soft Nipple:  Erect Pain level:  0 Pain interventions:  NA  _______________________________________________________________________ Feeding Assessment/Evaluation  Initial feeding assessment:  Infant's oral assessment:  WNL  Positioning:  Football Right breast  LATCH documentation:  Latch:  2 = Grasps breast  easily, tongue down, lips flanged, rhythmical sucking.  Audible swallowing:  1 = A few with stimulation- very few swallows the first few minutes  Type of nipple:  2 = Everted at rest and after stimulation  Comfort (Breast/Nipple):  2 = Soft / non-tender  Hold (Positioning):  1 = Assistance needed to correctly position infant at breast and maintain latch  LATCH score:  8  Attached assessment:  Deep  Lips flanged:  Yes.    Lips untucked:  No.  Suck assessment:  Nonnutritive   Pre-feed weight:  2574 g  (5 lb. 10.8 oz.) Post-feed weight:  2626 g (5 lb. 12.6 oz.) Amount transferred:  2 ml Amount supplemented:  50 ml  Attempted to latch [name] but he was too fussy. Mom had EBM with her so we fed about 10 cc's until he calmed. Latched to breast and nursed for about 20 min- mostly non nutritive. Mom pleased that he did latch without NS. After nursing bottle fed 50 more cc's EBM. Mom has only pumped once yesterday and once today- obtained about 30 cc's. Grandmother has passed away and she has been busy with funeral and family. Encouraged to try to latch [name], pump q 3 hours as able and to continue to supplement after every feeding with EBM or formula. Discussed Fenugreek to increase supply. No questions at present. Suggested weight check- feels she does not need another appointment here. To call Ped or attend [facility] next week.     Total amount transferred:  2 ml Total supplement given:  50 ml

## 2014-10-07 NOTE — Telephone Encounter (Signed)
Advise pt that cipro prescription was sent to her pharmacy. This should be safe for breastfeeding. She needs to follow up next week if symptoms persist. Follow up sooner (urgent care) if high fever, flank pain, vomiting or other, worsening symptoms develop despite using this antibiotic.

## 2014-10-07 NOTE — Telephone Encounter (Signed)
Pt states she has a UTI. She has cloudy urine, pain with urinating and not urinating, she doesn't have any blood but she would like something sent in. [facility] [address]

## 2015-04-27 ENCOUNTER — Other Ambulatory Visit: Payer: Self-pay | Admitting: Obstetrics and Gynecology

## 2015-04-28 LAB — CYTOLOGY - PAP

## 2015-05-01 LAB — HM PAP SMEAR: HM Pap smear: NORMAL

## 2015-05-10 ENCOUNTER — Encounter: Payer: Self-pay | Admitting: *Deleted

## 2015-07-25 ENCOUNTER — Encounter: Payer: Self-pay | Admitting: Medical

## 2015-07-25 ENCOUNTER — Ambulatory Visit (INDEPENDENT_AMBULATORY_CARE_PROVIDER_SITE_OTHER): Payer: BLUE CROSS/BLUE SHIELD | Admitting: Medical

## 2015-07-25 VITALS — BP 120/80 | HR 78 | Temp 98.2°F | Wt 131.0 lb

## 2015-07-25 DIAGNOSIS — J0100 Acute maxillary sinusitis, unspecified: Secondary | ICD-10-CM | POA: Diagnosis not present

## 2015-07-25 MED ORDER — AMOXICILLIN 500 MG PO TABS: ORAL_TABLET | ORAL | Status: DC

## 2015-07-25 NOTE — Progress Notes (Signed)
Subjective: Chief Complaint  Patient presents with  . sinus infection?    said that she is having dark colored draining, been going on for a month. said that if she sniffs up that she gets a sharp pain in her head. her taste and smell is not working.     Sabrina Chapman is a 44 y.o. female who presents for possible sinus infection.  Has had head congestion and sinus pressure for a month now, colored mucous consistently, headache, ears popping, sinus headaches, head congestion particular in the morning, some cough, had more nose dripping initially but now more thick mucous.   Hasn't used anything but nettie pot and that seemed to make things worse over time.  No other aggravating or relieving factors. No other complaint.  Denies fever, nausea, vomiting, SOB.  No sick contacts.  Patient is not a smoker.  No other aggravating or relieving factors.  No other c/o.  Past Medical History  Diagnosis Date  . Allergy     seasonal, plus cats, dust  . IBS (irritable bowel syndrome)   . AMA (advanced maternal age) multigravida 35+   . Anxiety in her early 20's    took Paxil  . GERD (gastroesophageal reflux disease)     ROS as in subjective   Objective: BP 120/80 mmHg  Pulse 78  Wt 131 lb (59.421 kg)  LMP 07/18/2015  Breastfeeding? No  General appearance: Alert, WD/WN, no distress                             Skin: warm, no rash                           Head: + maxillary sinus tenderness,                            Eyes: conjunctiva normal, corneas clear, PERRLA                            Ears: pearly TMs, external ear canals normal                          Nose: septum midline, turbinates swollen, with erythema and clear discharge             Mouth/throat: MMM, tongue normal, mild pharyngeal erythema                           Neck: supple, no adenopathy, no thyromegaly, non tender                          Heart: RRR, normal S1, S2, no murmurs                         Lungs: CTA  bilaterally, no wheezes, rales, or rhonchi      Assessment  Encounter Diagnosis  Name Primary?  . Acute maxillary sinusitis, recurrence not specified Yes      Plan: Discussed diagnosis, symptoms, treatment, and begin amoxicillin  Specific home care recommendations today include:  Only take over-the-counter (OTC) or prescription medicines for pain, discomfort, or fever as directed by your caregiver.    Decongestant: You may use OTC Guaifenesin (Mucinex  plain) for congestion.  You may use Pseudoephedrine (Sudafed) only if you don't have blood pressure problems or a diagnosis of hypertension.  Cough suppression: If you have cough from drainage, you may use over-the-counter Dextromethorphan (Delsym) as directed on the label  Pain/fever relief: You may use over-the-counter Tylenol for pain or fever  Drink extra fluids. Fluids help thin the mucus so your sinuses can drain more easily.   Applying either moist heat or ice packs to the sinus areas may help relieve discomfort.  Use saline nasal sprays to help moisten your sinuses. The sprays can be found at your local drugstore.   Prescription (s) given: Denny Peonrin was seen today for sinus infection?.  Diagnoses and all orders for this visit:  Acute maxillary sinusitis, recurrence not specified  Other orders -     amoxicillin (AMOXIL) 500 MG tablet; 2 tablets po BID x 10 days   Patient was advised to call or return if worse or not improving in the next few days.    Patient voiced understanding of diagnosis, recommendations, and treatment plan.

## 2015-11-11 ENCOUNTER — Telehealth: Payer: Self-pay

## 2015-11-11 ENCOUNTER — Ambulatory Visit (INDEPENDENT_AMBULATORY_CARE_PROVIDER_SITE_OTHER): Payer: 59 | Admitting: Physician Assistant

## 2015-11-11 VITALS — BP 130/86 | HR 83 | Temp 98.4°F | Resp 16 | Ht 66.0 in | Wt 124.0 lb

## 2015-11-11 DIAGNOSIS — H60392 Other infective otitis externa, left ear: Secondary | ICD-10-CM

## 2015-11-11 DIAGNOSIS — R05 Cough: Secondary | ICD-10-CM | POA: Diagnosis not present

## 2015-11-11 DIAGNOSIS — R059 Cough, unspecified: Secondary | ICD-10-CM

## 2015-11-11 DIAGNOSIS — H66002 Acute suppurative otitis media without spontaneous rupture of ear drum, left ear: Secondary | ICD-10-CM | POA: Diagnosis not present

## 2015-11-11 MED ORDER — CIPROFLOXACIN HCL 0.2 % OT SOLN: 0.2000 mL | Freq: Two times a day (BID) | OTIC | Status: AC

## 2015-11-11 MED ORDER — AMOXICILLIN-POT CLAVULANATE 875-125 MG PO TABS
1.0000 | ORAL_TABLET | Freq: Two times a day (BID) | ORAL | Status: DC
Start: 2015-11-11 — End: 2015-11-20

## 2015-11-11 MED ORDER — IPRATROPIUM BROMIDE 0.03 % NA SOLN: 2.0000 | Freq: Two times a day (BID) | NASAL | Status: DC

## 2015-11-11 MED ORDER — HYDROCOD POLST-CPM POLST ER 10-8 MG/5ML PO SUER: 5.0000 mL | Freq: Two times a day (BID) | ORAL | Status: DC | PRN

## 2015-11-11 NOTE — Telephone Encounter (Signed)
Pt is having trouble getting the medication that was prescribed for the ear and would like to know if there is something over the counter that will help  Best number [phone]

## 2015-11-11 NOTE — Patient Instructions (Signed)
Otitis Media, Adult °Otitis media is redness, soreness, and inflammation of the middle ear. Otitis media may be caused by allergies or, most commonly, by infection. Often it occurs as a complication of the common cold. °SIGNS AND SYMPTOMS °Symptoms of otitis media may include: °· Earache. °· Fever. °· Ringing in your ear. °· Headache. °· Leakage of fluid from the ear. °DIAGNOSIS °To diagnose otitis media, your health care provider will examine your ear with an otoscope. This is an instrument that allows your health care provider to see into your ear in order to examine your eardrum. Your health care provider also will ask you questions about your symptoms. °TREATMENT  °Typically, otitis media resolves on its own within 3-5 days. Your health care provider may prescribe medicine to ease your symptoms of pain. If otitis media does not resolve within 5 days or is recurrent, your health care provider may prescribe antibiotic medicines if he or she suspects that a bacterial infection is the cause. °HOME CARE INSTRUCTIONS  °· If you were prescribed an antibiotic medicine, finish it all even if you start to feel better. °· Take medicines only as directed by your health care provider. °· Keep all follow-up visits as directed by your health care provider. °SEEK MEDICAL CARE IF: °· You have otitis media only in one ear, or bleeding from your nose, or both. °· You notice a lump on your neck. °· You are not getting better in 3-5 days. °· You feel worse instead of better. °SEEK IMMEDIATE MEDICAL CARE IF:  °· You have pain that is not controlled with medicine. °· You have swelling, redness, or pain around your ear or stiffness in your neck. °· You notice that part of your face is paralyzed. °· You notice that the bone behind your ear (mastoid) is tender when you touch it. °MAKE SURE YOU:  °· Understand these instructions. °· Will watch your condition. °· Will get help right away if you are not doing well or get worse. °  °This  information is not intended to replace advice given to you by your health care provider. Make sure you discuss any questions you have with your health care provider. °  °Document Released: 05/24/2004 Document Revised: 09/09/2014 Document Reviewed: 03/16/2013 °Elsevier Interactive Patient Education ©2016 [employer] ° °Otitis Externa °Otitis externa is a bacterial or fungal infection of the outer ear canal. This is the area from the eardrum to the outside of the ear. Otitis externa is sometimes called "swimmer's ear." °CAUSES  °Possible causes of infection include: °· Swimming in dirty water. °· Moisture remaining in the ear after swimming or bathing. °· Mild injury (trauma) to the ear. °· Objects stuck in the ear (foreign body). °· Cuts or scrapes (abrasions) on the outside of the ear. °SIGNS AND SYMPTOMS  °The first symptom of infection is often itching in the ear canal. Later signs and symptoms may include swelling and redness of the ear canal, ear pain, and yellowish-white fluid (pus) coming from the ear. The ear pain may be worse when pulling on the earlobe. °DIAGNOSIS  °Your health care provider will perform a physical exam. A sample of fluid may be taken from the ear and examined for bacteria or fungi. °TREATMENT  °Antibiotic ear drops are often given for 10 to 14 days. Treatment may also include pain medicine or corticosteroids to reduce itching and swelling. °HOME CARE INSTRUCTIONS  °· Apply antibiotic ear drops to the ear canal as prescribed by your health care provider. °·   Take medicines only as directed by your health care provider. °· If you have diabetes, follow any additional treatment instructions from your health care provider. °· Keep all follow-up visits as directed by your health care provider. °PREVENTION  °· Keep your ear dry. Use the corner of a towel to absorb water out of the ear canal after swimming or bathing. °· Avoid scratching or putting objects inside your ear. This can damage the ear  canal or remove the protective wax that lines the canal. This makes it easier for bacteria and fungi to grow. °· Avoid swimming in lakes, polluted water, or poorly chlorinated pools. °· You may use ear drops made of rubbing alcohol and vinegar after swimming. Combine equal parts of white vinegar and alcohol in a bottle. Put 3 or 4 drops into each ear after swimming. °SEEK MEDICAL CARE IF:  °· You have a fever. °· Your ear is still red, swollen, painful, or draining pus after 3 days. °· Your redness, swelling, or pain gets worse. °· You have a severe headache. °· You have redness, swelling, pain, or tenderness in the area behind your ear. °MAKE SURE YOU:  °· Understand these instructions. °· Will watch your condition. °· Will get help right away if you are not doing well or get worse. °  °This information is not intended to replace advice given to you by your health care provider. Make sure you discuss any questions you have with your health care provider. °  °Document Released: 08/19/2005 Document Revised: 09/09/2014 Document Reviewed: 09/05/2011 °Elsevier Interactive Patient Education ©2016 Elsevier Inc. ° °

## 2015-11-11 NOTE — Progress Notes (Signed)
Subjective:    Patient ID: [place], female    DOB: [birth year], 45 y.o.   MRN: [id number]  Chief Complaint  Patient presents with  . Cough    x 3 days  . Nasal Congestion  . Ear Pain    left, since yesterday, minor blood   HPI  Presents today for evaluation of ear pain x1 day and cough x3 days.  Complains of significant left ear pain that started yesterday afternoon. Pain is described as constant. It is "inside" the ear and goes down into her jaw and behind her ear. 6/10 pain. Has had some clear drainage with a pink/red tinge. Pain was so bad she was unable to sleep. Reports decreased hearing in left ear. Also reprots some dizziness when standing. "Can't get balance right."  3 days ago started feeling sick with sinus pressure, congestion, rhinorhea, and PND. Also has a productive cough with significant amounts of dark yellow/green mucus. Feels like there's "something in there" she's trying to cough up. Had a sore throat but associated that to the PND and coughing. Reports myalgias that started today but thinks made worse by not getting any sleep.   Has had several sick contacts. Works in [employer]. Has a 38mo and 44 year old in daycare. 62th month old has had some URI symptoms with rhinorhea and cough.   Denies chest tightness, SOB, chest pain, or hemoptysis. Denies recent swimming or air plane travel. Is flying to [place] Friday and wants her ear pain cleared up before she gets on a plane.   Has tried a generic cough/sinus medication with no relief. Also reports using a netti pot with some relief.   Allergies  Allergen Reactions  . Citric Acid Other (See Comments)    GI symptoms  . Fructose Other (See Comments)    Lower G.I. symptoms   Prior to Admission medications   Not on File    Review of Systems  Constitutional: Positive for fatigue. Negative for fever and chills.  HENT: Positive for congestion, ear discharge, ear pain, hearing loss, postnasal drip,  rhinorrhea, sinus pressure, sore throat and voice change (hoarseness).   Eyes: Negative for pain, discharge, itching and visual disturbance.  Respiratory: Positive for cough. Negative for chest tightness, shortness of breath and wheezing.   Cardiovascular: Negative for chest pain.  Gastrointestinal: Negative for nausea, vomiting, abdominal pain and diarrhea.  Musculoskeletal: Positive for myalgias.  Neurological: Positive for dizziness. Negative for light-headedness and headaches.       Objective:   Physical Exam  Constitutional: She is oriented to person, place, and time. She appears well-developed and well-nourished. No distress.  Blood pressure 130/86, pulse 83, temperature 98.4 F (36.9 C), resp. rate 16, height  (1.676 m), weight 124 lb (56.246 kg), last menstrual period 11/04/2015, SpO2 99 %, not currently breastfeeding.   HENT:  Head: Normocephalic and atraumatic.  Right Ear: Hearing, tympanic membrane, external ear and ear canal normal.  Left Ear: There is drainage and tenderness. Tympanic membrane is retracted. Decreased hearing is noted.  Mouth/Throat: Oropharynx is clear and moist. No oropharyngeal exudate.  Tender to palpation of L tragus, posterior ear, and cervical lymph nodes. L ear canal appears erythematous, edematous, and irritated.  L ear drum is retracted with some blood behind the ear drum. No perforation is noted.   Eyes: Conjunctivae and EOM are normal. Pupils are equal, round, and reactive to light. Right eye exhibits no discharge. Left eye exhibits no discharge. No scleral icterus.  Neck: Normal range of motion. Neck supple. No thyromegaly present.  Cardiovascular: Normal rate, regular rhythm and intact distal pulses.  Exam reveals no gallop and no friction rub.   No murmur heard. Pulmonary/Chest: Effort normal and breath sounds normal. She has no wheezes. She has no rales. She exhibits no tenderness.  Musculoskeletal: Normal range of motion.  Neurological:  She is alert and oriented to person, place, and time.  Skin: Skin is warm and dry.      Assessment & Plan:  1. Cough Most likely a viral UTI. Will provide tussionex for supportive treatment.  - chlorpheniramine-HYDROcodone (TUSSIONEX PENNKINETIC ER) 10-8 MG/5ML SUER; Take 5 mLs by mouth every 12 (twelve) hours as needed for cough.  Dispense: 100 mL; Refill: 0  2. Acute suppurative otitis media of left ear without spontaneous rupture of tympanic membrane, recurrence not specified Evidence of AOM with significant pain, retracted TM and blood behind it. Will treat with oral augmentin and atrovent.   - amoxicillin-clavulanate (AUGMENTIN) 875-125 MG tablet; Take 1 tablet by mouth 2 (two) times daily.  Dispense: 20 tablet; Refill: 0 - ipratropium (ATROVENT) 0.03 % nasal spray; Place 2 sprays into both nostrils 2 (two) times daily.  Dispense: 30 mL; Refill: 0  3. Otitis, externa, infective, left Evidnce of OE with pain to palpation of tragus/outer ear, as well as visual inspection of ear canal. Will treat with ciprofloxacin ear drops.  - Ciprofloxacin HCl 0.2 % otic solution; Place 0.2 mLs into the left ear 2 (two) times daily.  Dispense: 14 vial; Refill: 0   Instructed patient on proper use of medication and possible side effects. Provided advice on air travel and ways to help diminish discomfort for her flight next week. Instructed to return if symptoms worsen or persist.  Discussed assessment and plan with patient. They expressed their understanding and acceptance.   Hilton CorkS. Dani Nyrah Demos PA-S [facility]

## 2015-11-11 NOTE — Progress Notes (Signed)
Subjective:   Patient ID: Sabrina Chapman, female     DOB: 1971-07-20, 45 y.o.    MRN: [id number]  PCP: Lavonda Jumbo, MD  Chief Complaint  Patient presents with  . Cough    x 3 days  . Nasal Congestion  . Ear Pain    left, since yesterday, minor blood    HPI  Presents today for evaluation of ear pain x1 day and cough x3 days.  Complains of significant left ear pain that started yesterday afternoon. Pain is described as constant. It is "inside" the ear and goes down into her jaw and behind her ear. 6/10 pain. Has had some clear drainage with a pink/red tinge. Pain was so bad she was unable to sleep. Reports decreased hearing in left ear. Also reprots some dizziness when standing. "Can't get balance right."  3 days ago started feeling sick with sinus pressure, congestion, rhinorhea, and PND. Also has a productive cough with significant amounts of dark yellow/green mucus. Feels like there's "something in there" she's trying to cough up. Had a sore throat but associated that to the PND and coughing. Reports myalgias that started today but thinks made worse by not getting any sleep.   Has had several sick contacts. Works in [employer]. Has a 27mo and 44 year old in daycare. 31th month old has had some URI symptoms with rhinorhea and cough.   Denies chest tightness, SOB, chest pain, or hemoptysis. Denies recent swimming or air plane travel. Is flying to [place] Friday and wants her ear pain cleared up before she gets on a plane.   Has tried a generic cough/sinus medication with no relief. Also reports using a netti pot with some relief.   Prior to Admission medications   Not on File     Allergies  Allergen Reactions  . Citric Acid Other (See Comments)    GI symptoms  . Fructose Other (See Comments)    Lower G.I. symptoms     Patient Active Problem List   Diagnosis Date Noted  . Uterine scar from previous cesarean delivery, antepartum condition or complication 09/23/2014   . Previous cesarean section complicating pregnancy 09/23/2014  . Postoperative state 09/23/2014  . IBS (irritable bowel syndrome) 04/03/2011     Family History  Problem Relation Age of Onset  . Cancer Mother     breast , ovarian   . Hyperlipidemia Mother   . Heart disease Mother     by pass surgery  . Breast cancer Mother 58  . Ovarian cancer Mother 60    metastatic to abd/omentum (diagnosed after finding pleural effusions)  . Hyperlipidemia Father   . Allergies Father   . Depression Father   . Cancer Maternal Grandmother 50    breast cancer  . Heart disease Maternal Grandfather   . Depression Paternal Grandmother   . Diabetes Neg Hx      Social History   Social History  . Marital Status: Married    Spouse Name: N/A  . Number of Children: 1  . Years of Education: N/A   Occupational History  . 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 EXECUTIVE DIRECTOR     Social History Main Topics  . Smoking status: Former Smoker -- 0.25 packs/day for 5 years    Quit date: 09/02/1997  . Smokeless tobacco: Never Used  . Alcohol Use: 2.4 oz/week    1 Cans of beer, 1 Shots of liquor, 2 Glasses of wine per week  .  Drug Use: No  . Sexual Activity: Yes    Birth Control/ Protection: None   Other Topics Concern  . Not on file   Social History Narrative   Married, 1 daughter [name], 1 son Dareen Pianonderson         Review of Systems Constitutional: Positive for fatigue. Negative for fever and chills.  HENT: Positive for congestion, ear discharge, ear pain, hearing loss, postnasal drip, rhinorrhea, sinus pressure, sore throat and voice change (hoarseness).  Eyes: Negative for pain, discharge, itching and visual disturbance.  Respiratory: Positive for cough. Negative for chest tightness, shortness of breath and wheezing.  Cardiovascular: Negative for chest pain.  Gastrointestinal: Negative for nausea, vomiting, abdominal pain and diarrhea.    Musculoskeletal: Positive for myalgias.  Neurological: Positive for dizziness. Negative for light-headedness and headaches.       Objective:  Physical Exam  Constitutional: She is oriented to person, place, and time. She appears well-developed and well-nourished. She is active and cooperative. No distress.  BP 130/86 mmHg  Pulse 83  Temp(Src) 98.4 F (36.9 C)  Resp 16  Ht 5\' 6"  (1.676 m)  Wt 124 lb (56.246 kg)  BMI 20.02 kg/m2  SpO2 99%  LMP 11/04/2015 (Approximate)  HENT:  Head: Normocephalic and atraumatic.  Right Ear: Hearing, tympanic membrane, external ear and ear canal normal.  Left Ear: Hearing normal. There is tenderness (tragus).  LEFT canal with mild erythema and edema, moist. TM is bulging and opaque, with scattered areas of bright red blood.  Eyes: Conjunctivae are normal. No scleral icterus.  Neck: Normal range of motion. Neck supple. No thyromegaly present.  Cardiovascular: Normal rate, regular rhythm and normal heart sounds.   Pulses:      Radial pulses are 2+ on the right side, and 2+ on the left side.  Pulmonary/Chest: Effort normal and breath sounds normal.  Lymphadenopathy:       Head (right side): No tonsillar, no preauricular, no posterior auricular and no occipital adenopathy present.       Head (left side): No tonsillar, no preauricular, no posterior auricular and no occipital adenopathy present.    She has no cervical adenopathy.       Right: No supraclavicular adenopathy present.       Left: No supraclavicular adenopathy present.  Neurological: She is alert and oriented to person, place, and time. No sensory deficit.  Skin: Skin is warm, dry and intact. No rash noted. No cyanosis or erythema. Nails show no clubbing.  Psychiatric: She has a normal mood and affect. Her speech is normal and behavior is normal.             Assessment & Plan:  1. Cough Likely due to drainage from viral URI. - chlorpheniramine-HYDROcodone (TUSSIONEX PENNKINETIC  ER) 10-8 MG/5ML SUER; Take 5 mLs by mouth every 12 (twelve) hours as needed for cough.  Dispense: 100 mL; Refill: 0  2. Acute suppurative otitis media of left ear without spontaneous rupture of tympanic membrane, recurrence not specified Counseled on ways to reduce discomfort with air travel. Anticipatory guidance provided. - amoxicillin-clavulanate (AUGMENTIN) 875-125 MG tablet; Take 1 tablet by mouth 2 (two) times daily.  Dispense: 20 tablet; Refill: 0 - ipratropium (ATROVENT) 0.03 % nasal spray; Place 2 sprays into both nostrils 2 (two) times daily.  Dispense: 30 mL; Refill: 0  3. Otitis, externa, infective, left Anticipatory guidance provided. - Ciprofloxacin HCl 0.2 % otic solution; Place 0.2 mLs into the left ear 2 (two) times daily.  Dispense: 14 vial;  Refill: 0   Fara Chute, PA-C Physician Assistant-Certified [facility]

## 2015-11-12 NOTE — Telephone Encounter (Signed)
Pt found what they were looking for and are ok.

## 2015-11-20 ENCOUNTER — Encounter: Payer: Self-pay | Admitting: Medical

## 2015-11-20 ENCOUNTER — Ambulatory Visit (INDEPENDENT_AMBULATORY_CARE_PROVIDER_SITE_OTHER): Payer: 59 | Admitting: Medical

## 2015-11-20 VITALS — BP 110/80 | HR 87 | Temp 98.8°F | Resp 16 | Wt 125.0 lb

## 2015-11-20 DIAGNOSIS — H6692 Otitis media, unspecified, left ear: Secondary | ICD-10-CM | POA: Diagnosis not present

## 2015-11-20 MED ORDER — LEVOFLOXACIN 500 MG PO TABS: 500.0000 mg | ORAL_TABLET | Freq: Every day | ORAL | Status: DC

## 2015-11-20 NOTE — Progress Notes (Signed)
Subjective: Chief Complaint  Patient presents with  . lt ear    stopped up. had a bad cold and had medicine but still cant hear from ear    Here for left ear pain and pressure.   She notes going to Urgent Care on 11/11/15 for painful left ear with some bloody drainage out left ear.   Was put on Augmentin and ear drops.  Overall much improved.   However, last week [place] down [place] trip, did some swimming, but no diving.   However, the pressure in the plane seemed to aggravate her ears.  So now she has stopped up feeling in left ear, can't hear well out of it.   No fever, no cough, no sore throat, no other c/o.    Past Medical History  Diagnosis Date  . Allergy     seasonal, plus cats, dust  . IBS (irritable bowel syndrome)   . AMA (advanced maternal age) multigravida 35+   . Anxiety in her early 20's    took Paxil  . GERD (gastroesophageal reflux disease)    ROS as in subjective   Objective: BP 110/80 mmHg  Pulse 87  Temp(Src) 98.8 F (37.1 C) (Tympanic)  Resp 16  Wt 125 lb (56.7 kg)  LMP 11/04/2015 (Approximate)  Breastfeeding? No  General appearance: alert, no distress, WD/WN HEENT: normocephalic, sclerae anicteric, left TM with what appears to be bloody effusion behind left TM posteriorly, but good light reflex, mild generalized erythema, but no erythema or drainage in canal, no pinna tenderness, nares patent, no discharge or erythema, pharynx normal Oral cavity: MMM, no lesions Neck: supple, no lymphadenopathy, no thyromegaly, no masses Lungs: CTA bilaterally, no wheezes, rhonchi, or rales    Assessment: Encounter Diagnosis  Name Primary?  . Acute left otitis media, recurrence not specified, unspecified otitis media type Yes     Plan: Reviewed recent urgent care visit notes.    Finish Augmentin, can use few more days of the ear drops from urgent care, add some sudafed OTC, good water intake, and if not improving in the next few days, can changes to  Levaquin.  F/u prn or if not completely resolved within a week.   Sabrina Peonrin was seen today for lt ear.  Diagnoses and all orders for this visit:  Acute left otitis media, recurrence not specified, unspecified otitis media type  Other orders -     levofloxacin (LEVAQUIN) 500 MG tablet; Take 1 tablet (500 mg total) by mouth daily.

## 2015-11-29 ENCOUNTER — Encounter: Payer: Self-pay | Admitting: Medical

## 2015-11-29 ENCOUNTER — Ambulatory Visit (INDEPENDENT_AMBULATORY_CARE_PROVIDER_SITE_OTHER): Payer: 59 | Admitting: Medical

## 2015-11-29 VITALS — BP 110/70 | HR 64 | Temp 98.1°F | Ht 66.0 in | Wt 125.0 lb

## 2015-11-29 DIAGNOSIS — H6692 Otitis media, unspecified, left ear: Secondary | ICD-10-CM

## 2015-11-29 MED ORDER — PREDNISONE 10 MG PO TABS: ORAL_TABLET | ORAL | Status: DC

## 2015-11-29 NOTE — Progress Notes (Signed)
Subjective: Chief Complaint  Patient presents with  . Follow-up    would like to know if her left ear is getting any better, still having some hearing issues with that ear-last abx this morning and is still taking sudafed at night.   Here for f/u on ear infection.  I saw her 11/20/15 for same.  At that point we added Levaquin but she had already been on 1 round of Augmentin from urgent care.   She notes going to Urgent Care on 11/11/15 for painful left ear with some bloody drainage out left ear.   Was put on Augmentin and ear drops.  last week she flew down to [place] for a trip, did some swimming, but no diving.   However, the pressure in the plane seemed to aggravate her ears.  So now she has stopped up feeling in left ear, can't hear well out of it.   No fever, no cough, no sore throat, no other c/o.    Past Medical History  Diagnosis Date  . Allergy     seasonal, plus cats, dust  . IBS (irritable bowel syndrome)   . AMA (advanced maternal age) multigravida 35+   . Anxiety in her early 20's    took Paxil  . GERD (gastroesophageal reflux disease)    ROS as in subjective   Objective: BP 110/70 mmHg  Pulse 64  Temp(Src) 98.1 F (36.7 C) (Tympanic)  Ht 5\' 6"  (1.676 m)  Wt 125 lb (56.7 kg)  BMI 20.19 kg/m2  LMP 11/04/2015 (Approximate)  Breastfeeding? No  General appearance: alert, no distress, WD/WN HEENT: normocephalic, sclerae anicteric, left TM without blood or erythema now.  Right TM pearly, but there is still clearish effusion behind left TM still. No pinna tenderness, nares patent, no discharge or erythema, pharynx normal Oral cavity: MMM, no lesions Neck: supple, no lymphadenopathy, no thyromegaly, no masses Lungs: CTA bilaterally, no wheezes, rhonchi, or rales    Assessment: Encounter Diagnosis  Name Primary?  . Acute left otitis media, recurrence not specified, unspecified otitis media type Yes     Plan: She has completed Augmentin and Levaquin back to back.  She  will use Flonase nasal at home the next few days, begin prednisone.  discussed risk/benefits of prednisone.   If not back to normal within 1-2 weeks, call back.  If no major improvement in hearing or ear fullness within 2 wk, may end up needing to see ENT.  Sabrina Chapman was seen today for follow-up.  Diagnoses and all orders for this visit:  Acute left otitis media, recurrence not specified, unspecified otitis media type  Other orders -     predniSONE (DELTASONE) 10 MG tablet; 6/5/4/3/2/1 taper

## 2016-01-10 ENCOUNTER — Encounter: Payer: Self-pay | Admitting: Family Medicine

## 2016-01-10 ENCOUNTER — Ambulatory Visit (INDEPENDENT_AMBULATORY_CARE_PROVIDER_SITE_OTHER): Payer: 59 | Admitting: Family Medicine

## 2016-01-10 ENCOUNTER — Telehealth: Payer: Self-pay | Admitting: Family Medicine

## 2016-01-10 VITALS — BP 118/84 | HR 92 | Ht 66.0 in | Wt 124.2 lb

## 2016-01-10 DIAGNOSIS — Z8249 Family history of ischemic heart disease and other diseases of the circulatory system: Secondary | ICD-10-CM

## 2016-01-10 DIAGNOSIS — R5383 Other fatigue: Secondary | ICD-10-CM | POA: Diagnosis not present

## 2016-01-10 DIAGNOSIS — R21 Rash and other nonspecific skin eruption: Secondary | ICD-10-CM | POA: Diagnosis not present

## 2016-01-10 DIAGNOSIS — R413 Other amnesia: Secondary | ICD-10-CM | POA: Diagnosis not present

## 2016-01-10 DIAGNOSIS — J309 Allergic rhinitis, unspecified: Secondary | ICD-10-CM | POA: Diagnosis not present

## 2016-01-10 DIAGNOSIS — Z0000 Encounter for general adult medical examination without abnormal findings: Secondary | ICD-10-CM

## 2016-01-10 LAB — LIPID PANEL
CHOL/HDL RATIO: 2.9 ratio (ref <=5.0)
CHOLESTEROL: 223 mg/dL — AB (ref 125–200)
HDL: 78 mg/dL (ref >=46)
LDL Cholesterol: 134 mg/dL — ABNORMAL HIGH (ref <130)
Triglycerides: 54 mg/dL (ref <150)
VLDL: 11 mg/dL (ref <30)

## 2016-01-10 LAB — COMPREHENSIVE METABOLIC PANEL
ALBUMIN: 4.4 g/dL (ref 3.6–5.1)
ALT: 13 U/L (ref 6–29)
AST: 17 U/L (ref 10–30)
Alkaline Phosphatase: 47 U/L (ref 33–115)
BUN: 11 mg/dL (ref 7–25)
CALCIUM: 9.2 mg/dL (ref 8.6–10.2)
CHLORIDE: 104 mmol/L (ref 98–110)
CO2: 27 mmol/L (ref 20–31)
CREATININE: 0.62 mg/dL (ref 0.50–1.10)
Glucose, Bld: 88 mg/dL (ref 65–99)
POTASSIUM: 4.9 mmol/L (ref 3.5–5.3)
Sodium: 138 mmol/L (ref 135–146)
TOTAL PROTEIN: 7.3 g/dL (ref 6.1–8.1)
Total Bilirubin: 1.1 mg/dL (ref 0.2–1.2)

## 2016-01-10 LAB — POCT URINALYSIS DIPSTICK
Bilirubin, UA: NEGATIVE
Blood, UA: NEGATIVE
Glucose, UA: NEGATIVE
Ketones, UA: NEGATIVE
LEUKOCYTES UA: NEGATIVE
NITRITE UA: NEGATIVE
PH UA: 6
PROTEIN UA: NEGATIVE
Spec Grav, UA: 1.025
Urobilinogen, UA: NEGATIVE

## 2016-01-10 LAB — TSH: TSH: 0.78 m[IU]/L

## 2016-01-10 LAB — CBC WITH DIFFERENTIAL/PLATELET
BASOS PCT: 0 %
Basophils Absolute: 0 cells/uL (ref 0–200)
Eosinophils Absolute: 86 cells/uL (ref 15–500)
Eosinophils Relative: 2 %
HEMATOCRIT: 38.3 % (ref 35.0–45.0)
Hemoglobin: 13 g/dL (ref 11.7–15.5)
LYMPHS ABS: 989 {cells}/uL (ref 850–3900)
LYMPHS PCT: 23 %
MCH: 31.9 pg (ref 27.0–33.0)
MCHC: 33.9 g/dL (ref 32.0–36.0)
MCV: 94.1 fL (ref 80.0–100.0)
MONO ABS: 516 {cells}/uL (ref 200–950)
MPV: 9 fL (ref 7.5–12.5)
Monocytes Relative: 12 %
NEUTROS ABS: 2709 {cells}/uL (ref 1500–7800)
Neutrophils Relative %: 63 %
Platelets: 246 10*3/uL (ref 140–400)
RBC: 4.07 MIL/uL (ref 3.80–5.10)
RDW: 14.2 % (ref 11.0–15.0)
WBC: 4.3 10*3/uL (ref 4.0–10.5)

## 2016-01-10 LAB — VITAMIN B12: VITAMIN B 12: 1369 pg/mL — AB (ref 200–1100)

## 2016-01-10 MED ORDER — TRIAMCINOLONE ACETONIDE 0.1 % EX CREA: TOPICAL_CREAM | CUTANEOUS | Status: DC

## 2016-01-10 NOTE — Progress Notes (Signed)
Chief Complaint  Patient presents with  . Annual Exam    nonfasting annual exam, no pap. Has been having issues with low energy and some memory issues x 1 year. No other concerns. Just has eye exam earlier this year ay [facility].    Sabrina Chapman is a 45 y.o. female who presents for a complete physical.  She has the following concerns:  Recently changed jobs to [employer]  She has felt "blah" over the whole last year. She thought changing jobs would improve her energy--she loves her job. She started taking B12 and thinks it may have helped some with her energy. Children are 1.5 and 3.5 yo, usually sleep fine, and she reports sleeping very well. Sometimes notices having a hard time remembering some basic words that she feels she should know. Others haven't noticed/mentioned.  Husband snores. Doesn't report that she snores.  Started writing in a journal so that she can remember what she did last week. Afraid of not remembering things.  Typical reaction to stress/anxiety is weight loss, diarrhea--not having any of that recently. She had monthly counseling until Feb/March through [facility], very helpful.  She has allergies that are flaring pretty badly currently. Hasn't taken anything for it yet this year.  She reports a dry spot to her right knee that has been there for a while.  Previously had prescription cream which may have helped, but never completely went away.  Requesting rx.  Immunization History  Administered Date(s) Administered  . Influenza Split 07/17/2012  . Influenza,inj,Quad PF,36+ Mos 09/15/2013, 07/05/2014  . PPD Test 11/25/2012  . Tdap 07/16/2012   Last Pap smear: 04/2015 Last mammogram: 04/2015 at Dr. [facility]ie [facility] Last colonoscopy: never   Last DEXA: never Dentist:  Twice yearly Ophtho: yearly (has contacts, rarely wears) Exercise: yoga weekly (power yoga) prior to changing jobs in 11/2015.  Past Medical History  Diagnosis Date  .  Allergy     seasonal, plus cats, dust  . IBS (irritable bowel syndrome)   . AMA (advanced maternal age) multigravida 35+   . Anxiety in her early 20's    took Paxil  . GERD (gastroesophageal reflux disease)     Past Surgical History  Procedure Laterality Date  . Sinus surgery  age 45    polyp removal  . Dilation and curettage of uterus  2008  . Cesarean section  07/15/2012    Procedure: CESAREAN SECTION;  Surgeon: Loney LaurenceMichelle A Horvath, MD;  Location: [facility];  Service: Obstetrics;  Laterality: N/A;  . Cesarean section N/A 09/23/2014    Procedure: CESAREAN SECTION;  Surgeon: Loney LaurenceMichelle A Horvath, MD;  Location: [facility];  Service: Obstetrics;  Laterality: N/A;  . Bilateral salpingectomy  09/23/14    done at time of c-section, per pt    Social History   Social History  . Marital Status: Married    Spouse Name: N/A  . Number of Children: 2  . Years of Education: N/A   Occupational History  . 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 EXECUTIVE DIRECTOR -[employer]    Social History Main Topics  . Smoking status: Former Smoker -- 0.25 packs/day for 5 years    Quit date: 09/02/1997  . Smokeless tobacco: Never Used  . Alcohol Use: 2.4 oz/week    2 Glasses of wine, 1 Cans of beer, 1 Shots of liquor per week     Comment: 1 glass of wine per day  . Drug Use:  No  . Sexual Activity:    Partners: Male    Birth Control/ Protection: Surgical   Other Topics Concern  . Not on file   Social History Narrative   Married, 1 daughter Lauris Poag, 1 son [name]     Family History  Problem Relation Age of Onset  . Cancer Mother     breast @62 , ovarian @65   . Hyperlipidemia Mother   . Heart disease Mother 68    by pass surgery  . Breast cancer Mother 53  . Ovarian cancer Mother 74    metastatic to abd/omentum (diagnosed after finding pleural effusions)  . Hyperlipidemia Father   . Allergies Father   . Depression Father   . Cancer Maternal Grandmother 50     breast cancer  . Heart disease Maternal Grandfather   . Depression Paternal Grandmother   . Diabetes Neg Hx     Outpatient Encounter Prescriptions as of 01/10/2016  Medication Sig  . vitamin B-12 (CYANOCOBALAMIN) 1000 MCG tablet Take 1,000 mcg by mouth daily.  . [DISCONTINUED] ipratropium (ATROVENT) 0.03 % nasal spray Place 2 sprays into both nostrils 2 (two) times daily. (Patient not taking: Reported on 11/29/2015)  . [DISCONTINUED] levofloxacin (LEVAQUIN) 500 MG tablet Take 1 tablet (500 mg total) by mouth daily.  . [DISCONTINUED] predniSONE (DELTASONE) 10 MG tablet 6/5/4/3/2/1 taper   No facility-administered encounter medications on file as of 01/10/2016.    Allergies  Allergen Reactions  . Citric Acid Other (See Comments)    GI symptoms  . Fructose Other (See Comments)    Lower G.I. symptoms    ROS: The patient denies anorexia, fever, weight changes, headaches,  vision changes, decreased hearing, ear pain, sore throat, breast concerns, chest pain, palpitations, dizziness, syncope, dyspnea on exertion, cough, swelling, nausea, vomiting, diarrhea, constipation, abdominal pain, melena, hematochezia, indigestion/heartburn, hematuria, incontinence, dysuria, irregular menstrual cycles, vaginal discharge, odor or itch, genital lesions, joint pains, numbness, tingling, weakness, tremor, suspicious skin lesions, depression, anxiety, abnormal bleeding/bruising, or enlarged lymph nodes.  Some hand tingling when laying in bed, holding a book. Itchy dry spot at her right knee, per HPI +fatigue, memory concerns as per HPI. +allergies   PHYSICAL EXAM:  BP 118/84 mmHg  Pulse 92  Ht 5\' 6"  (1.676 m)  Wt 124 lb 3.2 oz (56.337 kg)  BMI 20.06 kg/m2  LMP 12/21/2015  Breastfeeding? No  General Appearance:    Alert, cooperative, no distress, appears stated age  Head:    Normocephalic, without obvious abnormality, atraumatic  Eyes:    PERRL, conjunctiva/corneas clear, EOM's intact, fundi     benign  Ears:    Normal TM's and external ear canals  Nose:   Nares normal, mucosa is moderately edematous with clear mucus; no sinus tenderness  Throat:   Lips, mucosa, and tongue normal; teeth and gums normal  Neck:   Supple, no lymphadenopathy;  thyroid:  no   enlargement/tenderness/nodules; no carotid   bruit or JVD  Back:    Spine nontender, no curvature, ROM normal, no CVA     tenderness  Lungs:     Clear to auscultation bilaterally without wheezes, rales or     ronchi; respirations unlabored  Chest Wall:    No tenderness or deformity   Heart:    Regular rate and rhythm, S1 and S2 normal, no murmur, rub   or gallop  Breast Exam:    Deferred to GYN  Abdomen:     Soft, non-tender, nondistended, normoactive bowel sounds,    no  masses, no hepatosplenomegaly  Genitalia:    Deferred to GYN     Extremities:   No clubbing, cyanosis or edema  Pulses:   2+ and symmetric all extremities  Skin:   Skin color, texture, turgor normal; dry patch infero laterally to R knee.  Lymph nodes:   Cervical, supraclavicular, and axillary nodes normal  Neurologic:   CNII-XII intact, normal strength, sensation and gait; reflexes 2+ and symmetric throughout          Psych:   Normal mood, affect, hygiene and grooming.     ASSESSMENT/PLAN:  Annual physical exam - Plan: POCT Urinalysis Dipstick, Lipid panel, Comprehensive metabolic panel, CBC with Differential/Platelet, VITAMIN D 25 Hydroxy (Vit-D Deficiency, Fractures), TSH, Vitamin B12  Other fatigue - Plan: Comprehensive metabolic panel, CBC with Differential/Platelet, VITAMIN D 25 Hydroxy (Vit-D Deficiency, Fractures), TSH, Vitamin B12  Family history of heart disease in female family member before age 22 - Plan: Lipid panel  Memory difficulty - Plan: Vitamin B12  Rash and nonspecific skin eruption - suspect eczema.  Rx'd TAC 0.1% (see phone message--not given at visit)  Allergic rhinitis, unspecified allergic rhinitis type - suspect this contributes  to her fatigue; didn't like zyrtec--trial of claritin or allegra    Discussed monthly self breast exams and yearly mammograms; at least 30 minutes of aerobic activity at least 5 days/week, weight-bearing exercise 2-3x/wk; proper sunscreen use reviewed; healthy diet, including goals of calcium and vitamin D intake and alcohol recommendations (less than or equal to 1 drink/day) reviewed; regular seatbelt use; changing batteries in smoke detectors.  Immunization recommendations discussed--yearly flu shots recommended.  Colonoscopy recommendations reviewed, age 65.   Consider sleep study if fatigue persists without etiology.

## 2016-01-10 NOTE — Telephone Encounter (Signed)
Pt called and states that you looked at a spot on her leg that was eczema and you was going to prescribe her something for her at the end of the office visit she forget to remind you when she left. [name] - [place], [place] - [address] AT [facility] [address] and pt can be reached at [phone] (M)

## 2016-01-10 NOTE — Patient Instructions (Addendum)
  HEALTH MAINTENANCE RECOMMENDATIONS:  It is recommended that you get at least 30 minutes of aerobic exercise at least 5 days/week (for weight loss, you may need as much as 60-90 minutes). This can be any activity that gets your heart rate up. This can be divided in 10-15 minute intervals if needed, but try and build up your endurance at least once a week.  Weight bearing exercise is also recommended twice weekly.  Eat a healthy diet with lots of vegetables, fruits and fiber.  "Colorful" foods have a lot of vitamins (ie green vegetables, tomatoes, red peppers, etc).  Limit sweet tea, regular sodas and alcoholic beverages, all of which has a lot of calories and sugar.  Up to 1 alcoholic drink daily may be beneficial for women (unless trying to lose weight, watch sugars).  Drink a lot of water.  Calcium recommendations are 1200-1500 mg daily (1500 mg for postmenopausal women or women without ovaries), and vitamin D 1000 IU daily.  This should be obtained from diet and/or supplements (vitamins), and calcium should not be taken all at once, but in divided doses.  Monthly self breast exams and yearly mammograms for women over the age of 340 is recommended.  Sunscreen of at least SPF 30 should be used on all sun-exposed parts of the skin when outside between the hours of 10 am and 4 pm (not just when at beach or pool, but even with exercise, golf, tennis, and yard work!)  Use a sunscreen that says "broad spectrum" so it covers both UVA and UVB rays, and make sure to reapply every 1-2 hours.  Remember to change the batteries in your smoke detectors when changing your clock times in the spring and fall.  Use your seat belt every time you are in a car, and please drive safely and not be distracted with cell phones and texting while driving.   We will be in touch with your test results within the next 2 days.  Please start taking either Claritin or Allegra for your allergies, as this can contribute to  fatigue.

## 2016-01-11 ENCOUNTER — Other Ambulatory Visit: Payer: Self-pay | Admitting: *Deleted

## 2016-01-11 LAB — VITAMIN D 25 HYDROXY (VIT D DEFICIENCY, FRACTURES): Vit D, 25-Hydroxy: 24 ng/mL — ABNORMAL LOW (ref 30–100)

## 2016-01-11 MED ORDER — ERGOCALCIFEROL 1.25 MG (50000 UT) PO CAPS: 50000.0000 [IU] | ORAL_CAPSULE | ORAL | Status: DC

## 2016-02-02 ENCOUNTER — Telehealth: Payer: Self-pay

## 2016-02-02 DIAGNOSIS — M25579 Pain in unspecified ankle and joints of unspecified foot: Secondary | ICD-10-CM

## 2016-02-02 DIAGNOSIS — M21611 Bunion of right foot: Secondary | ICD-10-CM

## 2016-02-02 DIAGNOSIS — M21612 Bunion of left foot: Principal | ICD-10-CM

## 2016-02-02 NOTE — Telephone Encounter (Signed)
Ok for referral.  I recommend [facility], but see if she has preference

## 2016-02-02 NOTE — Telephone Encounter (Signed)
Pt called in requesting referral to podiatry for bunions, foot pain and cracked heels. She had her CPE with you on 01/10/2016 and forgot to ask you then. Pt can be reached at [phone]. Trixie Rude/RLB

## 2016-02-05 NOTE — Telephone Encounter (Signed)
Spoke with patient and she was totally fine with [facility] for a referral, so I went ahead and placed one.

## 2016-02-22 ENCOUNTER — Ambulatory Visit (INDEPENDENT_AMBULATORY_CARE_PROVIDER_SITE_OTHER): Payer: 59 | Admitting: Podiatry

## 2016-02-22 ENCOUNTER — Ambulatory Visit (INDEPENDENT_AMBULATORY_CARE_PROVIDER_SITE_OTHER): Payer: 59

## 2016-02-22 ENCOUNTER — Encounter: Payer: Self-pay | Admitting: Podiatry

## 2016-02-22 VITALS — BP 118/80 | HR 72 | Resp 16

## 2016-02-22 DIAGNOSIS — L309 Dermatitis, unspecified: Secondary | ICD-10-CM

## 2016-02-22 DIAGNOSIS — M21619 Bunion of unspecified foot: Secondary | ICD-10-CM | POA: Diagnosis not present

## 2016-02-22 DIAGNOSIS — M779 Enthesopathy, unspecified: Secondary | ICD-10-CM

## 2016-02-22 MED ORDER — TRIAMCINOLONE ACETONIDE 10 MG/ML IJ SUSP
10.0000 mg | Freq: Once | INTRAMUSCULAR | Status: AC
Start: 2016-02-22 — End: 2016-02-22
  Administered 2016-02-22: 10 mg

## 2016-02-22 NOTE — Progress Notes (Signed)
   Subjective:    Patient ID: Sabrina Chapman, female    DOB: 08/17/1971, 45 y.o.   MRN: [id number]  HPI    Review of Systems  All other systems reviewed and are negative.      Objective:   Physical Exam        Assessment & Plan:

## 2016-02-22 NOTE — Patient Instructions (Signed)

## 2016-02-22 NOTE — Progress Notes (Signed)
Subjective:     Patient ID: Sabrina Chapman, female   DOB: 05/26/1971, 45 y.o.   MRN: [id number]  HPI patient states that she has a painful bunion right over left foot of at least 6 months duration with several year history of bunions. She's tried wider shoes soaks without relief and also complains of cracked heels   Review of Systems  All other systems reviewed and are negative.      Objective:   Physical Exam  Constitutional: She is oriented to person, place, and time.  Cardiovascular: Intact distal pulses.   Musculoskeletal: Normal range of motion.  Neurological: She is oriented to person, place, and time.  Skin: Skin is warm.  Nursing note and vitals reviewed.  neurovascular status intact muscle strength adequate range of motion within normal limits with patient found to have hyperostosis medial aspect first metatarsal head right with redness and pain oh sleeping in the plantar side of the metatarsal itself. Patient has cracked heels bilateral and structural deformity of the left foot which is not as painful as the right foot. 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 I went ahead and on the right I did inject around the capsule 3 mg Kenalog 5 mill grams Xylocaine to reduce inflammation and I discussed the possibility long-term for structural bunion correction. Patient will be seen back in 3 months or earlier if needed to discuss the results and to see whether or not surgery will be of benefit to her. I also went ahead today and I reviewed the plantar heel and gave advice on utilization of high percentage urea cream    which we have and Vaseline  X-ray report indicated that there is structural bunion deformity right over left with elevation of the angle  between the first and second metatarsal of approximately 15 on the right and 16 on the left

## 2016-03-06 ENCOUNTER — Encounter: Payer: Self-pay | Admitting: Family Medicine

## 2016-03-06 ENCOUNTER — Ambulatory Visit (INDEPENDENT_AMBULATORY_CARE_PROVIDER_SITE_OTHER): Payer: 59 | Admitting: Family Medicine

## 2016-03-06 VITALS — BP 102/72 | HR 80 | Ht 66.0 in | Wt 125.2 lb

## 2016-03-06 DIAGNOSIS — K625 Hemorrhage of anus and rectum: Secondary | ICD-10-CM | POA: Diagnosis not present

## 2016-03-06 DIAGNOSIS — K648 Other hemorrhoids: Secondary | ICD-10-CM | POA: Diagnosis not present

## 2016-03-06 DIAGNOSIS — R109 Unspecified abdominal pain: Secondary | ICD-10-CM

## 2016-03-06 DIAGNOSIS — R1013 Epigastric pain: Secondary | ICD-10-CM

## 2016-03-06 DIAGNOSIS — K219 Gastro-esophageal reflux disease without esophagitis: Secondary | ICD-10-CM | POA: Diagnosis not present

## 2016-03-06 LAB — POCT URINALYSIS DIPSTICK
BILIRUBIN UA: NEGATIVE
GLUCOSE UA: NEGATIVE
Ketones, UA: NEGATIVE
Leukocytes, UA: NEGATIVE
NITRITE UA: NEGATIVE
PH UA: 6
Protein, UA: NEGATIVE
RBC UA: NEGATIVE
SPEC GRAV UA: 1.025
Urobilinogen, UA: NEGATIVE

## 2016-03-06 NOTE — Progress Notes (Signed)
Chief Complaint  Patient presents with  . Blood In Stools    has had some IBS over the years and reacts to fructose and citric acid and thinks she had some. Has had some stomach issues over the last two weeks. Blood in stolols over the last week and this worries her, does not think she has a hemorrhoid. Did have an rx for Librax years ago and this helped. Blood is bright red in color and is light, not too heavy. Blood is with each BM over the last week .     2 weeks ago she started having pain in epigastrium, burning pain, similar to when diagnosed with hiatal hernia in the past. Also having some discomfort across the entire lower abdomen, crampy, similar to IBS symptoms in the past.  IBS has been under good control since avoiding fructose and citric acid. No known changes to her diet recently.  She may have had a cocktail with lime juice that she wasn't aware of, and felt bad the next day (abdominal pain).  Last week she started noticed blood in the stool. Bowels are otherwise normal--not hard, not loose, no straining.  She is seeing BRB on the toilet paper.  The amount of bleeding has decreased over the last 2 days, just a small amount on the toilet paper now.  Seeing blood after every bowel movement.  Has 1 bowel movement/day. Denies rectal pain.  She had epigastric pain daily--she cut back on coffee., and today it feels better. Worse after eating. More coffee than usual (increased from 6 oz to 2 cups--cut back yesterday), some more tomatoes.  She started taking pepto bismol about 10 days ago, took for a few days at the beginning, and over the weekend (just prn).  It helps very quickly.  PMH, PSH, SH and FH reviewed  Outpatient Encounter Prescriptions as of 03/06/2016  Medication Sig  . ergocalciferol (VITAMIN D2) 50000 units capsule Take 1 capsule (50,000 Units total) by mouth once a week.  . triamcinolone cream (KENALOG) 0.1 % Apply sparingly to affected area of skin twice daily for up to  2 weeks prn rash (Patient not taking: Reported on 02/22/2016)  . [DISCONTINUED] vitamin B-12 (CYANOCOBALAMIN) 1000 MCG tablet Take 1,000 mcg by mouth daily. Reported on 02/22/2016   No facility-administered encounter medications on file as of 03/06/2016.   Allergies  Allergen Reactions  . Citric Acid Other (See Comments)    GI symptoms  . Fructose Other (See Comments)    Lower G.I. symptoms   ROS: no fever, chills, nausea, vomiting, melena, urinary complaints, vaginal discharge, URI symptoms, headaches, dizziness, chest pain, shortness of breath, bleeding, bruising, rash, depression or other concerns.  +rectal bleeding and epigastric pain as per HPI.  PHYSICAL EXAM:  BP 102/72 mmHg  Pulse 80  Ht 5\' 6"  (1.676 m)  Wt 125 lb 3.2 oz (56.79 kg)  BMI 20.22 kg/m2  LMP 02/27/2016  Breastfeeding? No Well developed, pleasant female in no distress HEENT: PERRL, EOMI, conjunctiva and sclera are clear, OP clear Neck: no lymphadenopathy, thyromegaly or mass Heart: regular rate and rhythm Lungs: clear bilaterally Back: no CVA tenderness Abdomen: soft, nontender, no organomegaly or mass, active bowel sounds Rectal: no visible bleeding.  No anal fissure or external hemorrhoids noted. Anoscopy performed, which revealed Internal hemorrhoids--not actively bleeding, but are inflamed  Urine dip normal  ASSESSMENT/PLAN:  Internal hemorrhoid, bleeding - recommended anusol HC suppositories, OTC. discussed high fiber diet, stool softeners, adequate fluid intake  Abdominal pain, unspecified  abdominal location - Plan: POCT Urinalysis Dipstick  Rectal bleeding - improving/diminished - Plan: PR DIAGNOSTIC ANOSCOPY  Abdominal pain, epigastric - resolved  Gastroesophageal reflux disease without esophagitis - diet/precautions reviewed in detail   F/u if persistent bleeding. Return if fever, melena, abdominal pain, or other concerns.  Counseled re: reflux, rectal bleeding DDx All questions answered

## 2016-03-06 NOTE — Patient Instructions (Signed)
Try using Anusol HC suppositories for the internal hemorrhoids.  Hemorrhoids Hemorrhoids are swollen veins around the rectum or anus. There are two types of hemorrhoids:   Internal hemorrhoids. These occur in the veins just inside the rectum. They may poke through to the outside and become irritated and painful.  External hemorrhoids. These occur in the veins outside the anus and can be felt as a painful swelling or hard lump near the anus. CAUSES  Pregnancy.   Obesity.   Constipation or diarrhea.   Straining to have a bowel movement.   Sitting for long periods on the toilet.  Heavy lifting or other activity that caused you to strain.  Anal intercourse. SYMPTOMS   Pain.   Anal itching or irritation.   Rectal bleeding.   Fecal leakage.   Anal swelling.   One or more lumps around the anus.  DIAGNOSIS  Your caregiver may be able to diagnose hemorrhoids by visual examination. Other examinations or tests that may be performed include:   Examination of the rectal area with a gloved hand (digital rectal exam).   Examination of anal canal using a small tube (scope).   A blood test if you have lost a significant amount of blood.  A test to look inside the colon (sigmoidoscopy or colonoscopy). TREATMENT Most hemorrhoids can be treated at home. However, if symptoms do not seem to be getting better or if you have a lot of rectal bleeding, your caregiver may perform a procedure to help make the hemorrhoids get smaller or remove them completely. Possible treatments include:   Placing a rubber band at the base of the hemorrhoid to cut off the circulation (rubber band ligation).   Injecting a chemical to shrink the hemorrhoid (sclerotherapy).   Using a tool to burn the hemorrhoid (infrared light therapy).   Surgically removing the hemorrhoid (hemorrhoidectomy).   Stapling the hemorrhoid to block blood flow to the tissue (hemorrhoid stapling).  HOME CARE  INSTRUCTIONS   Eat foods with fiber, such as whole grains, beans, nuts, fruits, and vegetables. Ask your doctor about taking products with added fiber in them (fibersupplements).  Increase fluid intake. Drink enough water and fluids to keep your urine clear or pale yellow.   Exercise regularly.   Go to the bathroom when you have the urge to have a bowel movement. Do not wait.   Avoid straining to have bowel movements.   Keep the anal area dry and clean. Use wet toilet paper or moist towelettes after a bowel movement.   Medicated creams and suppositories may be used or applied as directed.   Only take over-the-counter or prescription medicines as directed by your caregiver.   Take warm sitz baths for 15-20 minutes, 3-4 times a day to ease pain and discomfort.   Place ice packs on the hemorrhoids if they are tender and swollen. Using ice packs between sitz baths may be helpful.   Put ice in a plastic bag.   Place a towel between your skin and the bag.   Leave the ice on for 15-20 minutes, 3-4 times a day.   Do not use a donut-shaped pillow or sit on the toilet for long periods. This increases blood pooling and pain.  SEEK MEDICAL CARE IF:  You have increasing pain and swelling that is not controlled by treatment or medicine.  You have uncontrolled bleeding.  You have difficulty or you are unable to have a bowel movement.  You have pain or inflammation outside the area  of the hemorrhoids. MAKE SURE YOU:  Understand these instructions.  Will watch your condition.  Will get help right away if you are not doing well or get worse.   This information is not intended to replace advice given to you by your health care provider. Make sure you discuss any questions you have with your health care provider.   Document Released: 08/16/2000 Document Revised: 08/05/2012 Document Reviewed: 06/23/2012 Elsevier Interactive Patient Education 2016 [employer]   Food  Choices for Gastroesophageal Reflux Disease, Adult When you have gastroesophageal reflux disease (GERD), the foods you eat and your eating habits are very important. Choosing the right foods can help ease the discomfort of GERD. WHAT GENERAL GUIDELINES DO I NEED TO FOLLOW?  Choose fruits, vegetables, whole grains, low-fat dairy products, and low-fat meat, fish, and poultry.  Limit fats such as oils, salad dressings, butter, nuts, and avocado.  Keep a food diary to identify foods that cause symptoms.  Avoid foods that cause reflux. These may be different for different people.  Eat frequent small meals instead of three large meals each day.  Eat your meals slowly, in a relaxed setting.  Limit fried foods.  Cook foods using methods other than frying.  Avoid drinking alcohol.  Avoid drinking large amounts of liquids with your meals.  Avoid bending over or lying down until 2-3 hours after eating. WHAT FOODS ARE NOT RECOMMENDED? The following are some foods and drinks that may worsen your symptoms: Vegetables Tomatoes. Tomato juice. Tomato and spaghetti sauce. Chili peppers. Onion and garlic. Horseradish. Fruits Oranges, grapefruit, and lemon (fruit and juice). Meats High-fat meats, fish, and poultry. This includes hot dogs, ribs, ham, sausage, salami, and bacon. Dairy Whole milk and chocolate milk. Sour cream. Cream. Butter. Ice cream. Cream cheese.  Beverages Coffee and tea, with or without caffeine. Carbonated beverages or energy drinks. Condiments Hot sauce. Barbecue sauce.  Sweets/Desserts Chocolate and cocoa. Donuts. Peppermint and spearmint. Fats and Oils High-fat foods, including [place] fries and potato chips. Other Vinegar. Strong spices, such as black pepper, white pepper, red pepper, cayenne, curry powder, cloves, ginger, and chili powder. The items listed above may not be a complete list of foods and beverages to avoid. Contact your dietitian for more information.    This information is not intended to replace advice given to you by your health care provider. Make sure you discuss any questions you have with your health care provider.   Document Released: 08/19/2005 Document Revised: 09/09/2014 Document Reviewed: 06/23/2013 Elsevier Interactive Patient Education [employer] Elsevier Inc.   Return if ongoing rectal bleeding, abdominal pain, or other concerns.

## 2016-04-08 ENCOUNTER — Telehealth: Payer: Self-pay | Admitting: Family Medicine

## 2016-04-08 NOTE — Telephone Encounter (Signed)
Pt needs referral to dermatologist for adult acne that's getting worse, said whomever you recommend will be fine

## 2016-04-08 NOTE — Telephone Encounter (Signed)
We have never addressed this concern at a visit.  I'm happy to see her for this when I return.   If she doesn't require a referral, just asking for a recommendation, then I recommend Dr. Elmon ElseKaren Gould, or [facility].  There are likely long waits, so I'm happy to see her to address this

## 2016-04-09 NOTE — Telephone Encounter (Signed)
Called pt & informed and she set up appt with Dr. Lynelle DoctorKnapp

## 2016-04-22 ENCOUNTER — Ambulatory Visit: Payer: 59 | Admitting: Family Medicine

## 2016-05-02 ENCOUNTER — Ambulatory Visit (INDEPENDENT_AMBULATORY_CARE_PROVIDER_SITE_OTHER): Payer: 59 | Admitting: Family Medicine

## 2016-05-02 ENCOUNTER — Encounter: Payer: Self-pay | Admitting: Family Medicine

## 2016-05-02 VITALS — BP 104/60 | HR 64 | Ht 66.0 in | Wt 125.0 lb

## 2016-05-02 DIAGNOSIS — L700 Acne vulgaris: Secondary | ICD-10-CM

## 2016-05-02 DIAGNOSIS — Z23 Encounter for immunization: Secondary | ICD-10-CM

## 2016-05-02 MED ORDER — TRETINOIN 0.025 % EX CREA: TOPICAL_CREAM | Freq: Every day | CUTANEOUS | 11 refills | Status: DC

## 2016-05-02 NOTE — Progress Notes (Signed)
Chief Complaint  Patient presents with  . Acne    on face and neck for many years, was on Retin A in years past-has worsened over the past 6 months and would like to start treatment.   Patient presents to discuss worsening acne on her neck and face. She gets whiteheads, but some deeper skin involvement on her posterior neck.  Years ago she had acne on chest, back and face. Years ago she used Retin-A.  She also took oral antibiotics at that time.  As we discussed the various treatments, it actually sounds like she did a full course of Accutane. This was over 10 years ago.  Currently having problems on the sides of her neck and face. Acne recurred during her pregnancies and since having children.  She has used benzoyl peroxide which helps some, but not with the deeper lesions on the neck. Mainly is using spot treatment, not prevention.  She just finished her menstrual cycle, flare is resolving now.  Tends to be worse with her cycles.  PMH, PSH, SH reviewed  Current Outpatient Prescriptions on File Prior to Visit  Medication Sig Dispense Refill  . triamcinolone cream (KENALOG) 0.1 % Apply sparingly to affected area of skin twice daily for up to 2 weeks prn rash (Patient not taking: Reported on 02/22/2016) 45 g 0   No current facility-administered medications on file prior to visit.    Allergies  Allergen Reactions  . Citric Acid Other (See Comments)    GI symptoms  . Fructose Other (See Comments)    Lower G.I. symptoms     ROS: Denies other skin lesions, rashes. Creams for the knee and thumb has been working.  No fever, chills, URI symptoms, dizziness, headaches, chest pain or other concerns.  PHYSICAL EXAM: BP 104/60 (BP Location: Left Arm, Patient Position: Sitting, Cuff Size: Normal)   Pulse 64   Ht 5\' 6"  (1.676 m)   Wt 125 lb (56.7 kg)   BMI 20.18 kg/m   Well developed, pleasant, well-appearing female in no distress. Short haircut (and very short posteriorly). Skin: Forehead  has many small whiteheads.  Some blackheads on the nose. Posterior neck, and lateral neck has some papules, inflammatory but healing, slightly scabbed. No deep cysts. Neck: no lymphadenopathy   ASSESSMENT/PLAN:  Acne vulgaris - Plan: tretinoin (RETIN-A) 0.025 % cream  Need for prophylactic vaccination and inoculation against influenza - Plan: Flu Vaccine QUAD 36+ mos PF IM (Fluarix & Fluzone Quad PF)    Apply the retin-A cream to face and neck at bedtime. Wash in the morning with a benzoyl peroxide product (wash, pad)--use every morning to same area, as prevention (not spot treatment).  Expect some drying; keep skin moisturized. If not improving after 2-3 months, choices include increasing the retin-A strength (if overall doing better, but not adequately), or if having more inflammatory papules (red bumps), then call to have a topical antibiotic added (we can do a combination of the antibiotic with the benzoyl peroxide to simplify the regimen).  Flu shot given

## 2016-05-02 NOTE — Patient Instructions (Signed)
Apply the retin-A cream to face and neck at bedtime. Wash in the morning with a benzoyl peroxide product (wash, pad)--use every morning to same area, as prevention (not spot treatment).  Expect some drying; keep skin moisturized. If not improving after 2-3 months, choices include increasing the retin-A strength (if overall doing better, but not adequately), or if having more inflammatory papules (red bumps), then call to have a topical antibiotic added (we can do a combination of the antibiotic with the benzoyl peroxide to simplify the regimen).    Acne Acne is a skin problem that causes pimples. Acne occurs when the pores in the skin get blocked. The pores may become infected with bacteria, or they may become red, sore, and swollen. Acne is a common skin problem, especially for teenagers. Acne usually goes away over time. CAUSES Each pore contains an oil gland. Oil glands make an oily substance that is called sebum. Acne happens when these glands get plugged with sebum, dead skin cells, and dirt. Then, the bacteria that are normally found in the oil glands multiply and cause inflammation. Acne is commonly triggered by changes in your hormones. These hormonal changes can cause the oil glands to get bigger and to make more sebum. Factors that can make acne worse include:  Hormone changes during:  Adolescence.  Women's menstrual cycles.  Pregnancy.  Oil-based cosmetics and hair products.  Harshly scrubbing the skin.  Strong soaps.  Stress.  Hormone problems that are due to certain diseases.  Long or oily hair rubbing against the skin.  Certain medicines.  Pressure from headbands, backpacks, or shoulder pads.  Exposure to certain oils and chemicals. RISK FACTORS This condition is more likely to develop in:  Teenagers.  People who have a family history of acne. SYMPTOMS Acne often occurs on the face, neck, chest, and upper back. Symptoms include:  Small, red bumps  (pimples or papules).  Whiteheads.  Blackheads.  Small, pus-filled pimples (pustules).  Big, red pimples or pustules that feel tender. More severe acne can cause:  An infected area that contains a collection of pus (abscess).  Hard, painful, fluid-filled sacs (cysts).  Scars. DIAGNOSIS This condition is diagnosed with a medical history and physical exam. Blood tests may also be done. TREATMENT Treatment for this condition can vary depending on the severity of your acne. Treatment may include:  Creams and lotions that prevent oil glands from clogging.  Creams and lotions that treat or prevent infections and inflammation.  Antibiotic medicines that are applied to the skin or taken as a pill.  Pills that decrease sebum production.  Birth control pills.  Light or laser treatments.  Surgery.  Injections of medicine into the affected areas.  Chemicals that cause peeling of the skin. Your health care provider will also recommend the best way to take care of your skin. Good skin care is the most important part of treatment. HOME CARE INSTRUCTIONS Skin Care Take care of your skin as told by your health care provider. You may be told to do these things:  Wash your skin gently at least two times each day, as well as:  After you exercise.  Before you go to bed.  Use mild soap.  Apply a water-based skin moisturizer after you wash your skin.  Use a sunscreen or sunblock with SPF 30 or greater. This is especially important if you are using acne medicines.  Choose cosmetics that will not plug your oil glands (are noncomedogenic). Medicines  Take over-the-counter and  prescription medicines only as told by your health care provider.  If you were prescribed an antibiotic medicine, apply or take it as told by your health care provider. Do not stop taking the antibiotic even if your condition improves. General Instructions  Keep your hair clean and off of your face. If you  have oily hair, shampoo your hair regularly or daily.  Avoid leaning your chin or forehead against your hands.  Avoid wearing tight headbands or hats.  Avoid picking or squeezing your pimples. That can make your acne worse and cause scarring.  Keep all follow-up visits as told by your health care provider. This is important.  Shave gently and only when necessary.  Keep a food journal to figure out if any foods are linked with your acne. SEEK MEDICAL CARE IF:  Your acne is not better after eight weeks.  Your acne gets worse.  You have a large area of skin that is red or tender.  You think that you are having side effects from any acne medicine.   This information is not intended to replace advice given to you by your health care provider. Make sure you discuss any questions you have with your health care provider.   Document Released: 08/16/2000 Document Revised: 05/10/2015 Document Reviewed: 10/26/2014 Elsevier Interactive Patient Education [employer]

## 2016-05-04 ENCOUNTER — Telehealth: Payer: Self-pay | Admitting: Family Medicine

## 2016-05-04 NOTE — Telephone Encounter (Signed)
P.A. TRETINOIN 

## 2016-05-07 NOTE — Telephone Encounter (Signed)
P.A. Tretinoin denied, this is plan exclusion.  Called pt & informed, she will call Member services and see if there is anything else that is covered or any other options.

## 2016-05-09 NOTE — Telephone Encounter (Signed)
Recv'd call back from [facility] stating that this is not plan exclusion & asking us to resubmit P.A. Again.  This was resent

## 2016-05-17 NOTE — Telephone Encounter (Signed)
P.A. Approved til 05/09/17, left message for pt, faxed pharmacy

## 2016-05-23 ENCOUNTER — Ambulatory Visit: Payer: 59 | Admitting: Podiatry

## 2016-05-24 ENCOUNTER — Telehealth: Payer: Self-pay | Admitting: Family Medicine

## 2016-05-24 NOTE — Telephone Encounter (Signed)
Called [facility] t# [phone] for quantity limits exception for 45 gms of Tretinoin instead of 20gms, this was approved til 05/24/17 PA# [id number]. Left message for pt and called pharmacy

## 2016-09-09 DIAGNOSIS — N62 Hypertrophy of breast: Secondary | ICD-10-CM | POA: Insufficient documentation

## 2016-11-26 ENCOUNTER — Ambulatory Visit (INDEPENDENT_AMBULATORY_CARE_PROVIDER_SITE_OTHER): Payer: 59 | Admitting: Family Medicine

## 2016-11-26 ENCOUNTER — Encounter: Payer: Self-pay | Admitting: Family Medicine

## 2016-11-26 VITALS — BP 110/68 | HR 76 | Temp 97.7°F | Wt 125.8 lb

## 2016-11-26 DIAGNOSIS — R300 Dysuria: Secondary | ICD-10-CM | POA: Diagnosis not present

## 2016-11-26 DIAGNOSIS — N3001 Acute cystitis with hematuria: Secondary | ICD-10-CM | POA: Diagnosis not present

## 2016-11-26 LAB — POCT URINALYSIS DIPSTICK
Bilirubin, UA: NEGATIVE
Glucose, UA: NEGATIVE
Ketones, UA: NEGATIVE
NITRITE UA: POSITIVE
SPEC GRAV UA: 1.02 (ref 1.030–1.035)
Urobilinogen, UA: NEGATIVE (ref ?–2.0)
pH, UA: 7 (ref 5.0–8.0)

## 2016-11-26 MED ORDER — NITROFURANTOIN MONOHYD MACRO 100 MG PO CAPS: 100.0000 mg | ORAL_CAPSULE | Freq: Two times a day (BID) | ORAL | 0 refills | Status: DC

## 2016-11-26 NOTE — Patient Instructions (Signed)

## 2016-11-26 NOTE — Progress Notes (Signed)
Subjective: Chief Complaint  Patient presents with  . UTI    UTI for 2 days. symptoms, burning with urination, back pain     Sabrina Chapman is a 46 y.o. female who complains of possible urinary tract infection.  She has had symptoms for 2 days.  Symptoms include back pain and dysuria, urgency and voiding small amounts. Patient denies fever, stomach ache, vaginal discharge and N/V/D.  Last UTI was years ago.   Using nothing for current symptoms.    LMP: 2 weeks ago.  Contraception: fallopian tubes removed.    Patient does have recurrent UTI history in the distant past.  have a history of recurrent UTI. Patient does not have a history of pyelonephritis.  No other aggravating or relieving factors.  No other c/o.  Past Medical History:  Diagnosis Date  . Allergy    seasonal, plus cats, dust  . AMA (advanced maternal age) multigravida 35+   . Anxiety in her early 20's   took Paxil  . GERD (gastroesophageal reflux disease)   . IBS (irritable bowel syndrome)     ROS as in subjective  Reviewed allergies, medications, past medical, surgical, and social history.    Objective: Vitals:   11/26/16 1542  BP: 110/68  Pulse: 76  Temp: 97.7 F (36.5 C)    General appearance: alert, no distress, WD/WN, female Abdomen: +bs, soft, non tender, non distended, no masses, no hepatomegaly, no splenomegaly, no bruits Back: no CVA tenderness GU: declined     Laboratory:  Urine dipstick: spec grav 1.020, leu 3+, nit+, blood 2+.       Assessment: Acute cystitis with hematuria - Plan: nitrofurantoin, macrocrystal-monohydrate, (MACROBID) 100 MG capsule, POCT urinalysis dipstick  Burning with urination - Plan: Urinalysis Dipstick   Plan: Discussed symptoms, diagnosis, possible complications, and usual course of illness.  Begin nitrofurantoin. Appears to be an uncomplicated UTI.   Advised increased water intake, can use OTC Tylenol for pain.    Advised she may take AZO for no more  than 48 hours if needed.  She will return in 2-3 weeks for a repeat UA due to hematuria. Order is in.   Urine culture not sent.    Call or return if worse or not improving.

## 2016-12-12 ENCOUNTER — Ambulatory Visit (INDEPENDENT_AMBULATORY_CARE_PROVIDER_SITE_OTHER): Payer: 59

## 2016-12-12 ENCOUNTER — Ambulatory Visit (INDEPENDENT_AMBULATORY_CARE_PROVIDER_SITE_OTHER): Payer: 59 | Admitting: Podiatry

## 2016-12-12 ENCOUNTER — Encounter: Payer: Self-pay | Admitting: Podiatry

## 2016-12-12 DIAGNOSIS — M2010 Hallux valgus (acquired), unspecified foot: Secondary | ICD-10-CM | POA: Diagnosis not present

## 2016-12-12 DIAGNOSIS — M21619 Bunion of unspecified foot: Secondary | ICD-10-CM | POA: Diagnosis not present

## 2016-12-12 DIAGNOSIS — M779 Enthesopathy, unspecified: Secondary | ICD-10-CM | POA: Diagnosis not present

## 2016-12-12 MED ORDER — TRIAMCINOLONE ACETONIDE 10 MG/ML IJ SUSP
10.0000 mg | Freq: Once | INTRAMUSCULAR | Status: AC
  Administered 2016-12-12: 10 mg

## 2016-12-12 NOTE — Patient Instructions (Signed)
Bunionectomy A bunionectomy is a surgical procedure to remove a bunion. A bunion is a visible bump of bone on the inside of your foot where your big toe meets the rest of your foot. A bunion can develop when pressure turns this bone (first metatarsal) toward the other toes. Shoes that are too tight are the most common cause of bunions. Bunions can also be caused by diseases, such as arthritis and polio. You may need a bunionectomy if your bunion is very large and painful or it affects your ability to walk. Tell a health care provider about:  Any allergies you have.  All medicines you are taking, including vitamins, herbs, eye drops, creams, and over-the-counter medicines.  Any problems you or family members have had with anesthetic medicines.  Any blood disorders you have.  Any surgeries you have had.  Any medical conditions you have. What are the risks? Generally, this is a safe procedure. However, problems may occur, including:  Infection.  Pain.  Nerve damage.  Bleeding or blood clots.  Reactions to medicines.  Numbness, stiffness, or arthritis in your toe.  Foot problems that continue even after the procedure. What happens before the procedure?  Ask your health care provider about:  Changing or stopping your regular medicines. This is especially important if you are taking diabetes medicines or blood thinners.  Taking medicines such as aspirin and ibuprofen. These medicines can thin your blood. Do not take these medicines before your procedure if your health care provider instructs you not to.  Do not drink alcohol before the procedure as directed by your health care provider.  Do not use tobacco products, including cigarettes, chewing tobacco, or electronic cigarettes, before the procedure as directed by your health care provider. If you need help quitting, ask your health care provider.  Ask your health care provider what kind of medicine you will be given during  your procedure. A bunionectomy may be done using one of these:  A medicine that numbs the area (local anesthetic).  A medicine that makes you go to sleep (general anesthetic). If you will be given general anesthetic, do not eat or drink anything after midnight on the night before the procedure or as directed by your health care provider. What happens during the procedure?  An IV tube may be inserted into a vein.  You will be given local anesthetic or general anesthetic.  The surgeon will make a cut (incision) over the enlarged area at the first joint of the big toe. The surgeon will remove the bunion.  You may have more than one incision if any of the bones in your big toe need to be moved. A bone itself may need to be cut.  Sometimes the tissues around the big toe may also need to be cut then tightened or loosened to reposition the toe.  Screws or other hardware may be used to keep your foot in thecorrect position.  The incision will be closed with stitches (sutures) and covered with adhesive strips or another type of bandage (dressing). What happens after the procedure?  You may spend some time in a recovery area.  Your blood pressure, heart rate, breathing rate, and blood oxygen level will be monitored often until the medicines you were given have worn off. This information is not intended to replace advice given to you by your health care provider. Make sure you discuss any questions you have with your health care provider. Document Released: 08/02/2005 Document Revised: 01/25/2016 Document Reviewed: 04/06/2014   Elsevier Interactive Patient Education  2017 [employer]  

## 2016-12-15 NOTE — Progress Notes (Signed)
Subjective:     Patient ID: Sabrina Chapman, female   DOB: 1971/08/20, 46 y.o.   MRN: [id number]  HPI patient states that she no she needs to get her bunions fixed but she wants short-term relief and wants to do it later this year   Review of Systems     Objective:   Physical Exam Neurovascular status intact negative Homans sign was noted with patient found to have hyperostosis medial aspect first metatarsal head bilateral with inflammation and fluid buildup with redness and pain when palpated    Assessment:     Chronic structural bunion deformity bilateral that's not respond conservatively with inflammation around the first metatarsal head bilateral    Plan:     H&P and condition reviewed. Were in a try temporary injection treatment followed by long-term surgical intervention which she wants. Today I did prep each foot and I injected into the capsular area surrounding the first MPJ and around the lateral joint surface with 3 mg Kenalog 5 mg Xylocaine advised on wider shoes soaks and we will schedule her surgery for later in the year  X-ray report indicates that there is structural bunion deformity bilateral with elevation of the intermetatarsal angle of approximately 15-16

## 2016-12-17 ENCOUNTER — Other Ambulatory Visit: Payer: 59

## 2016-12-18 ENCOUNTER — Telehealth: Payer: Self-pay | Admitting: *Deleted

## 2016-12-18 NOTE — Telephone Encounter (Signed)
"  I saw Dr. Charlsie Merles about a week ago.  He told me to call you to set up my surgery."  Do you have a date in mind?  "Yes, I'd like to do it on May 29."  May 29 is available.  "You will need to schedule a consultation with Dr. Charlsie Merles to sign consent forms and get pre-operative instructions. I can transfer you to a scheduler if you would like.  "That will be fine."  I transferred patient to [facility] in scheduling.

## 2016-12-27 ENCOUNTER — Ambulatory Visit: Payer: 59 | Admitting: Podiatry

## 2017-01-08 ENCOUNTER — Ambulatory Visit (INDEPENDENT_AMBULATORY_CARE_PROVIDER_SITE_OTHER): Payer: 59 | Admitting: Podiatry

## 2017-01-08 ENCOUNTER — Encounter: Payer: Self-pay | Admitting: Podiatry

## 2017-01-08 DIAGNOSIS — M21619 Bunion of unspecified foot: Secondary | ICD-10-CM

## 2017-01-08 NOTE — Progress Notes (Signed)
Subjective:    Patient ID: [name], female   DOB: 46 y.o.   MRN: [id number]   HPI patient presents to go over consent form concerning surgery on her right foot. 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 I discussed possibly doing the second foot 4 weeks later which we will decide on and I did explain everything as listed and the fact recovery can take 6 months to one year. I also dispensed air fracture walker with all instructions on usage and patient is confirmed for her date and is encouraged to call with any questions prior to procedure after she signed consent form today

## 2017-01-08 NOTE — Patient Instructions (Signed)

## 2017-01-15 DIAGNOSIS — L700 Acne vulgaris: Secondary | ICD-10-CM | POA: Insufficient documentation

## 2017-01-15 DIAGNOSIS — E559 Vitamin D deficiency, unspecified: Secondary | ICD-10-CM | POA: Insufficient documentation

## 2017-01-15 NOTE — Progress Notes (Signed)
Chief Complaint  Patient presents with  . Annual Exam    nonfasting annual exam, no pap. Has developed a chronic vertigo that is temporary when she has a sinus issue, she has stopped using the netti pot (bulb)-the last time lasted for about a week and it was awful, just got over it yesterday and would like a suggestion.    Sabrina Chapman is a 46 y.o. female who presents for a complete physical.  She has the following concerns:  Allergies: Claritin works okay.  Overall is controlled.  She had a cold recently and vertigo flared.  When her sinuses flare, about every other time over the last 4 episodes, she gets associated vertigo.  It seems to be positional vertigo.  Had vertigo when laying on the left and with pushups.  She used meclizine which helped some (makes her sleepy). She has also tried exercises (for positional vertigo) that she saw on You Tube which was helpful.  Acne: Started on Retin-A in August for acne on face and neck. She had a lot of flaking/dryness, didn't use it consistently. She started doing Whole 30, cut out dairy, grains, sugar and skin cleared up. Not really using the Retin-A at all now.  With diet changes--she now is eating 10 eggs/week.  Energy has improved on this diet.  Vitamin D deficiency:  Level was low at 24 last year.  Treated with 12 weeks of prescription therapy.  She is currently taking a daily supplement (can't recall if 1000 IU or 5000 IU).  Seeing Dr. Charlsie Merles for bunions, scheduled for surgery in June on the right.    Immunization History  Administered Date(s) Administered  . Influenza Split 07/17/2012  . Influenza,inj,Quad PF,36+ Mos 09/15/2013, 07/05/2014, 05/02/2016  . PPD Test 11/25/2012  . Tdap 07/16/2012   Last Pap smear: 04/2015 per Dr. Henderson Cloud Last mammogram: yearly at Dr. Kittie Plater Last colonoscopy: never   Last DEXA: never Dentist:  Twice yearly Ophtho: yearly (has contacts, rarely wears) Exercise: Going to [employer] 2x/week  from January to April (elliptical for 30-40 min plus weights). Work has been busy since then. Lipids: last checked last year.  Significant dietary changes since then, eating about 10 eggs/week now. Lab Results  Component Value Date   CHOL 223 (H) 01/10/2016   HDL 78 01/10/2016   LDLCALC 134 (H) 01/10/2016   TRIG 54 01/10/2016   CHOLHDL 2.9 01/10/2016   Past Medical History:  Diagnosis Date  . Allergy    seasonal, plus cats, dust  . AMA (advanced maternal age) multigravida 35+   . Anxiety in her early 20's   took Paxil  . GERD (gastroesophageal reflux disease)   . IBS (irritable bowel syndrome)     Past Surgical History:  Procedure Laterality Date  . BILATERAL SALPINGECTOMY  09/23/14   done at time of c-section, per pt  . CESAREAN SECTION  07/15/2012   Procedure: CESAREAN SECTION;  Surgeon: Loney Laurence, MD;  Location: [facility];  Service: Obstetrics;  Laterality: N/A;  . CESAREAN SECTION N/A 09/23/2014   Procedure: CESAREAN SECTION;  Surgeon: Loney Laurence, MD;  Location: [facility];  Service: Obstetrics;  Laterality: N/A;  . DILATION AND CURETTAGE OF UTERUS  2008  . sinus surgery  age 46   polyp removal    Social History   Social History  . Marital status: Married    Spouse name: N/A  . Number of children: 2  . Years of education: N/A   Occupational History  .  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 Smoking status: Former Smoker    Packs/day: 0.25    Years: 5.00    Quit date: 09/02/1997  . Smokeless tobacco: Never Used  . Alcohol use Yes     Comment: 1 glass of wine per day  . Drug use: No  . Sexual activity: Yes    Partners: Male    Birth control/ protection: Surgical   Other Topics Concern  . Not on file   Social History Narrative   Married, 1 daughter [name], 1 son Dareen Pianonderson; 1 dog, 1 cat (kept shaven)    Family  History  Problem Relation Age of Onset  . Cancer Mother        breast @62 , ovarian @65   . Hyperlipidemia Mother   . Heart disease Mother [id number]       by pass surgery  . Breast cancer Mother [id number]  . Ovarian cancer Mother [id number]       metastatic to abd/omentum (diagnosed after finding pleural effusions)  . Hyperlipidemia Father   . Allergies Father   . Depression Father   . Cancer Maternal Grandmother 50       breast cancer  . Heart disease Maternal Grandfather   . Depression Paternal Grandmother   . Diabetes Neg Hx     Outpatient Encounter Prescriptions as of 01/16/2017  Medication Sig Note  . b complex vitamins tablet Take 1 tablet by mouth daily.   . cholecalciferol (VITAMIN D) 1000 units tablet Take 1,000 Units by mouth daily. 01/16/2017: Can't recall the dose, 1000 or 5000 IU  . loratadine (CLARITIN) 10 MG tablet Take 10 mg by mouth daily.   . fluticasone (FLONASE) 50 MCG/ACT nasal spray Place 2 sprays into both nostrils daily.   Marland Kitchen. tretinoin (RETIN-A) 0.025 % cream Apply topically at bedtime. (Patient not taking: Reported on 01/16/2017) 01/16/2017: No longer needing since diet improved, her skin improved   No facility-administered encounter medications on file as of 01/16/2017.     Allergies  Allergen Reactions  . Citric Acid Other (See Comments)    GI symptoms  . Fructose Other (See Comments)    Lower G.I. symptoms    ROS: The patient denies anorexia, fever, headaches,  vision changes, decreased hearing, ear pain, sore throat, breast concerns, chest pain, palpitations, syncope, dyspnea on exertion, cough, swelling, nausea, vomiting, diarrhea, constipation, abdominal pain, melena, hematochezia, indigestion/heartburn, hematuria, incontinence, dysuria, vaginal discharge, odor or itch, genital lesions, joint pains, numbness, tingling, weakness, tremor, suspicious skin lesions, depression, anxiety, abnormal bleeding/bruising, or enlarged lymph nodes. Vertigo per HPI, resolved +weight loss  with dietary changes (lost it quickly, and has maintained current weight. Irregular menses--sometimes early/late/skipped, usually normal. No hot flashes/night sweats.   PHYSICAL EXAM:  BP 108/66 (BP Location: Left Arm, Patient Position: Sitting, Cuff Size: Normal)   Pulse 68   Ht 5\' 6"  (1.676 m)   Wt 118 lb (53.5 kg)   LMP 01/03/2017 (Exact Date)   BMI 19.05 kg/m   Wt Readings from Last 3 Encounters:  01/16/17 118 lb (53.5 kg)  11/26/16 125 lb 12.8 oz (57.1 kg)  05/02/16 125 lb (56.7 kg)    General Appearance:    Alert, cooperative, no distress, appears stated age  Head:    Normocephalic, without obvious abnormality, atraumatic  Eyes:    PERRL, conjunctiva/corneas clear, EOM's intact, fundi benign  Ears:  Normal TM's and external ear canals  Nose:   Nares normal, mucosa is moderately edematous (R>L) with clear mucus; no sinus tenderness  Throat:   Lips, mucosa, and tongue normal; teeth and gums normal  Neck:   Supple, no lymphadenopathy;  thyroid:  no   enlargement/tenderness/nodules; no carotid bruit or JVD  Back:    Spine nontender, no curvature, ROM normal, no CVA  tenderness  Lungs:     Clear to auscultation bilaterally without wheezes, rales or  ronchi; respirations unlabored  Chest Wall:    No tenderness or deformity   Heart:    Regular rate and rhythm, S1 and S2 normal, no murmur, rub or gallop  Breast Exam:    Deferred to GYN  Abdomen:     Soft, non-tender, nondistended, normoactive bowel sounds, no masses, no hepatosplenomegaly  Genitalia:    Deferred to GYN     Extremities:   No clubbing, cyanosis or edema  Pulses:   2+ and symmetric all extremities  Skin:   Skin color, texture, turgor normal  Lymph nodes:   Cervical, supraclavicular, and axillary nodes normal  Neurologic:   CNII-XII intact, normal strength, sensation and gait; reflexes 2+ and symmetric throughout                                       Psych:   Normal mood, affect, hygiene and  grooming   ASSESSMENT/PLAN:  Annual physical exam - Plan: VITAMIN D 25 Hydroxy (Vit-D Deficiency, Fractures), Lipid panel, CBC with Differential/Platelet, TSH  Acne vulgaris - improved with dietary changes  Vitamin D deficiency - continue daily supplement - Plan: VITAMIN D 25 Hydroxy (Vit-D Deficiency, Fractures)  Hypercholesterolemia - borderline LDL in past; significant dietary changes (more eggs, but no dairy); recheck today - Plan: Lipid panel  Irregular menses - suspect peri-menopausal - Plan: CBC with Differential/Platelet, TSH  Allergic rhinitis, unspecified seasonality, unspecified trigger - start flonase--proper use reviewed - Plan: fluticasone (FLONASE) 50 MCG/ACT nasal spray  Vertigo - related to URI and allergy flares.  Sometimes has positional component. Discussed tx of allergies, meclizine prn, positional exercises if positional   Vit D, lipid (not fasting) CBC, TSH   Discussed monthly self breast exams and yearly mammograms; at least 30 minutes of aerobic activity at least 5 days/week, weight-bearing exercise 2-3x/wk; proper sunscreen use reviewed; healthy diet, including goals of calcium and vitamin D intake and alcohol recommendations (less than or equal to 1 drink/day) reviewed; regular seatbelt use; changing batteries in smoke detectors.  Immunization recommendations discussed--yearly flu shots recommended. Shingrix age 63. Colonoscopy recommendations reviewed, age 49.

## 2017-01-16 ENCOUNTER — Ambulatory Visit (INDEPENDENT_AMBULATORY_CARE_PROVIDER_SITE_OTHER): Payer: 59 | Admitting: Family Medicine

## 2017-01-16 ENCOUNTER — Encounter: Payer: Self-pay | Admitting: Family Medicine

## 2017-01-16 VITALS — BP 108/66 | HR 68 | Ht 66.0 in | Wt 118.0 lb

## 2017-01-16 DIAGNOSIS — J309 Allergic rhinitis, unspecified: Secondary | ICD-10-CM

## 2017-01-16 DIAGNOSIS — R42 Dizziness and giddiness: Secondary | ICD-10-CM

## 2017-01-16 DIAGNOSIS — E7800 Pure hypercholesterolemia, unspecified: Secondary | ICD-10-CM | POA: Diagnosis not present

## 2017-01-16 DIAGNOSIS — E559 Vitamin D deficiency, unspecified: Secondary | ICD-10-CM | POA: Diagnosis not present

## 2017-01-16 DIAGNOSIS — L700 Acne vulgaris: Secondary | ICD-10-CM

## 2017-01-16 DIAGNOSIS — Z0000 Encounter for general adult medical examination without abnormal findings: Secondary | ICD-10-CM | POA: Diagnosis not present

## 2017-01-16 DIAGNOSIS — N926 Irregular menstruation, unspecified: Secondary | ICD-10-CM

## 2017-01-16 MED ORDER — FLUTICASONE PROPIONATE 50 MCG/ACT NA SUSP: 2.0000 | Freq: Every day | NASAL | 11 refills | Status: DC

## 2017-01-16 NOTE — Patient Instructions (Addendum)
  HEALTH MAINTENANCE RECOMMENDATIONS:  It is recommended that you get at least 30 minutes of aerobic exercise at least 5 days/week (for weight loss, you may need as much as 60-90 minutes). This can be any activity that gets your heart rate up. This can be divided in 10-15 minute intervals if needed, but try and build up your endurance at least once a week.  Weight bearing exercise is also recommended twice weekly.  Eat a healthy diet with lots of vegetables, fruits and fiber.  "Colorful" foods have a lot of vitamins (ie green vegetables, tomatoes, red peppers, etc).  Limit sweet tea, regular sodas and alcoholic beverages, all of which has a lot of calories and sugar.  Up to 1 alcoholic drink daily may be beneficial for women (unless trying to lose weight, watch sugars).  Drink a lot of water.  Calcium recommendations are 1200-1500 mg daily (1500 mg for postmenopausal women or women without ovaries), and vitamin D 1000 IU daily.  This should be obtained from diet and/or supplements (vitamins), and calcium should not be taken all at once, but in divided doses.  Monthly self breast exams and yearly mammograms for women over the age of 45 is recommended.  Sunscreen of at least SPF 30 should be used on all sun-exposed parts of the skin when outside between the hours of 10 am and 4 pm (not just when at beach or pool, but even with exercise, golf, tennis, and yard work!)  Use a sunscreen that says "broad spectrum" so it covers both UVA and UVB rays, and make sure to reapply every 1-2 hours.  Remember to change the batteries in your smoke detectors when changing your clock times in the spring and fall.  Use your seat belt every time you are in a car, and please drive safely and not be distracted with cell phones and texting while driving.  Start the flonase--this will help with allergies/congestion and potentially mild vertigo related to allergies. If you truly have position vertigo, re-try the exercises or  contact us for referral for physical therapy.  Continue to use the meclizine as needed (use just 12.5mg  if causing sedation). When you have your sinuses flaring, you may also try using sudafed.

## 2017-01-23 ENCOUNTER — Other Ambulatory Visit: Payer: 59

## 2017-01-23 DIAGNOSIS — N926 Irregular menstruation, unspecified: Secondary | ICD-10-CM

## 2017-01-23 DIAGNOSIS — E559 Vitamin D deficiency, unspecified: Secondary | ICD-10-CM

## 2017-01-23 DIAGNOSIS — Z0000 Encounter for general adult medical examination without abnormal findings: Secondary | ICD-10-CM

## 2017-01-23 DIAGNOSIS — E7800 Pure hypercholesterolemia, unspecified: Secondary | ICD-10-CM

## 2017-01-23 LAB — CBC WITH DIFFERENTIAL/PLATELET
Basophils Absolute: 0 cells/uL (ref 0–200)
Basophils Relative: 0 %
EOS PCT: 3 %
Eosinophils Absolute: 99 cells/uL (ref 15–500)
HCT: 37.7 % (ref 35.0–45.0)
HEMOGLOBIN: 12.7 g/dL (ref 11.7–15.5)
Lymphocytes Relative: 29 %
Lymphs Abs: 957 cells/uL (ref 850–3900)
MCH: 32 pg (ref 27.0–33.0)
MCHC: 33.7 g/dL (ref 32.0–36.0)
MCV: 95 fL (ref 80.0–100.0)
MONOS PCT: 8 %
MPV: 9.3 fL (ref 7.5–12.5)
Monocytes Absolute: 264 cells/uL (ref 200–950)
NEUTROS ABS: 1980 {cells}/uL (ref 1500–7800)
Neutrophils Relative %: 60 %
PLATELETS: 245 10*3/uL (ref 140–400)
RBC: 3.97 MIL/uL (ref 3.80–5.10)
RDW: 14.4 % (ref 11.0–15.0)
WBC: 3.3 10*3/uL — AB (ref 4.0–10.5)

## 2017-01-23 LAB — TSH: TSH: 0.73 m[IU]/L

## 2017-01-23 LAB — LIPID PANEL
Cholesterol: 219 mg/dL — ABNORMAL HIGH (ref <200)
HDL: 83 mg/dL (ref >50)
LDL CALC: 128 mg/dL — AB (ref <100)
Total CHOL/HDL Ratio: 2.6 Ratio (ref <5.0)
Triglycerides: 40 mg/dL (ref <150)
VLDL: 8 mg/dL (ref <30)

## 2017-01-24 LAB — VITAMIN D 25 HYDROXY (VIT D DEFICIENCY, FRACTURES): Vit D, 25-Hydroxy: 85 ng/mL (ref 30–100)

## 2017-01-28 ENCOUNTER — Telehealth: Payer: Self-pay | Admitting: *Deleted

## 2017-01-28 ENCOUNTER — Encounter: Payer: Self-pay | Admitting: Podiatry

## 2017-01-28 DIAGNOSIS — M2011 Hallux valgus (acquired), right foot: Secondary | ICD-10-CM | POA: Diagnosis not present

## 2017-01-28 HISTORY — PX: BUNIONECTOMY: SHX129

## 2017-01-28 NOTE — Telephone Encounter (Addendum)
Pt states she is having some tingling, and wanted to know if it was the anesthesia or from having her foot elevated all day. I left a message informing pt that it was probably the anesthesia leaving the limb and to check to make sure her toes were warm, pink, to have the foot about 6 inches above the hip. I spoke with pt and she states her toes are warm and spoke with Shanda BumpsJessica because she didn't know when I would call back since it was close to 5pm. I told her the anesthesia was probably wearing off and to begin her pain medication. Pt states understanding.02/04/2017-Pt states she would like refill of the pain medication, has been trying with the Ibuprofen, but is not helping. Dr. Charlsie Merlesegal states refill Percocet 10/325mg  #20 one tablet every 4-6 hours prn foot pain. I informed pt the rx would have to be picked [place] in the [place]. Pt states understanding.

## 2017-01-31 NOTE — Progress Notes (Signed)
DOS 05.29.2018 Austin bunionectomy (cutting and moving bone) with pin fixation right.

## 2017-02-03 ENCOUNTER — Ambulatory Visit (INDEPENDENT_AMBULATORY_CARE_PROVIDER_SITE_OTHER): Payer: 59

## 2017-02-03 ENCOUNTER — Ambulatory Visit (INDEPENDENT_AMBULATORY_CARE_PROVIDER_SITE_OTHER): Payer: 59 | Admitting: Podiatry

## 2017-02-03 ENCOUNTER — Ambulatory Visit: Payer: 59

## 2017-02-03 DIAGNOSIS — Z9889 Other specified postprocedural states: Secondary | ICD-10-CM | POA: Diagnosis not present

## 2017-02-03 DIAGNOSIS — M2010 Hallux valgus (acquired), unspecified foot: Secondary | ICD-10-CM

## 2017-02-03 NOTE — Progress Notes (Signed)
Subjective:    Patient ID: Sabrina Chapman, female   DOB: 46 y.o.   MRN: [id number]   HPI patient states she's doing great with minimal discomfort    ROS      Objective:  Physical Exam neurovascular status intact negative Homans sign was noted with wound edges well coapted right with hallux in rectus position good range of motion and no crepitus within the joint surface.     Assessment:    Doing well post Austin osteotomy right     Plan:  Reviewed condition and x-ray and reapplied sterile dressing instructed on continued immobilization elevation and reappoint again in 3 weeks or earlier if necessary  X-ray indicates osteotomy is healing well with fixation in place good alignment joint congruence

## 2017-02-04 MED ORDER — OXYCODONE-ACETAMINOPHEN 10-325 MG PO TABS: ORAL_TABLET | ORAL | 0 refills | Status: DC

## 2017-02-11 ENCOUNTER — Encounter: Payer: Self-pay | Admitting: Podiatry

## 2017-02-11 ENCOUNTER — Ambulatory Visit (INDEPENDENT_AMBULATORY_CARE_PROVIDER_SITE_OTHER): Payer: 59

## 2017-02-11 ENCOUNTER — Ambulatory Visit (INDEPENDENT_AMBULATORY_CARE_PROVIDER_SITE_OTHER): Payer: 59 | Admitting: Podiatry

## 2017-02-11 VITALS — BP 142/90 | HR 69 | Temp 98.4°F

## 2017-02-11 DIAGNOSIS — L03119 Cellulitis of unspecified part of limb: Secondary | ICD-10-CM

## 2017-02-11 DIAGNOSIS — Z9889 Other specified postprocedural states: Secondary | ICD-10-CM

## 2017-02-11 DIAGNOSIS — M2011 Hallux valgus (acquired), right foot: Secondary | ICD-10-CM | POA: Diagnosis not present

## 2017-02-11 DIAGNOSIS — L02619 Cutaneous abscess of unspecified foot: Secondary | ICD-10-CM

## 2017-02-11 MED ORDER — DOXYCYCLINE HYCLATE 100 MG PO TABS: 100.0000 mg | ORAL_TABLET | Freq: Two times a day (BID) | ORAL | 0 refills | Status: DC

## 2017-02-11 NOTE — Progress Notes (Signed)
This patient presents the office with chief complaint of a red, swollen, surgical site on her right foot.  She had surgery approximately 2 weeks ago for the correction of her bunion big toe joint, right foot. She was seen by Dr. Paulla Dolly approximately one week ago and she was doing well .  X-rays had been taken and there was normal findings on the x-ray.  The capital fragment was found to be intact and the wires intact. She presents to the office today wearing a Cam Walker She presents the office today stating that the area has become extremely red and swollen on the top of her right foot at the far end  of her incision.  She says this discoloration has been present and worsening for the last 2 days she has iced her foot, which has helped a little his patient says that she has seen drainage and pus coming from the far and of the incision. Upon talking with this patient. She admitted to cutting both ends of her sutures at the incision site  since she felt they were aggravating her condition.  She also says she is experiencing pain on the bottom of the foot pointing to the area under the ball of the right foot  Physical findings:  Her neurovascular status was intact. The incision over the first metatarsal is healing with significant redness and swelling noted on the dorsum of the first met.  There is no evidence of any increased temperature, or streaking noted on the dorsum of her right foot.  No palpable pain noted at the surgical site and normal range of motion of the first MPJ is found .  Diagnosis  Abscess Cellulitis right foot.    ROV  examination of the dorsum of the first metatarsal the right foot does reveal significant inflammation noted at the distal aspect of the surgical incision right foot. There is no evidence of any pus or lymphangitis or increased temperature or pain noted. Radiographic x-rays were taken reveal good positioning of the capital fragment with the K wires intact.  There is an obvious  inflammation noted to her right foot. Since I was most concerned about the possibility of an infection, I proceeded to prescribe doxycycline. #21 twice a day 10 days.  I told her to continue to check her foot and if there is no improvement to return to the office come Thursday or Friday of this week.  Otherwise, she is to return to the office at the time recommended by Dr. Laurence Aly DPM

## 2017-02-24 ENCOUNTER — Ambulatory Visit (INDEPENDENT_AMBULATORY_CARE_PROVIDER_SITE_OTHER): Payer: 59 | Admitting: Podiatry

## 2017-02-24 ENCOUNTER — Ambulatory Visit (INDEPENDENT_AMBULATORY_CARE_PROVIDER_SITE_OTHER): Payer: 59

## 2017-02-24 DIAGNOSIS — Z9889 Other specified postprocedural states: Secondary | ICD-10-CM | POA: Diagnosis not present

## 2017-02-24 DIAGNOSIS — M2010 Hallux valgus (acquired), unspecified foot: Secondary | ICD-10-CM | POA: Diagnosis not present

## 2017-02-24 DIAGNOSIS — M21619 Bunion of unspecified foot: Secondary | ICD-10-CM

## 2017-02-24 DIAGNOSIS — R61 Generalized hyperhidrosis: Secondary | ICD-10-CM | POA: Insufficient documentation

## 2017-02-24 NOTE — Progress Notes (Signed)
Subjective:    Patient ID: [place] NationErin C [place], female   DOB: 46 y.o.   MRN: [id number]   HPI patient states she's doing well with her right foot but she's been up on a quite a bit as her husband had a stroke and she has had to be helping him and he is now in rehabilitation    ROS      Objective:  Physical Exam neurovascular status intact with moderate edema around the first MPJ right with wound edges that are now well coapted with history of possible localized dehiscence which is responded oral antibiotics. Range of motion is good with 30 dorsiflexion 20 plantar with no crepitus of the joint     Assessment:   Doing well overall with moderate increase in swelling which is probably attributed partially to heavy weightbearing      Plan:   H&P and x-ray reviewed. At this point I have recommended returning to the boot for stability and I do think there is a small crack line in the first osteotomy cut but I do believe it will heal fine and it may have been brought on by activity levels. I do think it will be stable at this position but I want to watch it closely  X-ray indicates there may be a small crack line in the osteotomy but I do think it's holding position well and we will just immobilize her and watch this again in 2 weeks

## 2017-03-10 ENCOUNTER — Ambulatory Visit (INDEPENDENT_AMBULATORY_CARE_PROVIDER_SITE_OTHER): Payer: 59

## 2017-03-10 ENCOUNTER — Ambulatory Visit (INDEPENDENT_AMBULATORY_CARE_PROVIDER_SITE_OTHER): Payer: 59 | Admitting: Podiatry

## 2017-03-10 DIAGNOSIS — M2010 Hallux valgus (acquired), unspecified foot: Secondary | ICD-10-CM

## 2017-03-10 DIAGNOSIS — Z9889 Other specified postprocedural states: Secondary | ICD-10-CM

## 2017-03-11 NOTE — Progress Notes (Signed)
Subjective:    Patient ID: Sabrina Chapman, female   DOB: 46 y.o.   MRN: [id number]   HPI patient states she's had a good couple weeks and has been wearing her boot full-time    ROS      Objective:  Physical Exam neurovascular status intact negative Homans sign was noted with wound edges well coapted right with significant reduction in the edema previous visit     Assessment:    Hairline fracture of the right first metatarsal head secondary to excessive activity that appears to be healing well     Plan:    X-rays condition reviewed and advised on continued immobilization for several more weeks. I explained this should heal uneventfully but I do want to keep a close eye on it we will check it again in 3 weeks or earlier if any issues should occur  X-rays indicate there is a small crack line extending into the joint where the apex of the osteotomy occurred and it does appear to be stable from previous visit

## 2017-04-07 ENCOUNTER — Ambulatory Visit (INDEPENDENT_AMBULATORY_CARE_PROVIDER_SITE_OTHER): Payer: Self-pay | Admitting: Podiatry

## 2017-04-07 ENCOUNTER — Ambulatory Visit (INDEPENDENT_AMBULATORY_CARE_PROVIDER_SITE_OTHER): Payer: 59

## 2017-04-07 DIAGNOSIS — M2010 Hallux valgus (acquired), unspecified foot: Secondary | ICD-10-CM

## 2017-04-07 DIAGNOSIS — Z9889 Other specified postprocedural states: Secondary | ICD-10-CM

## 2017-04-09 NOTE — Progress Notes (Signed)
Subjective:    Patient ID: Sabrina NationErin C Chapman, female   DOB: 46 y.o.   MRN: [id number]   HPI patient presents stating that she's doing very well with the surgery with minimal discomfort and states that her husband is doing better and she does not have to be on her foot to the same degree    ROS      Objective:  Physical Exam neurovascular status intact with mild edema around the first MPJ right with range of motion of approximate 25 dorsiflexion 20 plantar flexion with no current crepitus noted     Assessment:    Doing well post osteotomy first metatarsal right that unfortunately did have a slight crack in it but appears to be healing well at the current time clinically     Plan:   H&P x-rays reviewed and at this point I encouraged the importance of range of motion exercises and she may gradually return to soft shoe gear. No intense type activities no jumping on her foot or anything that can create trauma to the osteotomy site until it has completely healed  X-rays indicate there is a small crack line in the first metatarsal but it is localized and has not moved at all over the previous period of time and it does not appear to be a problem long-term even though we still have to keep a very close eye on this

## 2017-04-23 ENCOUNTER — Encounter: Payer: Self-pay | Admitting: Family Medicine

## 2017-04-23 ENCOUNTER — Ambulatory Visit (INDEPENDENT_AMBULATORY_CARE_PROVIDER_SITE_OTHER): Payer: 59 | Admitting: Family Medicine

## 2017-04-23 VITALS — BP 120/72 | HR 72 | Ht 66.0 in | Wt 117.2 lb

## 2017-04-23 DIAGNOSIS — F4322 Adjustment disorder with anxiety: Secondary | ICD-10-CM

## 2017-04-23 DIAGNOSIS — N951 Menopausal and female climacteric states: Secondary | ICD-10-CM

## 2017-04-23 DIAGNOSIS — F5102 Adjustment insomnia: Secondary | ICD-10-CM

## 2017-04-23 MED ORDER — ALPRAZOLAM 0.25 MG PO TABS: 0.2500 mg | ORAL_TABLET | Freq: Three times a day (TID) | ORAL | 0 refills | Status: DC | PRN
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
## 2017-04-23 NOTE — Patient Instructions (Signed)
  Use the alprazolam if needed for significant anxiety. Use with caution due to potential sedation. Try and work on developing relaxation techniques, stress reduction, take small breaks throughout the day for yourself. I agree with counseling. Try melatonin for sleep, as well as discussing with friends the potential beneficial essential oils (for menopausal symptoms, anxiety, sleep) Continue the [employer]. Consider discussing your irregular menses and hot flashes with Dr. Henderson Cloud.

## 2017-05-09 ENCOUNTER — Ambulatory Visit: Payer: 59 | Admitting: Podiatry

## 2017-07-28 ENCOUNTER — Ambulatory Visit: Payer: 59 | Admitting: Family Medicine

## 2017-07-28 ENCOUNTER — Encounter: Payer: Self-pay | Admitting: Family Medicine

## 2017-07-28 VITALS — BP 122/84 | HR 88 | Temp 100.0°F | Ht 66.0 in | Wt 125.0 lb

## 2017-07-28 DIAGNOSIS — J069 Acute upper respiratory infection, unspecified: Secondary | ICD-10-CM | POA: Diagnosis not present

## 2017-07-28 DIAGNOSIS — R509 Fever, unspecified: Secondary | ICD-10-CM

## 2017-07-28 DIAGNOSIS — R52 Pain, unspecified: Secondary | ICD-10-CM | POA: Diagnosis not present

## 2017-07-28 LAB — POC INFLUENZA A&B (BINAX/QUICKVUE)
INFLUENZA A, POC: NEGATIVE
Influenza B, POC: NEGATIVE

## 2017-07-28 NOTE — Patient Instructions (Signed)
  Drink plenty of water. You can continue the mucinex fast-max if you want, vs changing to longer-acting medication.  These would be 12 hour Mucinex, 12 hour sudafed (behind the counter), delsym syrup (12 hour cough suppressant). Use ibuprofen or aleve, and/or tylenol as needed for fever or pain (sore throat). Try the neti-pot once or twice daily. Your Theraflu night-time likely also contains diphenhydramine--this is the ingredient that is Benadryl. If needed for sleep, you can take separate benadryl 25mg  in addition to the mucinex, sudafed, delsym, etc.  Contact us if your symptoms persist or worsen--higher fevers, phlegm is getting more discolored, not improving.

## 2017-07-28 NOTE — Progress Notes (Signed)
Chief Complaint  Patient presents with  . Cough    and congestion, ST that started Thursday night. Fever and chills off and on. Husband and son are sick. Mucus is dark yellow.    Tingly/sick feeling started in her face Thursday evening (4 days ago). Started sneezing on Friday, postnasal drainage.    She took Mucinex fast-max regularly through the weekend, and it helped. She last took it this morning.  She slowly feels her drainage moving down. She is complaining of worsening sore throat and worsening cough.  Hacking, productive cough.  Cough is dark yellow-brown.  Not blowing any mucus from her nose.  Her cheeks have some pressure, feels swollen.  Back upper teeth hurt some.  Also used humidifier. Took theraflu last night and slept well last night.  Bought Delsym, hasn't taken it yet. Hasn't checked fevers at home, felt low grade, some mild chills.  +sick contacts (URI symptoms in husband, son; husband also had diarrhea). Son was febrile starting Friday, and is improving.  PMH, PSH, SH reviewed  Outpatient Encounter Medications as of 07/28/2017  Medication Sig Note  . b complex vitamins tablet Take 1 tablet by mouth daily.   . cholecalciferol (VITAMIN D) 1000 units tablet Take 1,000 Units by mouth daily. 01/16/2017: Can't recall the dose, 1000 or 5000 IU  . fluticasone (FLONASE) 50 MCG/ACT nasal spray Place 2 sprays into both nostrils daily.   Marland Kitchen. loratadine (CLARITIN) 10 MG tablet Take 10 mg by mouth daily.   Marland Kitchen. ALPRAZolam (XANAX) 0.25 MG tablet Take 1-2 tablets (0.25-0.5 mg total) by mouth 3 (three) times daily as needed for anxiety or sleep. (Patient not taking: Reported on 07/28/2017)   . tretinoin (RETIN-A) 0.025 % cream Apply topically at bedtime. (Patient not taking: Reported on 04/23/2017) 01/16/2017: No longer needing since diet improved, her skin improved  . [DISCONTINUED] Nutritional Supplements (ESTROVEN PO) Take 1 tablet by mouth daily.    No facility-administered encounter  medications on file as of 07/28/2017.    Allergies  Allergen Reactions  . Citric Acid Other (See Comments)    GI symptoms  . Fructose Other (See Comments)    Lower G.I. symptoms   ROS:  +lowgrade fever/chills, sinus headache, sore throat, cough. No nausea, vomiting, diarrhea, bleeding, bruising, rash, pleuritic chest pain, shortness of breath, urinary complaints.  See HPI  PHYSICAL EXAM:  BP 122/84   Pulse 88   Temp 100 F (37.8 C) (Oral)   Ht 5\' 6"  (1.676 m)   Wt 125 lb (56.7 kg) Comment: boots on  BMI 20.18 kg/m   Well appearing, pleasant female, with intermittent throat-clearing and dry-sounding cough.  Speaking easily, in no distress HEENT: PERRL, EOMI, conjunctiva and sclera are clear. TM's and EAC's are normal. Nasal mucosa is mod-severely edematous with clear mucus on the right, and thick white mucus on the left.  Sinuses are mildly tender at both maxillary sinuses.  OP is clear--no erythema, cobblestoning, ulcers or other abnormalities. Neck; no lymphadenopathy or masses Heart: regular rate and rhythm Lungs: clear bilaterally, good air movement, no wheezes, rales, or ronchi Extremities: no edema Skin: normal turgor, no rash Neuro: alert and oriented, normal cranial nerves, gait Psych: normal mood, affect, hygiene and grooming  Flu test negative for A&B  ASSESSMENT/PLAN:  Viral upper respiratory infection - supportive measures reviewed, and s/sx bacterial infection  Fever, unspecified fever cause - Plan: POC Influenza A&B (Binax test)  Body aches - Plan: POC Influenza A&B (Binax test)  Patient to return for  [place] for flu shot next week, when feeling better.    Drink plenty of water. You can continue the mucinex fast-max if you want, vs changing to longer-acting medication.  These would be 12 hour Mucinex, 12 hour sudafed (behind the counter), delsym syrup (12 hour cough suppressant). Use ibuprofen or aleve, and/or tylenol as needed for fever or pain (sore  throat). Try the neti-pot once or twice daily. Your Theraflu night-time likely also contains diphenhydramine--this is the ingredient that is Benadryl. If needed for sleep, you can take separate benadryl 25mg  in addition to the mucinex, sudafed, delsym, etc.  Contact us if your symptoms persist or worsen--higher fevers, phlegm is getting more discolored, not improving.

## 2017-08-29 ENCOUNTER — Telehealth: Payer: Self-pay | Admitting: Family Medicine

## 2017-08-29 DIAGNOSIS — F4322 Adjustment disorder with anxiety: Secondary | ICD-10-CM

## 2017-08-29 MED ORDER — ALPRAZOLAM 0.25 MG PO TABS: 0.2500 mg | ORAL_TABLET | Freq: Three times a day (TID) | ORAL | 0 refills | Status: DC | PRN

## 2017-08-29 NOTE — Telephone Encounter (Signed)
Pt left message would like refill on her Xanax

## 2017-08-29 NOTE — Telephone Encounter (Signed)
Last filled 8/22

## 2017-09-05 LAB — HM PAP SMEAR: HM PAP: NORMAL

## 2017-09-05 LAB — HM MAMMOGRAPHY

## 2017-09-17 ENCOUNTER — Encounter: Payer: Self-pay | Admitting: *Deleted

## 2017-10-16 ENCOUNTER — Encounter: Payer: Self-pay | Admitting: *Deleted

## 2017-10-21 ENCOUNTER — Other Ambulatory Visit: Payer: Self-pay | Admitting: Obstetrics and Gynecology

## 2017-10-21 DIAGNOSIS — Z803 Family history of malignant neoplasm of breast: Secondary | ICD-10-CM

## 2017-10-30 ENCOUNTER — Telehealth: Payer: Self-pay

## 2017-10-30 ENCOUNTER — Encounter: Payer: Self-pay | Admitting: Family Medicine

## 2017-10-30 NOTE — Telephone Encounter (Signed)
I called pt gave Number to call for Mychart assistance

## 2017-10-30 NOTE — Telephone Encounter (Signed)
Pt was having issues with signing up with my chart. Please advise pt thanks [facility]

## 2017-10-31 ENCOUNTER — Ambulatory Visit
Admission: RE | Admit: 2017-10-31 | Discharge: 2017-10-31 | Disposition: A | Discharge status: Home / Self Care | Payer: BLUE CROSS/BLUE SHIELD | Source: Ambulatory Visit | Attending: Obstetrics and Gynecology | Admitting: Obstetrics and Gynecology

## 2017-10-31 DIAGNOSIS — Z803 Family history of malignant neoplasm of breast: Secondary | ICD-10-CM

## 2017-10-31 MED ORDER — GADOBENATE DIMEGLUMINE 529 MG/ML IV SOLN
10.0000 mL | Freq: Once | INTRAVENOUS | Status: AC | PRN
  Administered 2017-10-31: 10 mL via INTRAVENOUS

## 2017-11-10 ENCOUNTER — Other Ambulatory Visit: Payer: Self-pay | Admitting: Obstetrics and Gynecology

## 2017-11-10 DIAGNOSIS — R9389 Abnormal findings on diagnostic imaging of other specified body structures: Secondary | ICD-10-CM

## 2017-11-19 ENCOUNTER — Other Ambulatory Visit: Payer: Self-pay | Admitting: Obstetrics and Gynecology

## 2017-11-19 ENCOUNTER — Ambulatory Visit
Admission: RE | Admit: 2017-11-19 | Discharge: 2017-11-19 | Disposition: A | Discharge status: Home / Self Care | Payer: Managed Care, Other (non HMO) | Source: Ambulatory Visit | Attending: Obstetrics and Gynecology | Admitting: Obstetrics and Gynecology

## 2017-11-19 DIAGNOSIS — R9389 Abnormal findings on diagnostic imaging of other specified body structures: Secondary | ICD-10-CM

## 2017-11-19 MED ORDER — GADOBENATE DIMEGLUMINE 529 MG/ML IV SOLN
11.0000 mL | Freq: Once | INTRAVENOUS | Status: AC | PRN
  Administered 2017-11-19: 11 mL via INTRAVENOUS

## 2018-01-28 ENCOUNTER — Encounter: Payer: 59 | Admitting: Family Medicine

## 2018-02-08 NOTE — Progress Notes (Signed)
Chief Complaint  Patient presents with  . cpe    cpe not fasting. no other concerns. sees obgyn. gets eye checked yearly    Sabrina Chapman is a 47 y.o. female who presents for a complete physical.  She has the following concerns:  Anxiety/adjustment disorder--last discussed in August, when she was having trouble coping, sleeping related to her husband's stroke, and his inability to care for their children. She was prescribed #20 of alprazolam, which was last refilled 08/29/17. Rarely needs it, has only taken one in the last six months.  Her husband is doing much better-- he is back to work.  Some subtle issues, sensory, hearing. Their larger stressor now is related to behavioral issues with her 805 yo daughter, Sabrina Chapman (hyper, misbehaving in school, some violent behavior towards classmates); currently in counseling.  She is due to start Kindergarten next year. She started taking some supplements (SAM-E, L-theanine, fish oil) which has been helpful.  Allergies: accupuncture has helped with her allergies.  She hasn't had any vertigo since her last accupuncture treatment a few week sago. She takes claritin daily. Denies runny nose, sneezing. Denies postnasal drainage (just intermittently); does report some ear plugging, popping, and eyes sometimes itch. Vertigo seems to flare when she is somewhat congested.  It seems to be positional, worse after getting an interrupted night's sleep.  Meclizine makes her sleepy, so doesn't take it.  Acne: Previously prescribed Retin-A for acne on face and neck. She had a lot of flaking/dryness, didn't use it consistently. Skin improved when she cut out dairy, grains and sugar (Whole 30 diet).  She is no longer on the Whole 30 diet. Only has had 2-3 periods in the last year, and skin has remained improved.  Vitamin D deficiency:  Level was low at 24 two years ago.  Treated with 12 weeks of prescription therapy. Last year it was 85 (?on 5000 IU daily). She reports  she cut back on the dose, but doesn't recall what it is now.Remembers that she cut back to the dose we recommended at the time (which was likely 1000 IU).  Immunization History  Administered Date(s) Administered  . Influenza Split 07/17/2012  . Influenza,inj,Quad PF,6+ Mos 09/15/2013, 07/05/2014, 05/02/2016  . Influenza-Unspecified 10/14/2017  . PPD Test 11/25/2012  . Tdap 07/16/2012   Last Pap smear: 09/2017, Dr. Henderson CloudHorvath Last mammogram: yearly at Dr. Kittie PlaterHorvath's, last 09/2017 Last colonoscopy: never  Last DEXA: never Dentist: Twice yearly Ophtho: yearly (has contacts, rarely wears) Exercise: Joined [employer], but it hasn't opened yet. Walks with kids every other day (while they are on bikes, walking).  Carries children. More sedentary job. Lipids: Lab Results  Component Value Date   CHOL 219 (H) 01/23/2017   HDL 83 01/23/2017   LDLCALC 128 (H) 01/23/2017   TRIG 40 01/23/2017   CHOLHDL 2.6 01/23/2017    Past Medical History:  Diagnosis Date  . Allergy    seasonal, plus cats, dust  . AMA (advanced maternal age) multigravida 35+   . Anxiety in her early 20's   took Paxil  . GERD (gastroesophageal reflux disease)   . IBS (irritable bowel syndrome)    Past Surgical History:  Procedure Laterality Date  . BILATERAL SALPINGECTOMY  09/23/14   done at time of c-section, per pt  . BUNIONECTOMY Right 12/2016   Dr. Charlsie Merlesegal  . CESAREAN SECTION  07/15/2012   Procedure: CESAREAN SECTION;  Surgeon: Loney LaurenceMichelle A Horvath, MD;  Location: [facility];  Service: Obstetrics;  Laterality: N/A;  .  CESAREAN SECTION N/A 09/23/2014   Procedure: CESAREAN SECTION;  Surgeon: Loney Laurence, MD;  Location: [facility];  Service: Obstetrics;  Laterality: N/A;  . DILATION AND CURETTAGE OF UTERUS  2008  . sinus surgery  age 89   polyp removal     Social History   Socioeconomic History  . Marital status: Married    Spouse name: Not on file  . Number of children: 2  . Years of education: Not on file  .  Highest education level: Not on file  Occupational History  . Occupation: Interior and spatial designer at [employer]  . Financial resource strain: Not on file  . Food insecurity:    Worry: Not on file    Inability: Not on file  . Transportation needs:    Medical: Not on file    Non-medical: Not on file  Tobacco Use  . Smoking status: Former Smoker    Packs/day: 0.25    Years: 5.00    Pack years: 1.25    Last attempt to quit: 09/02/1997    Years since quitting: 20.4  . Smokeless tobacco: Never Used  Substance and Sexual Activity  . Alcohol use: Yes    Comment: 1 glass of wine per day (4x/week)  . Drug use: No  . Sexual activity: Yes    Partners: Male    Birth control/protection: Surgical  Lifestyle  . Physical activity:    Days per week: Not on file    Minutes per session: Not on file  . Stress: Not on file  Relationships  . Social connections:    Talks on phone: Not on file    Gets together: Not on file    Attends religious service: Not on file    Active member of club or organization: Not on file    Attends meetings of clubs or organizations: Not on file    Relationship status: Not on file  . Intimate partner violence:    Fear of current or ex partner: Not on file    Emotionally abused: Not on file    Physically abused: Not on file    Forced sexual activity: Not on file  Other Topics Concern  . Not on file  Social History Narrative   Married, 1 daughter Sabrina Poag, 1 son Sabrina Chapman; 2 dog, 1 cat (kept shaven). Works at [employer].    Family History  Problem Relation Age of Onset  . Cancer Mother        breast @62 , ovarian @65   . Hyperlipidemia Mother   . Heart disease Mother 12       by pass surgery  . Breast cancer Mother 42  . Ovarian cancer Mother 1       metastatic to abd/omentum (diagnosed after finding pleural effusions)  . Hyperlipidemia Father   . Allergies Father   . Depression Father   . Cancer Maternal Grandmother 50       breast cancer  . Heart  disease Maternal Grandfather   . Depression Paternal Grandmother   . Diabetes Neg Hx     Outpatient Encounter Medications as of 02/09/2018  Medication Sig Note  . ALPRAZolam (XANAX) 0.25 MG tablet Take 1-2 tablets (0.25-0.5 mg total) by mouth 3 (three) times daily as needed for anxiety or sleep. 02/09/2018: Uses very rarely, once in the last 6 months  . b complex vitamins tablet Take 1 tablet by mouth daily.   . cholecalciferol (VITAMIN D) 1000 units tablet Take 1,000 Units by mouth daily.   Marland Kitchen  loratadine (CLARITIN) 10 MG tablet Take 10 mg by mouth daily.   . [DISCONTINUED] tretinoin (RETIN-A) 0.025 % cream Apply topically at bedtime. 01/16/2017: No longer needing since diet improved, her skin improved  . [DISCONTINUED] fluticasone (FLONASE) 50 MCG/ACT nasal spray Place 2 sprays into both nostrils daily.    No facility-administered encounter medications on file as of 02/09/2018.     Allergies  Allergen Reactions  . Citric Acid Other (See Comments)    GI symptoms  . Fructose Other (See Comments)    Lower G.I. symptoms    ROS: The patient denies anorexia, fever, headaches, vision changes, decreased hearing, ear pain, sore throat, breast concerns, chest pain, palpitations, syncope, dyspnea on exertion, cough, swelling, nausea, vomiting, diarrhea, constipation, abdominal pain, melena, hematochezia, indigestion/heartburn, hematuria, incontinence, dysuria, vaginal discharge, odor or itch, genital lesions, joint pains, numbness, tingling, weakness, tremor, suspicious skin lesions, depression, anxiety, abnormal bleeding/bruising, or enlarged lymph nodes.  Allergies per HPI. Vertigo per HPI, intermittent. Acne resolved. Irregular menses (2-3 in the last year). No hot flashes/night sweats since getting accupuncture sessions (none in the last 3-4 weeks). +weight gain (regained weight she lost last year) noted. She is considering breast reduction surgery--has large indents from bra straps. Denies  significant pain.   PHYSICAL EXAM:  BP 112/70   Pulse 71   Ht 5\' 7"  (1.702 m)   Wt 127 lb 12.8 oz (58 kg)   LMP 01/28/2018   SpO2 98%   BMI 20.02 kg/m   Wt Readings from Last 3 Encounters:  02/09/18 127 lb 12.8 oz (58 kg)  07/28/17 125 lb (56.7 kg)  04/23/17 117 lb 3.2 oz (53.2 kg)    General Appearance:  Alert, cooperative, no distress, appears stated age  Head:  Normocephalic, without obvious abnormality, atraumatic  Eyes:  PERRL, conjunctiva/corneas clear, EOM's intact, fundi benign  Ears:  Normal TM's and external ear canals  Nose: Nares normal, mucosa is moderately edematous with clear mucus; no sinus tenderness  Throat: Lips, mucosa, and tongue normal; teeth and gums normal  Neck: Supple, no lymphadenopathy; thyroid: no enlargement/ tenderness/nodules; no carotid bruit or JVD  Back:  Spine nontender, no curvature, ROM normal, no CVA tenderness  Lungs:  Clear to auscultation bilaterally without wheezes, rales or ronchi; respirations unlabored  Chest Wall:  No tenderness or deformity  Heart:  Regular rate and rhythm, S1 and S2 normal, no murmur, rub or gallop  Breast Exam:  Deferred to GYN  Abdomen:  Soft, non-tender, nondistended, normoactive bowel sounds,no masses, no hepatosplenomegaly  Genitalia:  Deferred to GYN     Extremities: No clubbing, cyanosis or edema. WHSS right foot.  Pulses: 2+ and symmetric all extremities  Skin: Skin color, texture, turgor normal  Lymph nodes: Cervical, supraclavicular, and axillary nodes normal  Neurologic: CNII-XII intact, normal strength, sensation and gait; reflexes 2+ and symmetric throughout    Psych: Normal mood, affect, hygiene and grooming   ASSESSMENT/PLAN:  Annual physical exam  Vitamin D deficiency - desires recheck, to ensure that current dose is appropriate - Plan: VITAMIN D 25 Hydroxy (Vit-D Deficiency, Fractures)  Allergic rhinitis,  unspecified seasonality, unspecified trigger - inadequately treated with claritin alone. Recommended Flonase.  To call for rx if less expensive and effective. Reviewed proper technique    Discussed monthly self breast exams and yearly mammograms; at least 30 minutes of aerobic activity at least 5 days/week, weight-bearing exercise 2-3x/wk; proper sunscreen use reviewed; healthy diet, including goals of calcium and vitamin D intake and alcohol recommendations (less  than or equal to 1 drink/day) reviewed; regular seatbelt use; changing batteries in smoke detectors. Immunization recommendations discussed--yearly flu shots recommended. Shingrix age 48.Colonoscopy recommendations reviewed, age 84.  Discussed exercise goals, relaxation techniques. Discussed her daughter's behavioral issues and agree with treatment plan (she is trying more holistic approach to try and avoid medications).  Seems to be dealing well.    Consider trying Flonase daily for your allergies to see if this helps better control your allergies and also the vertigo. You may continue the claritin daily, if needed. It takes over a week to see the full effect from the flonase. If it causes nasal irritation, you may use nasal saline spray to help.

## 2018-02-09 ENCOUNTER — Ambulatory Visit: Payer: Managed Care, Other (non HMO) | Admitting: Family Medicine

## 2018-02-09 ENCOUNTER — Encounter: Payer: Self-pay | Admitting: Family Medicine

## 2018-02-09 VITALS — BP 112/70 | HR 71 | Ht 67.0 in | Wt 127.8 lb

## 2018-02-09 DIAGNOSIS — J309 Allergic rhinitis, unspecified: Secondary | ICD-10-CM

## 2018-02-09 DIAGNOSIS — E559 Vitamin D deficiency, unspecified: Secondary | ICD-10-CM

## 2018-02-09 DIAGNOSIS — Z0000 Encounter for general adult medical examination without abnormal findings: Secondary | ICD-10-CM | POA: Diagnosis not present

## 2018-02-09 NOTE — Patient Instructions (Addendum)
  HEALTH MAINTENANCE RECOMMENDATIONS:  It is recommended that you get at least 30 minutes of aerobic exercise at least 5 days/week (for weight loss, you may need as much as 60-90 minutes). This can be any activity that gets your heart rate up. This can be divided in 10-15 minute intervals if needed, but try and build up your endurance at least once a week.  Weight bearing exercise is also recommended twice weekly.  Eat a healthy diet with lots of vegetables, fruits and fiber.  "Colorful" foods have a lot of vitamins (ie green vegetables, tomatoes, red peppers, etc).  Limit sweet tea, regular sodas and alcoholic beverages, all of which has a lot of calories and sugar.  Up to 1 alcoholic drink daily may be beneficial for women (unless trying to lose weight, watch sugars).  Drink a lot of water.  Calcium recommendations are 1200-1500 mg daily (1500 mg for postmenopausal women or women without ovaries), and vitamin D 1000 IU daily.  This should be obtained from diet and/or supplements (vitamins), and calcium should not be taken all at once, but in divided doses.  Monthly self breast exams and yearly mammograms for women over the age of 46 is recommended.  Sunscreen of at least SPF 30 should be used on all sun-exposed parts of the skin when outside between the hours of 10 am and 4 pm (not just when at beach or pool, but even with exercise, golf, tennis, and yard work!)  Use a sunscreen that says "broad spectrum" so it covers both UVA and UVB rays, and make sure to reapply every 1-2 hours.  Remember to change the batteries in your smoke detectors when changing your clock times in the spring and fall.  Use your seat belt every time you are in a car, and please drive safely and not be distracted with cell phones and texting while driving.   Consider trying Flonase daily for your allergies to see if this helps better control your allergies and also the vertigo. You may continue the claritin daily, if  needed. It takes over a week to see the full effect from the flonase. If it causes nasal irritation, you may use nasal saline spray to help.

## 2018-02-10 LAB — VITAMIN D 25 HYDROXY (VIT D DEFICIENCY, FRACTURES): Vit D, 25-Hydroxy: 59.8 ng/mL (ref 30.0–100.0)

## 2018-03-11 ENCOUNTER — Encounter: Payer: Self-pay | Admitting: *Deleted

## 2018-03-11 ENCOUNTER — Encounter: Payer: Self-pay | Admitting: Family Medicine

## 2018-06-24 NOTE — H&P (Signed)
Subjective:     Patient ID: Sabrina Chapman is a 47 y.o. female.  HPI  Here for follow up discussion prior to planned breast reduction. Current 34 DDD to 30 I depending on bra. Complains of neck, shoulder, and back pain of several year duration. She has associated tension HA which she attributes in part to weight of breasts. This has not improved with specialty fitted bras, OTC pain medication, hot/cold packs. Denies rashes beneath breasts; reports rashes beneath arms worse in warm weather. No prior PT. Reports numbness hands.   Wt up 5 lbs since 09/2016, reports difficulty with exercise due to breast size. Reports sports bras "strangle" her.    The patient's mother was diagnosed with breast cancer at the age of 8 and her MGM  at the age of 24. Reports negative genetic testing 2019. MMG 09/2017. Had high risk screening MRI 10/2017 with right breast linear enhancement noted- two biopsies labeled RIGHT LIQ anterior and posterior with PASH.  Has had prior consult with Dr. Ulice Bold 09/2016. Lives with husband and 2 children. Works as Primary school teacher.     Objective:   Physical Exam  Cardiovascular: Normal rate, regular rhythm and normal heart sounds.  Pulmonary/Chest: Effort normal and breath sounds normal.  Skin:  Fitzpatrick 2     grade 3 ptosis bilateral SN to nipple R 27 L 27.5 cm BW R18 L 18 cm Nipple to IMF R 13.5 L 13.5 cm  + shoulder grooving Assessment:     Macromastia Chronic neck and back pain    Plan:     Reviewed reduction with anchor type scars, drains, post operative visits and limitations, recovery. Diminished sensation nipple and breast skin, risk of nipple loss, wound healing problems, asymmetry, incidental carcinoma, changes with wt gain/loss, aging, unacceptable cosmetic appearance reviewed. Cannot assure cup size but her goal is B cup.   Additional risks including but not limited to bleeding, hematoma, seroma, fat necrosis, damage to  deeper structures need for additional procedures, DVT/PE, cardiopulmonary complications reviewed.  Plan OP surgery.  Rx for  Percocet given.  Anticipate 351 g reduction from each breast.     Sabrina Fellows, MD [facility] Plastic & Reconstructive Surgery [phone], pin [phone]

## 2018-07-03 DIAGNOSIS — N62 Hypertrophy of breast: Secondary | ICD-10-CM

## 2018-07-03 HISTORY — DX: Hypertrophy of breast: N62

## 2018-07-17 ENCOUNTER — Other Ambulatory Visit: Payer: Self-pay

## 2018-07-17 ENCOUNTER — Encounter (HOSPITAL_BASED_OUTPATIENT_CLINIC_OR_DEPARTMENT_OTHER): Payer: Self-pay | Admitting: *Deleted

## 2018-07-17 NOTE — Pre-Procedure Instructions (Signed)
To come pick up Ensure pre-surgery drink 10 oz. - to drink by 0730 DOS. 

## 2018-07-23 NOTE — Progress Notes (Signed)
Presurgical ensure given to pt along with instructions when to drink.

## 2018-07-24 ENCOUNTER — Ambulatory Visit (HOSPITAL_BASED_OUTPATIENT_CLINIC_OR_DEPARTMENT_OTHER)
Admission: RE | Admit: 2018-07-24 | Discharge: 2018-07-24 | Disposition: A | Discharge status: Home / Self Care | Payer: Managed Care, Other (non HMO) | Source: Ambulatory Visit | Attending: Plastic Surgery | Admitting: Plastic Surgery

## 2018-07-24 ENCOUNTER — Ambulatory Visit (HOSPITAL_BASED_OUTPATIENT_CLINIC_OR_DEPARTMENT_OTHER): Payer: Managed Care, Other (non HMO) | Admitting: Anesthesiology

## 2018-07-24 ENCOUNTER — Other Ambulatory Visit: Payer: Self-pay

## 2018-07-24 ENCOUNTER — Encounter (HOSPITAL_BASED_OUTPATIENT_CLINIC_OR_DEPARTMENT_OTHER)
Admission: RE | Disposition: A | Discharge status: Home / Self Care | Payer: Self-pay | Source: Ambulatory Visit | Attending: Plastic Surgery

## 2018-07-24 ENCOUNTER — Encounter (HOSPITAL_BASED_OUTPATIENT_CLINIC_OR_DEPARTMENT_OTHER): Payer: Self-pay | Admitting: Certified Registered Nurse Anesthetist

## 2018-07-24 DIAGNOSIS — Z803 Family history of malignant neoplasm of breast: Secondary | ICD-10-CM | POA: Diagnosis not present

## 2018-07-24 DIAGNOSIS — Z87891 Personal history of nicotine dependence: Secondary | ICD-10-CM | POA: Insufficient documentation

## 2018-07-24 DIAGNOSIS — N62 Hypertrophy of breast: Secondary | ICD-10-CM | POA: Diagnosis present

## 2018-07-24 HISTORY — DX: Dental restoration status: Z98.811

## 2018-07-24 HISTORY — DX: Hypertrophy of breast: N62

## 2018-07-24 HISTORY — PX: BREAST REDUCTION SURGERY: SHX8

## 2018-07-24 SURGERY — MAMMOPLASTY, REDUCTION
Anesthesia: General | Site: Breast | Laterality: Bilateral

## 2018-07-24 MED ORDER — ACETAMINOPHEN 500 MG PO TABS
1000.0000 mg | ORAL_TABLET | ORAL | Status: AC
  Administered 2018-07-24: 1000 mg via ORAL

## 2018-07-24 MED ORDER — DEXAMETHASONE SODIUM PHOSPHATE 4 MG/ML IJ SOLN
INTRAMUSCULAR | Status: DC | PRN
  Administered 2018-07-24: 10 mg via INTRAVENOUS

## 2018-07-24 MED ORDER — ROCURONIUM BROMIDE 100 MG/10ML IV SOLN
INTRAVENOUS | Status: DC | PRN
  Administered 2018-07-24: 40 mg via INTRAVENOUS
  Administered 2018-07-24: 20 mg via INTRAVENOUS

## 2018-07-24 MED ORDER — ROCURONIUM BROMIDE 50 MG/5ML IV SOSY
PREFILLED_SYRINGE | INTRAVENOUS | Status: AC
  Filled 2018-07-24: qty 5

## 2018-07-24 MED ORDER — ONDANSETRON HCL 4 MG/2ML IJ SOLN
INTRAMUSCULAR | Status: DC | PRN
  Administered 2018-07-24: 4 mg via INTRAVENOUS

## 2018-07-24 MED ORDER — SCOPOLAMINE 1 MG/3DAYS TD PT72: 1.0000 | MEDICATED_PATCH | TRANSDERMAL | Status: DC

## 2018-07-24 MED ORDER — GABAPENTIN 300 MG PO CAPS
300.0000 mg | ORAL_CAPSULE | ORAL | Status: AC
  Administered 2018-07-24: 300 mg via ORAL

## 2018-07-24 MED ORDER — PROMETHAZINE HCL 25 MG/ML IJ SOLN
6.2500 mg | INTRAMUSCULAR | Status: DC | PRN
  Administered 2018-07-24: 6.25 mg via INTRAVENOUS

## 2018-07-24 MED ORDER — FENTANYL CITRATE (PF) 100 MCG/2ML IJ SOLN
INTRAMUSCULAR | Status: AC
  Filled 2018-07-24: qty 2

## 2018-07-24 MED ORDER — ACETAMINOPHEN 500 MG PO TABS
ORAL_TABLET | ORAL | Status: AC
  Filled 2018-07-24: qty 2

## 2018-07-24 MED ORDER — FENTANYL CITRATE (PF) 100 MCG/2ML IJ SOLN
INTRAMUSCULAR | Status: DC | PRN
  Administered 2018-07-24 (×4): 50 ug via INTRAVENOUS

## 2018-07-24 MED ORDER — MIDAZOLAM HCL 2 MG/2ML IJ SOLN
INTRAMUSCULAR | Status: AC
  Filled 2018-07-24: qty 2

## 2018-07-24 MED ORDER — GABAPENTIN 300 MG PO CAPS
ORAL_CAPSULE | ORAL | Status: AC
  Filled 2018-07-24: qty 1

## 2018-07-24 MED ORDER — PROPOFOL 10 MG/ML IV BOLUS
INTRAVENOUS | Status: DC | PRN
  Administered 2018-07-24: 200 mg via INTRAVENOUS

## 2018-07-24 MED ORDER — SCOPOLAMINE 1 MG/3DAYS TD PT72
1.0000 | MEDICATED_PATCH | Freq: Once | TRANSDERMAL | Status: AC | PRN
  Administered 2018-07-24: 1 via TRANSDERMAL

## 2018-07-24 MED ORDER — SODIUM CHLORIDE (PF) 0.9 % IJ SOLN
INTRAMUSCULAR | Status: AC
  Filled 2018-07-24: qty 20

## 2018-07-24 MED ORDER — CHLORHEXIDINE GLUCONATE CLOTH 2 % EX PADS: 6.0000 | MEDICATED_PAD | Freq: Once | CUTANEOUS | Status: DC

## 2018-07-24 MED ORDER — SODIUM CHLORIDE 0.9 % IV SOLN
INTRAVENOUS | Status: DC | PRN
  Administered 2018-07-24: 40 mL

## 2018-07-24 MED ORDER — EPHEDRINE SULFATE 50 MG/ML IJ SOLN
INTRAMUSCULAR | Status: DC | PRN
  Administered 2018-07-24: 10 mg via INTRAVENOUS

## 2018-07-24 MED ORDER — FENTANYL CITRATE (PF) 100 MCG/2ML IJ SOLN: 50.0000 ug | INTRAMUSCULAR | Status: DC | PRN

## 2018-07-24 MED ORDER — SUGAMMADEX SODIUM 200 MG/2ML IV SOLN
INTRAVENOUS | Status: AC
  Filled 2018-07-24: qty 2

## 2018-07-24 MED ORDER — 0.9 % SODIUM CHLORIDE (POUR BTL) OPTIME
TOPICAL | Status: DC | PRN
  Administered 2018-07-24: 200 mL

## 2018-07-24 MED ORDER — PROPOFOL 10 MG/ML IV BOLUS
INTRAVENOUS | Status: AC
  Filled 2018-07-24: qty 20

## 2018-07-24 MED ORDER — LACTATED RINGERS IV SOLN
INTRAVENOUS | Status: DC
  Administered 2018-07-24 (×2): via INTRAVENOUS

## 2018-07-24 MED ORDER — BUPIVACAINE LIPOSOME 1.3 % IJ SUSP
INTRAMUSCULAR | Status: AC
  Filled 2018-07-24: qty 20

## 2018-07-24 MED ORDER — CELECOXIB 200 MG PO CAPS
ORAL_CAPSULE | ORAL | Status: AC
  Filled 2018-07-24: qty 1

## 2018-07-24 MED ORDER — SUGAMMADEX SODIUM 200 MG/2ML IV SOLN
INTRAVENOUS | Status: DC | PRN
  Administered 2018-07-24: 150 mg via INTRAVENOUS

## 2018-07-24 MED ORDER — DEXAMETHASONE SODIUM PHOSPHATE 10 MG/ML IJ SOLN
INTRAMUSCULAR | Status: AC
  Filled 2018-07-24: qty 1

## 2018-07-24 MED ORDER — MIDAZOLAM HCL 5 MG/5ML IJ SOLN
INTRAMUSCULAR | Status: DC | PRN
  Administered 2018-07-24: 2 mg via INTRAVENOUS

## 2018-07-24 MED ORDER — ONDANSETRON HCL 4 MG/2ML IJ SOLN
INTRAMUSCULAR | Status: AC
  Filled 2018-07-24: qty 2

## 2018-07-24 MED ORDER — LIDOCAINE 2% (20 MG/ML) 5 ML SYRINGE
INTRAMUSCULAR | Status: AC
  Filled 2018-07-24: qty 5

## 2018-07-24 MED ORDER — MIDAZOLAM HCL 2 MG/2ML IJ SOLN: 1.0000 mg | INTRAMUSCULAR | Status: DC | PRN

## 2018-07-24 MED ORDER — CELECOXIB 200 MG PO CAPS
200.0000 mg | ORAL_CAPSULE | ORAL | Status: AC
  Administered 2018-07-24: 200 mg via ORAL

## 2018-07-24 MED ORDER — FENTANYL CITRATE (PF) 100 MCG/2ML IJ SOLN
25.0000 ug | INTRAMUSCULAR | Status: DC | PRN
  Administered 2018-07-24: 25 ug via INTRAVENOUS
  Administered 2018-07-24: 50 ug via INTRAVENOUS
  Administered 2018-07-24: 25 ug via INTRAVENOUS

## 2018-07-24 MED ORDER — CEFAZOLIN SODIUM-DEXTROSE 2-4 GM/100ML-% IV SOLN
INTRAVENOUS | Status: AC
  Filled 2018-07-24: qty 100

## 2018-07-24 MED ORDER — SUGAMMADEX SODIUM 500 MG/5ML IV SOLN
INTRAVENOUS | Status: AC
  Filled 2018-07-24: qty 5

## 2018-07-24 MED ORDER — PHENYLEPHRINE HCL 10 MG/ML IJ SOLN
INTRAMUSCULAR | Status: DC | PRN
  Administered 2018-07-24: 40 ug via INTRAVENOUS

## 2018-07-24 MED ORDER — PROMETHAZINE HCL 25 MG/ML IJ SOLN
INTRAMUSCULAR | Status: AC
  Filled 2018-07-24: qty 1

## 2018-07-24 MED ORDER — CEFAZOLIN SODIUM-DEXTROSE 2-4 GM/100ML-% IV SOLN
2.0000 g | INTRAVENOUS | Status: AC
  Administered 2018-07-24: 2 g via INTRAVENOUS

## 2018-07-24 MED ORDER — LIDOCAINE HCL (CARDIAC) PF 100 MG/5ML IV SOSY
PREFILLED_SYRINGE | INTRAVENOUS | Status: DC | PRN
  Administered 2018-07-24: 50 mg via INTRAVENOUS

## 2018-07-24 SURGICAL SUPPLY — 62 items
APPLIER CLIP 9.375 MED OPEN (MISCELLANEOUS)
BINDER BREAST 3XL (GAUZE/BANDAGES/DRESSINGS) IMPLANT
BINDER BREAST LRG (GAUZE/BANDAGES/DRESSINGS) IMPLANT
BINDER BREAST MEDIUM (GAUZE/BANDAGES/DRESSINGS) ×2 IMPLANT
BINDER BREAST XLRG (GAUZE/BANDAGES/DRESSINGS) IMPLANT
BINDER BREAST XXLRG (GAUZE/BANDAGES/DRESSINGS) IMPLANT
BLADE SURG 10 STRL SS (BLADE) ×8 IMPLANT
BNDG GAUZE ELAST 4 BULKY (GAUZE/BANDAGES/DRESSINGS) ×4 IMPLANT
CANISTER SUCT 1200ML W/VALVE (MISCELLANEOUS) ×2 IMPLANT
CHLORAPREP W/TINT 26ML (MISCELLANEOUS) ×4 IMPLANT
CLIP APPLIE 9.375 MED OPEN (MISCELLANEOUS) IMPLANT
CLIP VESOCCLUDE MED 6/CT (CLIP) IMPLANT
COVER BACK TABLE 60X90IN (DRAPES) ×2 IMPLANT
COVER MAYO STAND STRL (DRAPES) ×2 IMPLANT
COVER WAND RF STERILE (DRAPES) IMPLANT
DERMABOND ADVANCED (GAUZE/BANDAGES/DRESSINGS) ×4
DERMABOND ADVANCED .7 DNX12 (GAUZE/BANDAGES/DRESSINGS) ×4 IMPLANT
DRAIN CHANNEL 15F RND FF W/TCR (WOUND CARE) ×4 IMPLANT
DRAPE TOP ARMCOVERS (MISCELLANEOUS) ×2 IMPLANT
DRAPE U-SHAPE 76X120 STRL (DRAPES) ×2 IMPLANT
DRSG PAD ABDOMINAL 8X10 ST (GAUZE/BANDAGES/DRESSINGS) ×4 IMPLANT
ELECT COATED BLADE 2.86 ST (ELECTRODE) ×2 IMPLANT
ELECT REM PT RETURN 9FT ADLT (ELECTROSURGICAL) ×2
ELECTRODE REM PT RTRN 9FT ADLT (ELECTROSURGICAL) ×1 IMPLANT
EVACUATOR SILICONE 100CC (DRAIN) ×4 IMPLANT
GLOVE BIO SURGEON STRL SZ 6 (GLOVE) ×4 IMPLANT
GLOVE BIO SURGEON STRL SZ7 (GLOVE) ×4 IMPLANT
GLOVE BIOGEL PI IND STRL 7.0 (GLOVE) ×1 IMPLANT
GLOVE BIOGEL PI IND STRL 7.5 (GLOVE) ×2 IMPLANT
GLOVE BIOGEL PI INDICATOR 7.0 (GLOVE) ×1
GLOVE BIOGEL PI INDICATOR 7.5 (GLOVE) ×2
GLOVE SURG SYN 7.5  E (GLOVE) ×1
GLOVE SURG SYN 7.5 E (GLOVE) ×1 IMPLANT
GOWN STRL REUS W/ TWL LRG LVL3 (GOWN DISPOSABLE) ×2 IMPLANT
GOWN STRL REUS W/ TWL XL LVL3 (GOWN DISPOSABLE) ×2 IMPLANT
GOWN STRL REUS W/TWL LRG LVL3 (GOWN DISPOSABLE) ×2
GOWN STRL REUS W/TWL XL LVL3 (GOWN DISPOSABLE) ×2
MARKER SKIN DUAL TIP RULER LAB (MISCELLANEOUS) IMPLANT
NEEDLE HYPO 25X1 1.5 SAFETY (NEEDLE) ×2 IMPLANT
NS IRRIG 1000ML POUR BTL (IV SOLUTION) ×2 IMPLANT
PACK BASIN DAY SURGERY FS (CUSTOM PROCEDURE TRAY) ×2 IMPLANT
PENCIL BUTTON HOLSTER BLD 10FT (ELECTRODE) ×2 IMPLANT
PIN SAFETY STERILE (MISCELLANEOUS) ×2 IMPLANT
SHEET MEDIUM DRAPE 40X70 STRL (DRAPES) ×2 IMPLANT
SLEEVE SCD COMPRESS KNEE MED (MISCELLANEOUS) ×2 IMPLANT
SLEEVE SURGEON STRL (DRAPES) ×2 IMPLANT
SPONGE LAP 18X18 RF (DISPOSABLE) ×6 IMPLANT
STAPLER VISISTAT 35W (STAPLE) ×4 IMPLANT
SUT ETHILON 2 0 FS 18 (SUTURE) ×4 IMPLANT
SUT MNCRL AB 4-0 PS2 18 (SUTURE) ×8 IMPLANT
SUT PDS AB 2-0 CT2 27 (SUTURE) IMPLANT
SUT VIC AB 3-0 PS1 18 (SUTURE) ×7
SUT VIC AB 3-0 PS1 18XBRD (SUTURE) ×7 IMPLANT
SUT VICRYL 4-0 PS2 18IN ABS (SUTURE) ×4 IMPLANT
SYR BULB IRRIGATION 50ML (SYRINGE) ×2 IMPLANT
SYR CONTROL 10ML LL (SYRINGE) ×2 IMPLANT
TAPE MEASURE VINYL STERILE (MISCELLANEOUS) IMPLANT
TOWEL GREEN STERILE FF (TOWEL DISPOSABLE) ×4 IMPLANT
TRAY FOLEY W/BAG SLVR 14FR LF (SET/KITS/TRAYS/PACK) IMPLANT
TUBE CONNECTING 20X1/4 (TUBING) ×2 IMPLANT
UNDERPAD 30X30 (UNDERPADS AND DIAPERS) ×4 IMPLANT
YANKAUER SUCT BULB TIP NO VENT (SUCTIONS) ×2 IMPLANT

## 2018-07-24 NOTE — Op Note (Signed)
Operative Note   DATE OF OPERATION: 11.22.2019  [facility]  SURGICAL DIVISION: Plastic Surgery  PREOPERATIVE DIAGNOSES:  1. Macromastia 2. Chronic neck and back pain  POSTOPERATIVE DIAGNOSES:  same  PROCEDURE:  Bilateral breast reduction  SURGEON: Glenna FellowsBrinda Rechy Bost MD [facility]  ASSISTANT: none  ANESTHESIA:  General.   EBL: 75 ml  COMPLICATIONS: None immediate.   INDICATIONS FOR PROCEDURE:  The patient, Sabrina Chapman, is a 47 y.o. female born on 05/26/1971, is here for breast reduction for treatment chronic neck and back pain in setting macromastia, unrelieved by conservative measures.   FINDINGS: Right reduction 304 g Left 312 g  DESCRIPTION OF PROCEDURE:  The patient was marked standing in the preoperative area to mark sternal notch, chest midline, anterior axillary lines, inframammary folds. The location of new nipple areolar complex was marked at level of on inframammary fold on anterior surface breast by palpation. This was marked symmetric over bilateral breasts. With aid of Wise pattern marker, location of new nipple areolar complex and vertical limbs (7.5cm) were markedby displacement of breasts along meridian.The patient was taken to the operating room. SCDs were placedand IV antibiotics were given. The patient's operative site was prepped and draped in a sterile fashion. A time out was performed and all information was confirmed to be correct.   Over left breast, superomedial pedicle marked and nipple areolar complex marked with4942mm diameter marker. Pedicle deepithlialized and developedto chest wall. Breast tissue resected over lower pole. Medial and lateral flaps developed.Additional superior pole and lateral chest wall tissue excised.Breast tailor tacked closed.  I then directed attention to right breast where superomedial pedicle designed. The pedicle was deepithelialized. Pedicle developed until tension free rotation was possible.Breast  tissue resected over lower pole. Medial and lateral flaps developed.Additional superior pole and lateral chest wall tissue excised. Breast tailor tacked closed, and patient brought to upright sitting position and assessed for symmetry. Patent returned to supine position and breast cavities irrigated and hemostasis obtained. Exparel infiltrated throughout each breast. 15 Fr JP placed in each breast and secured with 2-0 nylon. Closure completed bilateralwith 3-0 vicryl to approximate dermis along inframammary fold and vertical limb. NAC inset with 4-0 vicryl in dermis. Skin closure completed with 4-0 monocryl subcuticular throughout.Tissue adhesive applied. Dry dressing and breast binder applied.  The patient was allowed to wake from anesthesia, extubated and taken to the recovery room in satisfactory condition.   SPECIMENS: right and left breast redution  DRAINS: 15 Fr JP in right and left breast  Glenna FellowsBrinda Ashly Goethe, MD [facility] Re[facility] [phone], pin [phone]

## 2018-07-24 NOTE — Discharge Instructions (Signed)

## 2018-07-24 NOTE — Anesthesia Postprocedure Evaluation (Signed)
Anesthesia Post Note  Patient: Sabrina Chapman  Procedure(s) Performed: BILATERAL MAMMARY REDUCTION  (BREAST) (Bilateral Breast)     Patient location during evaluation: PACU Anesthesia Type: General Level of consciousness: awake and alert Pain management: pain level controlled Vital Signs Assessment: post-procedure vital signs reviewed and stable Respiratory status: spontaneous breathing, nonlabored ventilation, respiratory function stable and patient connected to nasal cannula oxygen Cardiovascular status: blood pressure returned to baseline and stable Postop Assessment: no apparent nausea or vomiting Anesthetic complications: no    Last Vitals:  Vitals:   07/24/18 1415 07/24/18 1430  BP: 132/86 (!) 115/92  Pulse: 82 86  Resp: 18 16  Temp:  37.3 C  SpO2: 100% 98%    Last Pain:  Vitals:   07/24/18 1430  TempSrc:   PainSc: 0-No pain                 Abryanna Musolino

## 2018-07-24 NOTE — Interval H&P Note (Signed)
History and Physical Interval Note:  07/24/2018 9:37 AM  Sabrina Chapman  has presented today for surgery, with the diagnosis of macromastia, chronic neck and back pain  The various methods of treatment have been discussed with the patient and family. After consideration of risks, benefits and other options for treatment, the patient has consented to  Procedure(s): BILATERAL MAMMARY REDUCTION  (BREAST) (Bilateral) as a surgical intervention .  The patient's history has been reviewed, patient examined, no change in status, stable for surgery.  I have reviewed the patient's chart and labs.  Questions were answered to the patient's satisfaction.     [name]

## 2018-07-24 NOTE — Anesthesia Procedure Notes (Signed)
Procedure Name: Intubation Date/Time: 07/24/2018 10:26 AM Performed by: Cleda ClarksBrowder, Paiton Fosco R, CRNA Pre-anesthesia Checklist: Patient identified, Emergency Drugs available, Suction available and Patient being monitored Patient Re-evaluated:Patient Re-evaluated prior to induction Oxygen Delivery Method: Circle system utilized Preoxygenation: Pre-oxygenation with 100% oxygen Induction Type: IV induction Ventilation: Mask ventilation without difficulty Laryngoscope Size: Miller and 2 Grade View: Grade I Tube type: Oral Tube size: 7.0 mm Number of attempts: 1 Airway Equipment and Method: Stylet Placement Confirmation: ETT inserted through vocal cords under direct vision,  positive ETCO2 and breath sounds checked- equal and bilateral Tube secured with: Tape Dental Injury: Teeth and Oropharynx as per pre-operative assessment

## 2018-07-24 NOTE — Transfer of Care (Signed)
Immediate Anesthesia Transfer of Care Note  Patient: Sabrina Chapman  Procedure(s) Performed: BILATERAL MAMMARY REDUCTION  (BREAST) (Bilateral Breast)  Patient Location: PACU  Anesthesia Type:General  Level of Consciousness: awake, alert  and oriented  Airway & Oxygen Therapy: Patient Spontanous Breathing and Patient connected to face mask oxygen  Post-op Assessment: Report given to RN and Post -op Vital signs reviewed and stable  Post vital signs: Reviewed and stable  Last Vitals:  Vitals Value Taken Time  BP 133/87 07/24/2018  1:18 PM  Temp    Pulse 98 07/24/2018  1:21 PM  Resp 17 07/24/2018  1:21 PM  SpO2 100 % 07/24/2018  1:21 PM  Vitals shown include unvalidated device data.  Last Pain:  Vitals:   07/24/18 0943  TempSrc: Oral  PainSc:          Complications: No apparent anesthesia complications

## 2018-07-24 NOTE — Anesthesia Preprocedure Evaluation (Addendum)
Anesthesia Evaluation  Patient identified by MRN, date of birth, ID band Patient awake    Reviewed: Allergy & Precautions, NPO status , Patient's Chart, lab work & pertinent test results  History of Anesthesia Complications Negative for: history of anesthetic complications  Airway Mallampati: II  TM Distance: >3 FB Neck ROM: Full    Dental no notable dental hx. (+) Dental Advisory Given   Pulmonary neg pulmonary ROS, former smoker,    Pulmonary exam normal        Cardiovascular negative cardio ROS Normal cardiovascular exam     Neuro/Psych negative neurological ROS     GI/Hepatic negative GI ROS, Neg liver ROS,   Endo/Other  negative endocrine ROS  Renal/GU negative Renal ROS     Musculoskeletal negative musculoskeletal ROS (+)   Abdominal   Peds  Hematology negative hematology ROS (+)   Anesthesia Other Findings Day of surgery medications reviewed with the patient.  Reproductive/Obstetrics                            Anesthesia Physical Anesthesia Plan  ASA: I  Anesthesia Plan: General   Post-op Pain Management:    Induction: Intravenous  PONV Risk Score and Plan: 4 or greater and Ondansetron, Dexamethasone, Scopolamine patch - Pre-op and Diphenhydramine  Airway Management Planned: Oral ETT  Additional Equipment:   Intra-op Plan:   Post-operative Plan: Extubation in OR  Informed Consent: I have reviewed the patients History and Physical, chart, labs and discussed the procedure including the risks, benefits and alternatives for the proposed anesthesia with the patient or authorized representative who has indicated his/her understanding and acceptance.   Dental advisory given  Plan Discussed with: CRNA and Anesthesiologist  Anesthesia Plan Comments:        Anesthesia Quick Evaluation

## 2018-07-27 ENCOUNTER — Encounter (HOSPITAL_BASED_OUTPATIENT_CLINIC_OR_DEPARTMENT_OTHER): Payer: Self-pay | Admitting: Plastic Surgery

## 2018-08-05 ENCOUNTER — Encounter: Payer: Self-pay | Admitting: Family Medicine

## 2018-10-14 ENCOUNTER — Other Ambulatory Visit: Payer: Self-pay | Admitting: Obstetrics and Gynecology

## 2018-10-14 DIAGNOSIS — R5381 Other malaise: Secondary | ICD-10-CM

## 2018-10-14 DIAGNOSIS — Z1231 Encounter for screening mammogram for malignant neoplasm of breast: Secondary | ICD-10-CM

## 2018-10-28 ENCOUNTER — Ambulatory Visit
Admission: RE | Admit: 2018-10-28 | Discharge: 2018-10-28 | Disposition: A | Discharge status: Home / Self Care | Payer: 59 | Source: Ambulatory Visit | Attending: Obstetrics and Gynecology | Admitting: Obstetrics and Gynecology

## 2018-10-28 DIAGNOSIS — Z1231 Encounter for screening mammogram for malignant neoplasm of breast: Secondary | ICD-10-CM

## 2018-11-01 DIAGNOSIS — F32A Depression, unspecified: Secondary | ICD-10-CM

## 2018-11-01 HISTORY — DX: Depression, unspecified: F32.A

## 2018-12-01 ENCOUNTER — Encounter: Payer: Self-pay | Admitting: Family Medicine

## 2018-12-02 MED ORDER — ALPRAZOLAM 0.25 MG PO TABS: 0.2500 mg | ORAL_TABLET | Freq: Three times a day (TID) | ORAL | 0 refills | Status: DC | PRN

## 2019-02-13 NOTE — Patient Instructions (Addendum)
  HEALTH MAINTENANCE RECOMMENDATIONS:  It is recommended that you get at least 30 minutes of aerobic exercise at least 5 days/week (for weight loss, you may need as much as 60-90 minutes). This can be any activity that gets your heart rate up. This can be divided in 10-15 minute intervals if needed, but try and build up your endurance at least once a week.  Weight bearing exercise is also recommended twice weekly.  Eat a healthy diet with lots of vegetables, fruits and fiber.  "Colorful" foods have a lot of vitamins (ie green vegetables, tomatoes, red peppers, etc).  Limit sweet tea, regular sodas and alcoholic beverages, all of which has a lot of calories and sugar.  Up to 1 alcoholic drink daily may be beneficial for women (unless trying to lose weight, watch sugars).  Drink a lot of water.  Calcium recommendations are 1200-1500 mg daily (1500 mg for postmenopausal women or women without ovaries), and vitamin D 1000 IU daily.  This should be obtained from diet and/or supplements (vitamins), and calcium should not be taken all at once, but in divided doses.  Monthly self breast exams and yearly mammograms for women over the age of 58 is recommended.  Sunscreen of at least SPF 30 should be used on all sun-exposed parts of the skin when outside between the hours of 10 am and 4 pm (not just when at beach or pool, but even with exercise, golf, tennis, and yard work!)  Use a sunscreen that says "broad spectrum" so it covers both UVA and UVB rays, and make sure to reapply every 1-2 hours.  Remember to change the batteries in your smoke detectors when changing your clock times in the spring and fall. Carbon monoxide detectors are recommended for your home.  Use your seat belt every time you are in a car, and please drive safely and not be distracted with cell phones and texting while driving.  Be sure that you are drinking enough water during the day ("hunger" may actually be thirst). Also, increase your  protein intake with your evening meal, vs a snack like Greek yogurt in the evening).  Mention your spotting to your GYN so that she is aware.

## 2019-02-13 NOTE — Progress Notes (Signed)
Chief Complaint  Patient presents with  . Annual Exam    nonfasting annual exam no GYN-sees Dr.Horvath and is UTD. No concerns. Taking fluoxetine, progesterone and estrogen and is unsure of dosage and will call back with dosage.     Sabrina Chapman is a 48 y.o. female who presents for a complete physical.  She has the following concerns:  She was started oral estrogen and progesterone, started at her visit in 10/2018 for menopausal symptoms.  (had 2 periods since 01/2017, none in the last year, and reports she had labs confirming postmenopausal status). She sees Dr. Philis Pique. Hot flashes and night sweats are gone. She had some breast tenderness starting the last few days, now starting to improve.  She also is having some dark spotting.  She is s/p reduction mammoplasty. Done 07/24/2018.  Tissue pathology was benign. She has done well. She feels so much better, no longer has any pain. She had breast biopsy x 2 in R breast 10/2017.  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  F/u MRI was recommended of bilateral breasts in 6 months.  This was not done (cost was an issue?). Mammogram 10/2018 was normal, and has discussed with Dr. Philis Pique at visit. She will double check with her about whether or not she still needs MRI.  Anxiety/adjustment disorder--a couple of years ago she had trouble coping, sleeping related to her husband's stroke, and his inability to care for their children. She was prescribed alprazolam for prn use, only used sparingly.  Her husband improved, is back to work.  Last year she reported he had some remaining some subtle issues, sensory, hearing. At her visit last year, main stressor was behavioral issues with her daughter, Sabrina Chapman (hyper, misbehaving in school, some violent behavior towards classmates).  She was in kindergarten this past year (at [employer]). She did okay.  She is being assessed for  autism.  She has been resistant to treatments/medications (very hard to get her to take them). Sabrina Chapman was started on Prozac by her GYN and is doing much better.  Started about 6 weeks ago.  Feels less overwhelmed. Denies side effects.  Alprazolam last refilled 12/01/2018, needed related to increased stressors from pandemic--working from home and homeschooling 2 kids, husband also working full time from home. 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  Hasn't needed any since starting the Prozac.  Allergies: She takes claritin daily.  Admits to forgetting the Flonase frequently (forgets about every 3rd day.  No further vertigo since flare in December.  Acne: Previously prescribed Retin-A for acne on face and neck.Thinks it was hormonal, no longer is having any problems with acne.  Vitamin D deficiency: Level was low at 24 in the past. Treated with 12 weeks of prescription therapy. At point level was 85 (?on 5000 IU daily). She reports she cut back on the dose to 1000 IU daily.  Repeat level last year was 59.8 in 01/2018. She continues to take 1000 IU daily.  Weight gain--was 130-135 in 09/2018.  Started working with trainer, admits some increased/stress eating due to pandemic. Doing more takeout has contributed. Generally tries to eat healthy; husband cooks [employer] meals a few times/week. Also relates some of her weight gain to her depression. She recently noted that she is waking up at 4am hungry, questioning if that could be related to the hormones, medications.  Immunization History  Administered Date(s) Administered  . Influenza Split 07/17/2012  .  Influenza,inj,Quad PF,6+ Mos 09/15/2013, 07/05/2014, 05/02/2016, 08/19/2018  . Influenza-Unspecified 10/14/2017  . PPD Test 11/25/2012  . Tdap 07/16/2012   Last Pap smear: 10/2018 per pt (unsure when actual pap was done, but had pelvic with Dr. Henderson CloudHorvath. (per notes, no pap done this year, not due) Last mammogram: 10/2018 Last colonoscopy:  never  Last DEXA: never Dentist: Twice yearly Ophtho: yearly (has contacts, rarely wears) Exercise: Had a trainer 2x/week before COVID (Jan through mid-March).  Walks (and runs after kids) a lot, every other day, with kids. Carries kids some. Lipids: Lab Results  Component Value Date   CHOL 219 (H) 01/23/2017   HDL 83 01/23/2017   LDLCALC 128 (H) 01/23/2017   TRIG 40 01/23/2017   CHOLHDL 2.6 01/23/2017   Past Medical History:  Diagnosis Date  . Anxiety in her early 20's   took Paxil  . Dental crown present   . Depression 11/2018   started on Prozac by GYN  . IBS (irritable bowel syndrome)    under control, per pt.  . Macromastia 07/2018    Past Surgical History:  Procedure Laterality Date  . BILATERAL SALPINGECTOMY  09/23/2014  . BREAST REDUCTION SURGERY Bilateral 07/24/2018   Procedure: BILATERAL MAMMARY REDUCTION  (BREAST);  Surgeon: Glenna Fellowshimmappa, Brinda, MD;  Location: [facility];  Service: Plastics;  Laterality: Bilateral;  . BUNIONECTOMY Right 01/28/2017  . CESAREAN SECTION  07/15/2012   Procedure: CESAREAN SECTION;  Surgeon: Loney LaurenceMichelle A Horvath, MD;  Location: [facility];  Service: Obstetrics;  Laterality: N/A;  . CESAREAN SECTION N/A 09/23/2014   Procedure: CESAREAN SECTION;  Surgeon: Loney LaurenceMichelle A Horvath, MD;  Location: [facility];  Service: Obstetrics;  Laterality: N/A;  . DILATION AND CURETTAGE OF UTERUS  2008  . NASAL SINUS SURGERY     as a child    Social History   Socioeconomic History  . Marital status: Married    Spouse name: Not on file  . Number of children: 2  . Years of education: Not on file  . Highest education level: Not on file  Occupational History  . Occupation: Interior and spatial designerDirector at [employer]  Social Needs  . Financial resource strain: Not on file  . Food insecurity    Worry: Not on file    Inability: Not on file  . Transportation needs    Medical: Not on file    Non-medical: Not on file  Tobacco Use  . Smoking status: Former Smoker     Quit date: 09/01/1997    Years since quitting: 21.4  . Smokeless tobacco: Never Used  Substance and Sexual Activity  . Alcohol use: Yes    Comment: 1 glass wine every 2 days (4/week)  . Drug use: No  . Sexual activity: Yes    Partners: Male    Birth control/protection: Surgical  Lifestyle  . Physical activity    Days per week: Not on file    Minutes per session: Not on file  . Stress: Not on file  Relationships  . Social Musicianconnections    Talks on phone: Not on file    Gets together: Not on file    Attends religious service: Not on file    Active member of club or organization: Not on file    Attends meetings of clubs or organizations: Not on file    Relationship status: Not on file  . Intimate partner violence    Fear of current or ex partner: Not on file    Emotionally abused: Not on  file    Physically abused: Not on file    Forced sexual activity: Not on file  Other Topics Concern  . Not on file  Social History Narrative   Married 2 children.  Daughter Lauris Poag([name]) is having behavioral issues, being evaluated for Autism.   Librarian, academicxecutive director for [employer]    Family History  Problem Relation Age of Onset  . Cancer Mother        breast @62 , ovarian @65   . Hyperlipidemia Mother   . Heart disease Mother [id number]       by pass surgery  . Breast cancer Mother [phone]  . Ovarian cancer Mother [id number]       metastatic to abd/omentum (diagnosed after finding pleural effusions)  . Hyperlipidemia Father   . Allergies Father   . Depression Father   . Cancer Maternal Grandmother 50       breast cancer  . Heart disease Maternal Grandfather   . Depression Paternal Grandmother     Outpatient Encounter Medications as of 02/15/2019  Medication Sig Note  . b complex vitamins tablet Take 1 tablet by mouth daily.   . cholecalciferol (VITAMIN D3) 25 MCG (1000 UT) tablet Take 1,000 Units by mouth daily.   . fluticasone (FLONASE) 50 MCG/ACT nasal spray Place 1 spray into both nostrils daily.  02/15/2019: Admits to missing a couple of doses a week  . L-THEANINE PO Take 200 mg by mouth daily.    Marland Kitchen. loratadine (CLARITIN) 10 MG tablet Take 10 mg by mouth daily.   . Omega-3 Fatty Acids (FISH OIL PO) Take 1,200 mg by mouth daily.    Marland Kitchen. ALPRAZolam (XANAX) 0.25 MG tablet Take 1-2 tablets (0.25-0.5 mg total) by mouth 3 (three) times daily as needed for anxiety or sleep. (Patient not taking: Reported on 02/15/2019)   . [DISCONTINUED] S-Adenosylmethionine (SAM-E PO) Take 1,200 mg by mouth daily.     No facility-administered encounter medications on file as of 02/15/2019.     Allergies  Allergen Reactions  . Citric Acid Other (See Comments)    GI UPSET    ROS: The patient denies anorexia, fever, headaches, vision changes, decreased hearing, ear pain, sore throat, breast concerns (tender x 2 days; denies masses, discharge), chest pain, palpitations, syncope, dyspnea on exertion, cough, swelling, nausea, vomiting, diarrhea, constipation, abdominal pain, melena, hematochezia, indigestion/heartburn, hematuria, incontinence, dysuria, vaginal discharge, odor or itch, genital lesions, joint pains, numbness, tingling, weakness, tremor, suspicious skin lesions, depression, anxiety, abnormal bleeding/bruising, or enlarged lymph nodes.  Allergies controlled. Acne resolved. Upper back pain resolved s/p breast reduction surgery. No periods in the last year, and menopausal symptoms resolved with HRT. Weight gain per HPI. Breast tenderness and spotting x a few days, currently.   PHYSICAL EXAM:  BP 118/76   Pulse 72   Ht 5\' 7"  (1.702 m)   Wt 137 lb (62.1 kg)   BMI 21.46 kg/m   Wt Readings from Last 3 Encounters:  02/15/19 137 lb (62.1 kg)  07/24/18 123 lb 14.4 oz (56.2 kg)  02/09/18 127 lb 12.8 oz (58 kg)    General Appearance:  Alert, cooperative, no distress, appears stated age  Head:  Normocephalic, without obvious abnormality, atraumatic  Eyes:  PERRL, conjunctiva/corneas clear, EOM's  intact, fundi benign  Ears:  Normal TM's and external ear canals  Nose: Not examined (wearing mask due to COVID-19 pandemic)  Throat: Not examined (wearing mask due to COVID-19 pandemic)  Neck: Supple, no lymphadenopathy; thyroid: no enlargement/ tenderness/nodules; no carotid bruit or  JVD  Back:  Spine nontender, no curvature, ROM normal, no CVA tenderness  Lungs:  Clear to auscultation bilaterally without wheezes, ralesorronchi; respirations unlabored  Chest Wall:  No tenderness or deformity  Heart:  Regular rate and rhythm, S1 and S2 normal, no murmur, rub or gallop  Breast Exam:  Deferred to GYN  Abdomen:  Soft, non-tender, nondistended, normoactive bowel sounds,no masses, no hepatosplenomegaly  Genitalia:  Deferred to GYN     Extremities: No clubbing, cyanosis or edema. WHSS right foot.  Pulses: 2+ and symmetric all extremities  Skin: Skin color, texture, turgor normal  Lymph nodes: Cervical, supraclavicular, and axillary nodes normal  Neurologic: CNII-XII intact, normal strength, sensation and gait; reflexes 2+ and symmetric throughout   Psych: Normal mood, affect, hygiene and grooming   ASSESSMENT/PLAN:  Annual physical exam - Plan: TSH, CBC with Differential/Platelet, Comprehensive metabolic panel  Allergic rhinitis, unspecified seasonality, unspecified trigger - well controlled; counseled re: daily use of Flonase (and claritin can be prn, if able, during seasons where is milder)   Weight gain - still at a healthy weight, poss some muscle mass increased, due to strength training. Reassured. Healthy diet reviewed - Plan: TSH  Depression, major, single episode, mild (HCC) - Doing well on Prozac - Plan: TSH  Postmenopausal bleeding - likely related to newly started HRT; DDx reviewed. Encouraged her to touch base with GYN so she is aware  Also to touch base re: previously recommended MRI  (breast)  Discussed monthly self breast exams and yearly mammograms; at least 30 minutes of aerobic activity at least 5 days/week, weight-bearing exercise 2-3x/wk; proper sunscreen use reviewed; healthy diet, including goals of calcium and vitamin D intake and alcohol recommendations (less than or equal to 1 drink/day) reviewed; regular seatbelt use; changing batteries in smoke detectors. Immunization recommendations discussed--yearly flu shots recommended.Shingrix age 48.Colonoscopy recommendations reviewed, age 48, sooner if guidelines and insurance coverage changes.  F/u 1 year, sooner prn

## 2019-02-15 ENCOUNTER — Other Ambulatory Visit: Payer: Self-pay

## 2019-02-15 ENCOUNTER — Encounter: Payer: Self-pay | Admitting: Family Medicine

## 2019-02-15 ENCOUNTER — Ambulatory Visit (INDEPENDENT_AMBULATORY_CARE_PROVIDER_SITE_OTHER): Payer: Managed Care, Other (non HMO) | Admitting: Family Medicine

## 2019-02-15 VITALS — BP 118/76 | HR 72 | Ht 67.0 in | Wt 137.0 lb

## 2019-02-15 DIAGNOSIS — J309 Allergic rhinitis, unspecified: Secondary | ICD-10-CM | POA: Diagnosis not present

## 2019-02-15 DIAGNOSIS — Z0000 Encounter for general adult medical examination without abnormal findings: Secondary | ICD-10-CM | POA: Diagnosis not present

## 2019-02-15 DIAGNOSIS — R635 Abnormal weight gain: Secondary | ICD-10-CM

## 2019-02-15 DIAGNOSIS — F320 Major depressive disorder, single episode, mild: Secondary | ICD-10-CM

## 2019-02-15 DIAGNOSIS — N950 Postmenopausal bleeding: Secondary | ICD-10-CM

## 2019-02-16 ENCOUNTER — Encounter: Payer: Self-pay | Admitting: Family Medicine

## 2019-02-16 LAB — CBC WITH DIFFERENTIAL/PLATELET
Basophils Absolute: 0 10*3/uL (ref 0.0–0.2)
Basos: 1 %
EOS (ABSOLUTE): 0.1 10*3/uL (ref 0.0–0.4)
Eos: 3 %
Hematocrit: 37.4 % (ref 34.0–46.6)
Hemoglobin: 12.9 g/dL (ref 11.1–15.9)
Immature Grans (Abs): 0 10*3/uL (ref 0.0–0.1)
Immature Granulocytes: 0 %
Lymphocytes Absolute: 1.2 10*3/uL (ref 0.7–3.1)
Lymphs: 29 %
MCH: 32.1 pg (ref 26.6–33.0)
MCHC: 34.5 g/dL (ref 31.5–35.7)
MCV: 93 fL (ref 79–97)
Monocytes Absolute: 0.5 10*3/uL (ref 0.1–0.9)
Monocytes: 13 %
Neutrophils Absolute: 2.2 10*3/uL (ref 1.4–7.0)
Neutrophils: 54 %
Platelets: 263 10*3/uL (ref 150–450)
RBC: 4.02 x10E6/uL (ref 3.77–5.28)
RDW: 12.1 % (ref 11.7–15.4)
WBC: 4 10*3/uL (ref 3.4–10.8)

## 2019-02-16 LAB — COMPREHENSIVE METABOLIC PANEL
ALT: 18 IU/L (ref 0–32)
AST: 29 IU/L (ref 0–40)
Albumin/Globulin Ratio: 1.8 (ref 1.2–2.2)
Albumin: 4.4 g/dL (ref 3.8–4.8)
Alkaline Phosphatase: 62 IU/L (ref 39–117)
BUN/Creatinine Ratio: 17 (ref 9–23)
BUN: 12 mg/dL (ref 6–24)
Bilirubin Total: 0.5 mg/dL (ref 0.0–1.2)
CO2: 22 mmol/L (ref 20–29)
Calcium: 8.9 mg/dL (ref 8.7–10.2)
Chloride: 103 mmol/L (ref 96–106)
Creatinine, Ser: 0.7 mg/dL (ref 0.57–1.00)
GFR calc Af Amer: 119 mL/min/{1.73_m2} (ref >59)
GFR calc non Af Amer: 104 mL/min/{1.73_m2} (ref >59)
Globulin, Total: 2.5 g/dL (ref 1.5–4.5)
Glucose: 82 mg/dL (ref 65–99)
Potassium: 4.7 mmol/L (ref 3.5–5.2)
Sodium: 139 mmol/L (ref 134–144)
Total Protein: 6.9 g/dL (ref 6.0–8.5)

## 2019-02-16 LAB — TSH: TSH: 0.867 u[IU]/mL (ref 0.450–4.500)

## 2019-03-11 ENCOUNTER — Other Ambulatory Visit: Payer: Self-pay | Admitting: Obstetrics and Gynecology

## 2019-03-11 DIAGNOSIS — R928 Other abnormal and inconclusive findings on diagnostic imaging of breast: Secondary | ICD-10-CM

## 2019-03-31 ENCOUNTER — Ambulatory Visit (INDEPENDENT_AMBULATORY_CARE_PROVIDER_SITE_OTHER): Payer: Managed Care, Other (non HMO)

## 2019-03-31 ENCOUNTER — Ambulatory Visit: Payer: Managed Care, Other (non HMO) | Admitting: Podiatry

## 2019-03-31 ENCOUNTER — Encounter: Payer: Self-pay | Admitting: Podiatry

## 2019-03-31 ENCOUNTER — Other Ambulatory Visit: Payer: Self-pay | Admitting: Podiatry

## 2019-03-31 ENCOUNTER — Other Ambulatory Visit: Payer: Self-pay

## 2019-03-31 VITALS — Temp 98.6°F

## 2019-03-31 DIAGNOSIS — S9031XA Contusion of right foot, initial encounter: Secondary | ICD-10-CM

## 2019-03-31 DIAGNOSIS — S92911A Unspecified fracture of right toe(s), initial encounter for closed fracture: Secondary | ICD-10-CM

## 2019-04-01 NOTE — Progress Notes (Signed)
Subjective:   Patient ID: Sabrina Chapman, female   DOB: 48 y.o.   MRN: [id number]   HPI Patient presents stating she fell off the top of a container several days ago and feels like she may have broken her toe it is been very tender and makes it hard to wear shoe gear   ROS      Objective:  Physical Exam  Neurovascular status intact with patient found to have a well-healing surgical site right first metatarsal with redness swelling of the distal joint of the right second digit with pain     Assessment:  Acute pathology of the right second toe with fracture of the digit possible     Plan:  H&P reviewed x-ray and discussed fracture toe.  I recommended elevation compression and wide shoe gear usage but it should heal uneventfully  X-ray indicates there is a fracture of the base of the distal phalanx right second toe medial side with no indications of displacement of bone

## 2019-04-12 ENCOUNTER — Other Ambulatory Visit: Payer: Self-pay

## 2019-04-12 ENCOUNTER — Ambulatory Visit
Admission: RE | Admit: 2019-04-12 | Discharge: 2019-04-12 | Disposition: A | Discharge status: Home / Self Care | Payer: 59 | Source: Ambulatory Visit | Attending: Obstetrics and Gynecology | Admitting: Obstetrics and Gynecology

## 2019-04-12 DIAGNOSIS — R928 Other abnormal and inconclusive findings on diagnostic imaging of breast: Secondary | ICD-10-CM

## 2019-04-12 MED ORDER — GADOBUTROL 1 MMOL/ML IV SOLN
6.0000 mL | Freq: Once | INTRAVENOUS | Status: AC | PRN
  Administered 2019-04-12: 10:00:00 6 mL via INTRAVENOUS

## 2019-04-14 ENCOUNTER — Other Ambulatory Visit: Payer: Self-pay

## 2019-04-14 DIAGNOSIS — Z20822 Contact with and (suspected) exposure to covid-19: Secondary | ICD-10-CM

## 2019-04-16 LAB — NOVEL CORONAVIRUS, NAA: SARS-CoV-2, NAA: NOT DETECTED

## 2019-06-01 ENCOUNTER — Other Ambulatory Visit: Payer: Self-pay | Admitting: Registered"

## 2019-06-01 DIAGNOSIS — Z20822 Contact with and (suspected) exposure to covid-19: Secondary | ICD-10-CM

## 2019-06-02 LAB — NOVEL CORONAVIRUS, NAA: SARS-CoV-2, NAA: NOT DETECTED

## 2019-07-28 ENCOUNTER — Other Ambulatory Visit: Payer: Self-pay

## 2019-07-28 DIAGNOSIS — Z20822 Contact with and (suspected) exposure to covid-19: Secondary | ICD-10-CM

## 2019-07-28 DIAGNOSIS — Z0289 Encounter for other administrative examinations: Secondary | ICD-10-CM

## 2019-07-29 LAB — NOVEL CORONAVIRUS, NAA: SARS-CoV-2, NAA: NOT DETECTED

## 2019-08-05 ENCOUNTER — Other Ambulatory Visit: Payer: Self-pay

## 2019-08-05 DIAGNOSIS — Z20822 Contact with and (suspected) exposure to covid-19: Secondary | ICD-10-CM

## 2019-08-07 LAB — NOVEL CORONAVIRUS, NAA: SARS-CoV-2, NAA: NOT DETECTED

## 2019-08-19 ENCOUNTER — Other Ambulatory Visit: Payer: Self-pay

## 2019-08-19 ENCOUNTER — Ambulatory Visit: Payer: 59 | Attending: Internal Medicine

## 2019-08-19 DIAGNOSIS — Z20822 Contact with and (suspected) exposure to covid-19: Secondary | ICD-10-CM

## 2019-08-21 LAB — NOVEL CORONAVIRUS, NAA: SARS-CoV-2, NAA: NOT DETECTED

## 2019-08-24 ENCOUNTER — Ambulatory Visit: Payer: 59 | Attending: Internal Medicine

## 2019-08-24 DIAGNOSIS — Z20822 Contact with and (suspected) exposure to covid-19: Secondary | ICD-10-CM

## 2019-08-26 LAB — NOVEL CORONAVIRUS, NAA: SARS-CoV-2, NAA: NOT DETECTED

## 2019-09-15 ENCOUNTER — Ambulatory Visit: Payer: 59 | Attending: Internal Medicine

## 2019-09-15 DIAGNOSIS — Z20822 Contact with and (suspected) exposure to covid-19: Secondary | ICD-10-CM

## 2019-09-16 LAB — NOVEL CORONAVIRUS, NAA: SARS-CoV-2, NAA: NOT DETECTED

## 2019-10-13 ENCOUNTER — Telehealth: Payer: Self-pay | Admitting: *Deleted

## 2019-10-13 DIAGNOSIS — Z0000 Encounter for general adult medical examination without abnormal findings: Secondary | ICD-10-CM

## 2019-10-13 NOTE — Telephone Encounter (Signed)
Got patient rescheduled for CPE 02/17/2020 and coming in for fasting labs 02/15/20 @ 8:30am-need orders please.

## 2019-10-15 NOTE — Telephone Encounter (Signed)
done

## 2019-11-01 ENCOUNTER — Other Ambulatory Visit: Payer: Self-pay | Admitting: Obstetrics and Gynecology

## 2019-11-01 DIAGNOSIS — Z1231 Encounter for screening mammogram for malignant neoplasm of breast: Secondary | ICD-10-CM

## 2019-11-01 DIAGNOSIS — Z803 Family history of malignant neoplasm of breast: Secondary | ICD-10-CM

## 2019-11-04 ENCOUNTER — Ambulatory Visit
Admission: RE | Admit: 2019-11-04 | Discharge: 2019-11-04 | Disposition: A | Discharge status: Home / Self Care | Payer: Managed Care, Other (non HMO) | Source: Ambulatory Visit | Attending: Obstetrics and Gynecology | Admitting: Obstetrics and Gynecology

## 2019-11-04 ENCOUNTER — Other Ambulatory Visit: Payer: Self-pay

## 2019-11-04 DIAGNOSIS — Z1231 Encounter for screening mammogram for malignant neoplasm of breast: Secondary | ICD-10-CM

## 2019-11-06 ENCOUNTER — Ambulatory Visit: Payer: Managed Care, Other (non HMO) | Attending: Internal Medicine

## 2019-11-06 DIAGNOSIS — Z23 Encounter for immunization: Secondary | ICD-10-CM | POA: Insufficient documentation

## 2019-11-06 NOTE — Progress Notes (Signed)
   Covid-19 Vaccination Clinic  Name:  JERA HEADINGS    MRN: [id number] DOB: 1971-04-15  11/06/2019  Ms. Stratford-Owens was observed post Covid-19 immunization for 15 minutes without incident. She was provided with Vaccine Information Sheet and instruction to access the V-Safe system.   Ms. Hendel was instructed to call 911 with any severe reactions post vaccine: Marland Kitchen Difficulty breathing  . Swelling of face and throat  . A fast heartbeat  . A bad rash all over body  . Dizziness and weakness   Immunizations Administered    Name Date Dose VIS Date Route   Pfizer COVID-19 Vaccine 11/06/2019  3:39 PM 0.3 mL 08/13/2019 Intramuscular   Manufacturer: [employer], [employer]   Lot: [id number]   NDC: [address]-9

## 2019-11-22 ENCOUNTER — Other Ambulatory Visit: Payer: 59

## 2019-11-27 ENCOUNTER — Ambulatory Visit: Payer: Managed Care, Other (non HMO) | Attending: Internal Medicine

## 2019-11-27 DIAGNOSIS — Z23 Encounter for immunization: Secondary | ICD-10-CM

## 2019-11-27 NOTE — Progress Notes (Signed)
   Covid-19 Vaccination Clinic  Name:  DASHIA CALDEIRA    MRN: [id number] DOB: Sep 24, 1970  11/27/2019  Ms. Stratford-Owens was observed post Covid-19 immunization for 15 minutes without incident. She was provided with Vaccine Information Sheet and instruction to access the V-Safe system.   Ms. Siebers was instructed to call 911 with any severe reactions post vaccine: Marland Kitchen Difficulty breathing  . Swelling of face and throat  . A fast heartbeat  . A bad rash all over body  . Dizziness and weakness   Immunizations Administered    Name Date Dose VIS Date Route   Pfizer COVID-19 Vaccine 11/27/2019  2:00 PM 0.3 mL 08/13/2019 Intramuscular   Manufacturer: [employer], [employer]   Lot: [id number]   NDC: [address]-1

## 2019-12-07 ENCOUNTER — Ambulatory Visit: Payer: Managed Care, Other (non HMO)

## 2019-12-17 ENCOUNTER — Other Ambulatory Visit: Payer: Self-pay

## 2019-12-17 ENCOUNTER — Ambulatory Visit
Admission: RE | Admit: 2019-12-17 | Discharge: 2019-12-17 | Disposition: A | Discharge status: Home / Self Care | Payer: Managed Care, Other (non HMO) | Source: Ambulatory Visit | Attending: Obstetrics and Gynecology | Admitting: Obstetrics and Gynecology

## 2019-12-17 DIAGNOSIS — Z803 Family history of malignant neoplasm of breast: Secondary | ICD-10-CM

## 2019-12-17 MED ORDER — GADOBUTROL 1 MMOL/ML IV SOLN
6.0000 mL | Freq: Once | INTRAVENOUS | Status: AC | PRN
  Administered 2019-12-17: 6 mL via INTRAVENOUS

## 2020-02-09 NOTE — Patient Instructions (Addendum)
Get your Covid test at [address] In [place] on Sat 02/19/20 at 9:30   Sabrina Chapman       Your procedure is scheduled on 02/23/20   Report to [facility]  at   8:55 A.M.   Call this number if you have problems the morning of surgery:[phone]   OUR ADDRESS IS [address], WE ARE [facility]ATED [facility] WITH [facility].    Do not eat food or drink liquids after midnight.   Take these medicines the morning of surgery with A SIP OF WATER: Prozac, Zyrtec, Estrace, Flonase if needed   Do not wear jewelry, make-up or nail polish.  Do not wear lotions, powders, or perfumes, or deoderant.  Do not shave 48 hours prior to surgery.    Do not bring valuables to the hospital.  [facility] is not responsible for any belongings or valuables.  Contacts, dentures or bridgework may not be worn into surgery.  Leave your suitcase in the car.  After surgery it may be brought to your room.  For patients admitted to the hospital, discharge time will be determined by your treatment team.  Patients discharged the day of surgery will not be allowed to drive home.   Special instructions:  Bring meds in their original bottles  Please read over the following fact sheets that you were given:       [facility] - Preparing for Surgery  Before surgery, you can play an important role.   Because skin is not sterile, your skin needs to be as free of germs as possible.   You can reduce the number of germs on your skin by washing with CHG (chlorahexidine gluconate) soap before surgery.   CHG is an antiseptic cleaner which kills germs and bonds with the skin to continue killing germs even after washing. Please DO NOT use if you have an allergy to CHG or antibacterial soaps.   If your skin becomes reddened/irritated stop using the CHG and inform your nurse when you arrive at Short Stay. Do not shave (including legs and underarms) for at least 48 hours  prior to the first CHG shower.   Please follow these instructions carefully:  1.  Shower with CHG Soap the night before surgery and the  morning of Surgery.  2.  If you choose to wash your hair, wash your hair first as usual with your  normal  shampoo.  3.  After you shampoo, rinse your hair and body thoroughly to remove the  shampoo.                                        4.  Use CHG as you would any other liquid soap.  You can apply chg directly  to the skin and wash                       Gently with a scrungie or clean washcloth.  5.  Apply the CHG Soap to your body ONLY FROM THE NECK DOWN.   Do not use on face/ open                           Wound or open sores. Avoid contact with eyes, ears mouth and genitals (private parts).  Wash face,  Genitals (private parts) with your normal soap.             6.  Wash thoroughly, paying special attention to the area where your surgery  will be performed.  7.  Thoroughly rinse your body with warm water from the neck down.  8.  DO NOT shower/wash with your normal soap after using and rinsing off  the CHG Soap.             9.  Pat yourself dry with a clean towel.            10.  Wear clean pajamas.            11.  Place clean sheets on your bed the night of your first shower and do not  sleep with pets. Day of Surgery : Do not apply any lotions/deodorants the morning of surgery.  Please wear clean clothes to the hospital/surgery center.  FAILURE TO FOLLOW THESE INSTRUCTIONS MAY RESULT IN THE CANCELLATION OF YOUR SURGERY PATIENT SIGNATURE_________________________________  NURSE SIGNATURE__________________________________  ________________________________________________________________________

## 2020-02-10 ENCOUNTER — Encounter (HOSPITAL_COMMUNITY)
Admission: RE | Admit: 2020-02-10 | Discharge: 2020-02-10 | Disposition: A | Discharge status: Home / Self Care | Payer: Managed Care, Other (non HMO) | Source: Ambulatory Visit | Attending: Obstetrics and Gynecology | Admitting: Obstetrics and Gynecology

## 2020-02-10 ENCOUNTER — Other Ambulatory Visit: Payer: Self-pay

## 2020-02-10 ENCOUNTER — Encounter (HOSPITAL_COMMUNITY): Payer: Self-pay

## 2020-02-10 DIAGNOSIS — Z01812 Encounter for preprocedural laboratory examination: Secondary | ICD-10-CM | POA: Insufficient documentation

## 2020-02-10 LAB — CBC
HCT: 40.1 % (ref 36.0–46.0)
Hemoglobin: 13 g/dL (ref 12.0–15.0)
MCH: 31.9 pg (ref 26.0–34.0)
MCHC: 32.4 g/dL (ref 30.0–36.0)
MCV: 98.3 fL (ref 80.0–100.0)
Platelets: 258 10*3/uL (ref 150–400)
RBC: 4.08 MIL/uL (ref 3.87–5.11)
RDW: 12.5 % (ref 11.5–15.5)
WBC: 4.5 10*3/uL (ref 4.0–10.5)
nRBC: 0 % (ref 0.0–0.2)

## 2020-02-10 LAB — TYPE AND SCREEN
ABO/RH(D): B POS
Antibody Screen: NEGATIVE

## 2020-02-10 LAB — ABO/RH: ABO/RH(D): B POS

## 2020-02-10 NOTE — Progress Notes (Signed)
COVID Vaccine Completed:Yes Date COVID Vaccine completed:11/27/19 COVID vaccine manufacturer: Pfizer     PCP - Dr. Benedetto Coons Cardiologist - no  Chest x-ray - no EKG - no Stress Test - no ECHO - no Cardiac Cath - no  Sleep Study - no CPAP -   Fasting Blood Sugar - NA Checks Blood Sugar _____ times a day  Blood Thinner Instructions:NA Aspirin Instructions: Last Dose:  Anesthesia review:   Patient denies shortness of breath, fever, cough and chest pain at PAT appointment yes  Patient verbalized understanding of instructions that were given to them at the PAT appointment. Patient was also instructed that they will need to review over the PAT instructions again at home before surgery. Yes

## 2020-02-11 ENCOUNTER — Other Ambulatory Visit: Payer: Self-pay | Admitting: Obstetrics and Gynecology

## 2020-02-11 MED ORDER — FAMOTIDINE 10 MG PO TABS: 20.0000 mg | ORAL_TABLET | Freq: Every day | ORAL | Status: AC

## 2020-02-11 NOTE — H&P (Signed)
49 y.o. [id number] with stress incontinence and mild cystocele.  Uterus does have some decensus but the main reason pt wants TLH/BO is because of her family histrory of ovarian cancer.  Pt desires definitive.    [place] 6x3x3; EM 71mm. Ovaries normal size, no cysts, no ff.  CA 125 was 18  Past Medical History:  Diagnosis Date  . Anxiety in her early 20's   took Paxil  . Dental crown present   . Depression 11/2018   started on Prozac by GYN  . Macromastia 07/2018   Past Surgical History:  Procedure Laterality Date  . BILATERAL SALPINGECTOMY  09/23/2014  . BREAST REDUCTION SURGERY Bilateral 07/24/2018   Procedure: BILATERAL MAMMARY REDUCTION  (BREAST);  Surgeon: Irene Limbo, MD;  Location: [facility];  Service: Plastics;  Laterality: Bilateral;  . BUNIONECTOMY Right 01/28/2017  . CESAREAN SECTION  07/15/2012   Procedure: CESAREAN SECTION;  Surgeon: Daria Pastures, MD;  Location: [facility];  Service: Obstetrics;  Laterality: N/A;  . CESAREAN SECTION N/A 09/23/2014   Procedure: CESAREAN SECTION;  Surgeon: Daria Pastures, MD;  Location: [facility];  Service: Obstetrics;  Laterality: N/A;  . DILATION AND CURETTAGE OF UTERUS  2008  . NASAL SINUS SURGERY     as a child  . TUBAL LIGATION      Social History   Socioeconomic History  . Marital status: Married    Spouse name: Not on file  . Number of children: 2  . Years of education: Not on file  . Highest education level: Not on file  Occupational History  . Occupation: Mudlogger at [employer]  . Smoking status: Former Smoker    Quit date: 09/01/1997    Years since quitting: 22.4  . Smokeless tobacco: Never Used  Vaping Use  . Vaping Use: Never used  Substance and Sexual Activity  . Alcohol use: Yes    Comment: 1 glass wine every 2 days (4/week)  . Drug use: No  . Sexual activity: Yes    Partners: Male    Birth control/protection: Surgical  Other Topics Concern  . Not on file  Social History  Narrative   Married 2 children.  Daughter Clyde Canterbury) is having behavioral issues, being evaluated for Autism.   Development worker, international aid for [employer]   Social Determinants of Radio broadcast assistant Strain:   . Difficulty of Paying Living Expenses:   Food Insecurity:   . Worried About Charity fundraiser in the Last Year:   . Arboriculturist in the Last Year:   Transportation Needs:   . Film/video editor (Medical):   Marland Kitchen Lack of Transportation (Non-Medical):   Physical Activity:   . Days of Exercise per Week:   . Minutes of Exercise per Session:   Stress:   . Feeling of Stress :   Social Connections:   . Frequency of Communication with Friends and Family:   . Frequency of Social Gatherings with Friends and Family:   . Attends Religious Services:   . Active Member of Clubs or Organizations:   . Attends Archivist Meetings:   Marland Kitchen Marital Status:   Intimate Partner Violence:   . Fear of Current or Ex-Partner:   . Emotionally Abused:   Marland Kitchen Physically Abused:   . Sexually Abused:     No current facility-administered medications on file prior to encounter.   Current Outpatient Medications on File Prior to Encounter  Medication Sig Dispense Refill  .  cetirizine (ZYRTEC) 10 MG tablet Take 10 mg by mouth daily.    . cholecalciferol (VITAMIN D3) 25 MCG (1000 UT) tablet Take 1,000 Units by mouth daily.    Marland Kitchen estradiol (ESTRACE) 0.5 MG tablet Take 0.5 mg by mouth daily.    Marland Kitchen FLUoxetine (PROZAC) 20 MG capsule Take 20 mg by mouth daily.    . fluticasone (FLONASE) 50 MCG/ACT nasal spray Place 1 spray into both nostrils daily as needed for allergies.     Marland Kitchen L-Theanine 200 MG CAPS Take 200 mg by mouth daily.     . progesterone (PROMETRIUM) 100 MG capsule Take 100 mg by mouth daily.    Marland Kitchen ALPRAZolam (XANAX) 0.25 MG tablet Take 1-2 tablets (0.25-0.5 mg total) by mouth 3 (three) times daily as needed for anxiety or sleep. (Patient not taking: Reported on 02/01/2020) 20 tablet 0     Allergies  Allergen Reactions  . Citric Acid Other (See Comments)    GI UPSET    There were no vitals filed for this visit.  Lungs: clear to ascultation Cor:  RRR Abdomen:  soft, nontender, nondistended. Ex:  no cords, erythema Pelvic:   Vulva: no masses, no atrophy, no lesions Vagina: no tenderness, no erythema, no abnormal vaginal discharge, no vesicle(s) or ulcers, no cystocele, no rectocele Cervix: grossly normal, no discharge, no cervical motion tenderness Uterus: normal size (7), normal shape, midline, no uterine prolapse, non-tender (very slight) Bladder/Urethra: no urethral discharge, no urethral mass, bladder non distended, Urethra hypermobile (45) Adnexa/Parametria: no parametrial tenderness, no parametrial mass, no adnexal tenderness, no ovarian mass   A:  For Robotic TLH/oopherectomies/TVT/a repair and cysto.   P: P: All risks, benefits and alternatives d/w patient and she desires to proceed.  Patient has undergone a modified diet, ERAS protocol and will receive preop antibiotics and SCDs during the operation.   Pt to have extended recovery but will go home same day if eating, ambulating, voiding and pain control is good.  Loney Laurence

## 2020-02-15 ENCOUNTER — Other Ambulatory Visit: Payer: Managed Care, Other (non HMO)

## 2020-02-15 ENCOUNTER — Other Ambulatory Visit: Payer: Self-pay

## 2020-02-15 DIAGNOSIS — Z0000 Encounter for general adult medical examination without abnormal findings: Secondary | ICD-10-CM

## 2020-02-16 LAB — CBC WITH DIFFERENTIAL/PLATELET
Basophils Absolute: 0 10*3/uL (ref 0.0–0.2)
Basos: 0 %
EOS (ABSOLUTE): 0.2 10*3/uL (ref 0.0–0.4)
Eos: 3 %
Hematocrit: 37.8 % (ref 34.0–46.6)
Hemoglobin: 12.8 g/dL (ref 11.1–15.9)
Immature Grans (Abs): 0 10*3/uL (ref 0.0–0.1)
Immature Granulocytes: 0 %
Lymphocytes Absolute: 0.7 10*3/uL (ref 0.7–3.1)
Lymphs: 12 %
MCH: 32.4 pg (ref 26.6–33.0)
MCHC: 33.9 g/dL (ref 31.5–35.7)
MCV: 96 fL (ref 79–97)
Monocytes Absolute: 0.5 10*3/uL (ref 0.1–0.9)
Monocytes: 9 %
Neutrophils Absolute: 4.3 10*3/uL (ref 1.4–7.0)
Neutrophils: 76 %
Platelets: 233 10*3/uL (ref 150–450)
RBC: 3.95 x10E6/uL (ref 3.77–5.28)
RDW: 12.1 % (ref 11.7–15.4)
WBC: 5.7 10*3/uL (ref 3.4–10.8)

## 2020-02-16 LAB — COMPREHENSIVE METABOLIC PANEL
ALT: 18 IU/L (ref 0–32)
AST: 23 IU/L (ref 0–40)
Albumin/Globulin Ratio: 1.9 (ref 1.2–2.2)
Albumin: 4.5 g/dL (ref 3.8–4.8)
Alkaline Phosphatase: 70 IU/L (ref 48–121)
BUN/Creatinine Ratio: 14 (ref 9–23)
BUN: 10 mg/dL (ref 6–24)
Bilirubin Total: 1 mg/dL (ref 0.0–1.2)
CO2: 23 mmol/L (ref 20–29)
Calcium: 9 mg/dL (ref 8.7–10.2)
Chloride: 104 mmol/L (ref 96–106)
Creatinine, Ser: 0.74 mg/dL (ref 0.57–1.00)
GFR calc Af Amer: 111 mL/min/{1.73_m2} (ref >59)
GFR calc non Af Amer: 96 mL/min/{1.73_m2} (ref >59)
Globulin, Total: 2.4 g/dL (ref 1.5–4.5)
Glucose: 93 mg/dL (ref 65–99)
Potassium: 4.6 mmol/L (ref 3.5–5.2)
Sodium: 140 mmol/L (ref 134–144)
Total Protein: 6.9 g/dL (ref 6.0–8.5)

## 2020-02-16 LAB — LIPID PANEL
Chol/HDL Ratio: 2.5 ratio (ref 0.0–4.4)
Cholesterol, Total: 226 mg/dL — ABNORMAL HIGH (ref 100–199)
HDL: 92 mg/dL (ref >39)
LDL Chol Calc (NIH): 123 mg/dL — ABNORMAL HIGH (ref 0–99)
Triglycerides: 66 mg/dL (ref 0–149)
VLDL Cholesterol Cal: 11 mg/dL (ref 5–40)

## 2020-02-16 NOTE — Patient Instructions (Signed)
  HEALTH MAINTENANCE RECOMMENDATIONS:  It is recommended that you get at least 30 minutes of aerobic exercise at least 5 days/week (for weight loss, you may need as much as 60-90 minutes). This can be any activity that gets your heart rate up. This can be divided in 10-15 minute intervals if needed, but try and build up your endurance at least once a week.  Weight bearing exercise is also recommended twice weekly.  Eat a healthy diet with lots of vegetables, fruits and fiber.  "Colorful" foods have a lot of vitamins (ie green vegetables, tomatoes, red peppers, etc).  Limit sweet tea, regular sodas and alcoholic beverages, all of which has a lot of calories and sugar.  Up to 1 alcoholic drink daily may be beneficial for women (unless trying to lose weight, watch sugars).  Drink a lot of water.  Calcium recommendations are 1200-1500 mg daily (1500 mg for postmenopausal women or women without ovaries), and vitamin D 1000 IU daily.  This should be obtained from diet and/or supplements (vitamins), and calcium should not be taken all at once, but in divided doses.  Monthly self breast exams and yearly mammograms for women over the age of 52 is recommended.  Sunscreen of at least SPF 30 should be used on all sun-exposed parts of the skin when outside between the hours of 10 am and 4 pm (not just when at beach or pool, but even with exercise, golf, tennis, and yard work!)  Use a sunscreen that says "broad spectrum" so it covers both UVA and UVB rays, and make sure to reapply every 1-2 hours.  Remember to change the batteries in your smoke detectors when changing your clock times in the spring and fall. Carbon monoxide detectors are recommended for your home.  Use your seat belt every time you are in a car, and please drive safely and not be distracted with cell phones and texting while driving.  Wishing you an easy recovery from your upcoming surgery.  Colon cancer screening is now recommended. We are  referring you to GI

## 2020-02-16 NOTE — Progress Notes (Signed)
Chief Complaint  Patient presents with  . Annual Exam    nonfasting annual exam, no pap. Is still taking prozac and wants to discuss.     Sabrina Chapman is a 49 y.o. female who presents for a complete physical.  She has the following concerns:  Son picked up a cold from school.  She started 4 days ago with congestion, yellow mucus, PND, sore throat.  No sinus pain/pressure.  Denies chest congestion.  It is improving a little. Using Mucinex. Feels like it is getting better.  She is scheduled next week for robotic laparoscopic total hysterectomy with BSO, TVT by Dr. Henderson Cloud. She has stress incontinence, mild cystocele, and family history of ovarian cancer.  She is postmenopausal.  She had been treated with HRT to manage menopausal symptoms, with resolution of night sweats and hot flashes.   She is s/p reduction mammoplasty. Done 07/24/2018.  Tissue pathology was benign. She no longer has any upper back pain. She had breast biopsy x 2 in R breast 10/2017.  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  F/u MRI was recommended of bilateral breasts in 6 months. She had normal mammogram in 11/2019, and normal breast MRI in 12/2019.  Anxiety/adjustment disorder--a few years ago she had trouble coping, sleeping related to her husband's stroke, and his inability to care for their children. She was prescribed alprazolam for prn use, only used sparingly.  She started on Prozac by Dr. Henderson Cloud 1-2 years ago. Her husband improved, is back to work (though she reports he can't really multitask well, so works and cooks dinner, but leaves her with a lot of other responsibilities).  Her daughter,[name] had behavioral issues starting in kindergarten (hyper, misbehaving in school, some violent behavior towards classmates).  She underwent evaluation for autism, and is on the spectrum. She is getting OT and therapy.  She did much  better this year in first grade (ess chaos, less environment changes due to COVID). She reports she "lets it loose" when she gets home.  Has a new therapist for her. She reports that she is changing jobs.  Leaving the [employer]. It is stressful, too many hours of work, and too sedentary.  She has taken a job at [employer], which is fewer hours (32), less money, but will be less stressful, and allow her to be more available for the kids, [name]'s appointments, etc.  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  As far as the Prozac, she feels that "it has done its job". She feels that it makes her flat emotionally, and she would like to taper down/off.  She would like to get therapy for herself--in order to be able to deal with the change in her husband, menopause, challenges with kids.  She previously took paxil, and recalls it was a "nightmare to get off of it".  Took x 6 years  Allergies: She takes claritin daily.  Currently with cold symptoms.  Vitamin D deficiency: Level was low at 24in the past. Treated with 12 weeks of prescription therapy. At point level was 85 (?on 5000 IU daily). Shereports she cut back on the dose to 1000 IU daily.  Repeat level was 59.8 in 01/2018. She continues to take 1000 IU daily.   Immunization History  Administered Date(s) Administered  . Influenza Split 07/17/2012  . Influenza,inj,Quad PF,6+ Mos 09/15/2013, 07/05/2014, 05/02/2016, 08/19/2018  . Influenza-Unspecified 10/14/2017  . PFIZER SARS-COV-2 Vaccination  11/06/2019, 11/27/2019  . PPD Test 11/25/2012  . Tdap 07/16/2012   Last Pap smear: 09/2017 with Dr. Philis Pique, gets yearly exams Last mammogram: 11/2019, normal breast MRI 12/2019 Last colonoscopy: never  Last DEXA: never Dentist: Twice yearly Ophtho: yearly Exercise: Has a trainer 2x/week (mostly weights, some HIIT). Dance, Skip, Hip-hop and Run with neighborhood group on Saturdays for an hour (3-4 miles).  Runs  2x/week x 2 miles on treadmill.   PMH, PSH, SH and FH reviewed and updated  Outpatient Encounter Medications as of 02/17/2020  Medication Sig  . cetirizine (ZYRTEC) 10 MG tablet Take 10 mg by mouth daily.  . cholecalciferol (VITAMIN D3) 25 MCG (1000 UT) tablet Take 1,000 Units by mouth daily.  Marland Kitchen estradiol (ESTRACE) 0.5 MG tablet Take 0.5 mg by mouth daily.  Marland Kitchen FLUoxetine (PROZAC) 20 MG capsule Take 20 mg by mouth daily.  Marland Kitchen L-Theanine 200 MG CAPS Take 200 mg by mouth daily.   . progesterone (PROMETRIUM) 100 MG capsule Take 100 mg by mouth daily.  Marland Kitchen ALPRAZolam (XANAX) 0.25 MG tablet Take 1-2 tablets (0.25-0.5 mg total) by mouth 3 (three) times daily as needed for anxiety or sleep. (Patient not taking: Reported on 02/01/2020)  . fluticasone (FLONASE) 50 MCG/ACT nasal spray Place 1 spray into both nostrils daily as needed for allergies.  (Patient not taking: Reported on 02/17/2020)   Facility-Administered Encounter Medications as of 02/17/2020  Medication  . [START ON 02/23/2020] famotidine (PEPCID) tablet 20 mg   Allergies  Allergen Reactions  . Citric Acid Other (See Comments)    GI UPSET    ROS: The patient denies anorexia, fever, headaches, vision changes, decreased hearing, ear pain, sore throat, breast concerns, chest pain, palpitations, syncope, dyspnea on exertion, cough, swelling, nausea, vomiting, diarrhea, constipation, abdominal pain, melena, hematochezia, indigestion/heartburn, hematuria, dysuria, vaginal discharge, odor or itch, genital lesions, joint pains, numbness, tingling, weakness, tremor, suspicious skin lesions, abnormal bleeding/bruising, or enlarged lymph nodes. Allergies controlled. Trouble losing abdominal fat   PHYSICAL EXAM:  BP 120/88   Pulse 76   Temp 98.6 F (37 C) (Temporal)   Ht 5\' 6"  (1.676 m)   Wt 141 lb 3.2 oz (64 kg)   BMI 22.79 kg/m   Wt Readings from Last 3 Encounters:  02/17/20 141 lb 3.2 oz (64 kg)  02/10/20 142 lb 6 oz (64.6 kg)  02/15/19  137 lb (62.1 kg)     General Appearance:  Alert, cooperative, no distress, appears stated age  Head:  Normocephalic, without obvious abnormality, atraumatic  Eyes:  PERRL, conjunctiva/corneas clear, EOM's intact, fundi benign  Ears:  Normal TM's and external ear canals  Nose: Nasal mucosa is moderately edematous, clear mucus on the left. Sinuses nontender  Throat: OP is clear.  Mild erythema at anterior tonsillar pillars bilaterally, otherwise normal.  Neck: Supple, no lymphadenopathy; thyroid: no enlargement/ tenderness/nodules; no carotid bruit or JVD  Back:  Spine nontender, no curvature, ROM normal, no CVA tenderness  Lungs:  Clear to auscultation bilaterally without wheezes, ralesorronchi; respirations unlabored  Chest Wall:  No tenderness or deformity  Heart:  Regular rate and rhythm, S1 and S2 normal, no murmur, rub or gallop  Breast Exam:  Deferred to GYN  Abdomen:  Soft, non-tender, nondistended, normoactive bowel sounds,no masses, no hepatosplenomegaly  Genitalia:  Deferred to GYN     Extremities: No clubbing, cyanosis or edema.   Pulses: 2+ and symmetric all extremities  Skin: Skin color, texture, turgor normal  Lymph nodes: Cervical, supraclavicular, and axillary nodes  normal  Neurologic: CNII-XII intact, normal strength, sensation and gait; reflexes 2+ and symmetric throughout   Psych: Normal mood, affect, hygiene and grooming  PHQ-9 score of 7 GAD-7 score of 1 (irritability only)   ASSESSMENT/PLAN:  Annual physical exam  Allergic rhinitis, unspecified seasonality, unspecified trigger - controlled with zyrtec. Currently has a mild viral URI. Cont supportive measures, reviewed s/sx bacterial infections  Vitamin D deficiency - cont 1000 IU daily  Family history of ovarian cancer - having bilateral ovaries removed along with hysterectomy next week  Family history of breast cancer in  mother - She plans to continue HRT after surgery (discussed that progesterone will be stopped, and to use lowest effective estrogen dose)  Colon cancer screening - refer for colonoscopy. Discussed screening options, including Cologuard - Plan: Ambulatory referral to Gastroenterology  Depression, major, single episode, in partial remission (HCC) - Prev had more anxiety. On today's screens, seems more depression. Will start counseling, and cut back on Prozac to 10mg .  Increase if sx worsen - Plan: FLUoxetine (PROZAC) 10 MG capsule   Counseled re: weight, diet, exercise Discussed weight expectations (about to have hysterectomy, will have limited exercise and no core work for at least 6 weeks).  Discussed not to compare herself to others, it is okay for bodies to change.  Her weight is not unhealthy.  She is active, exercising.  She is eating fairly well (high protein breakfast, lighter dinner).  Discussed some causes of empty calories (finishing foods off kids plate, alcohol, etc). Reassured.  Recommended she check with her insurance regarding therapists on her plan.  Discussed [place], and others, where she can check out the bio's on their websites. To f/u if worsening depression/moods, despite counseling.  If moods worsen, to bump up the Prozac dose back to 20mg , and/or f/u to discuss other med options, if she prefers.  Discussed monthly self breast exams and yearly mammograms; at least 30 minutes of aerobic activity at least 5 days/week, weight-bearing exercise 2-3x/wk; proper sunscreen use reviewed; healthy diet, including goals of calcium and vitamin D intake and alcohol recommendations (less than or equal to 1 drink/day) reviewed; regular seatbelt use; changing batteries in smoke detectors. Immunization recommendations discussed--yearly flu shots recommended.Shingrix age 82.Colonoscopy recommendations reviewed, due now (since guidelines changed to age 22). Referred to [facility].  F/u 1 year,  sooner prn.  Significant time spent separately from wellness discussing depression, moods, counseling, medications

## 2020-02-17 ENCOUNTER — Encounter: Payer: Self-pay | Admitting: Family Medicine

## 2020-02-17 ENCOUNTER — Ambulatory Visit: Payer: Managed Care, Other (non HMO) | Admitting: Family Medicine

## 2020-02-17 VITALS — BP 120/88 | HR 76 | Temp 98.6°F | Ht 66.0 in | Wt 141.2 lb

## 2020-02-17 DIAGNOSIS — F324 Major depressive disorder, single episode, in partial remission: Secondary | ICD-10-CM | POA: Diagnosis not present

## 2020-02-17 DIAGNOSIS — E559 Vitamin D deficiency, unspecified: Secondary | ICD-10-CM

## 2020-02-17 DIAGNOSIS — J309 Allergic rhinitis, unspecified: Secondary | ICD-10-CM | POA: Diagnosis not present

## 2020-02-17 DIAGNOSIS — Z803 Family history of malignant neoplasm of breast: Secondary | ICD-10-CM

## 2020-02-17 DIAGNOSIS — Z1211 Encounter for screening for malignant neoplasm of colon: Secondary | ICD-10-CM | POA: Diagnosis not present

## 2020-02-17 DIAGNOSIS — Z0000 Encounter for general adult medical examination without abnormal findings: Secondary | ICD-10-CM

## 2020-02-17 DIAGNOSIS — Z8041 Family history of malignant neoplasm of ovary: Secondary | ICD-10-CM | POA: Diagnosis not present

## 2020-02-17 MED ORDER — FLUOXETINE HCL 10 MG PO CAPS: 10.0000 mg | ORAL_CAPSULE | Freq: Every day | ORAL | 3 refills | Status: DC

## 2020-02-19 ENCOUNTER — Other Ambulatory Visit (HOSPITAL_COMMUNITY)
Admission: RE | Admit: 2020-02-19 | Discharge: 2020-02-19 | Disposition: A | Discharge status: Home / Self Care | Payer: Managed Care, Other (non HMO) | Source: Ambulatory Visit | Attending: Obstetrics and Gynecology | Admitting: Obstetrics and Gynecology

## 2020-02-19 DIAGNOSIS — Z01812 Encounter for preprocedural laboratory examination: Secondary | ICD-10-CM | POA: Diagnosis not present

## 2020-02-19 DIAGNOSIS — Z20822 Contact with and (suspected) exposure to covid-19: Secondary | ICD-10-CM | POA: Diagnosis not present

## 2020-02-19 LAB — SARS CORONAVIRUS 2 (TAT 6-24 HRS): SARS Coronavirus 2: NEGATIVE

## 2020-02-21 ENCOUNTER — Encounter: Payer: Self-pay | Admitting: Family Medicine

## 2020-02-23 ENCOUNTER — Encounter (HOSPITAL_BASED_OUTPATIENT_CLINIC_OR_DEPARTMENT_OTHER)
Admission: RE | Disposition: A | Discharge status: Home / Self Care | Payer: Self-pay | Source: Ambulatory Visit | Attending: Obstetrics and Gynecology

## 2020-02-23 ENCOUNTER — Other Ambulatory Visit: Payer: Self-pay

## 2020-02-23 ENCOUNTER — Ambulatory Visit (HOSPITAL_BASED_OUTPATIENT_CLINIC_OR_DEPARTMENT_OTHER): Payer: Managed Care, Other (non HMO) | Admitting: Anesthesiology

## 2020-02-23 ENCOUNTER — Encounter (HOSPITAL_BASED_OUTPATIENT_CLINIC_OR_DEPARTMENT_OTHER): Payer: Self-pay | Admitting: Obstetrics and Gynecology

## 2020-02-23 ENCOUNTER — Ambulatory Visit (HOSPITAL_BASED_OUTPATIENT_CLINIC_OR_DEPARTMENT_OTHER)
Admission: RE | Admit: 2020-02-23 | Discharge: 2020-02-23 | Disposition: A | Discharge status: Home / Self Care | Payer: Managed Care, Other (non HMO) | Source: Ambulatory Visit | Attending: Obstetrics and Gynecology | Admitting: Obstetrics and Gynecology

## 2020-02-23 DIAGNOSIS — N72 Inflammatory disease of cervix uteri: Secondary | ICD-10-CM | POA: Diagnosis not present

## 2020-02-23 DIAGNOSIS — F419 Anxiety disorder, unspecified: Secondary | ICD-10-CM | POA: Diagnosis not present

## 2020-02-23 DIAGNOSIS — N393 Stress incontinence (female) (male): Secondary | ICD-10-CM | POA: Diagnosis not present

## 2020-02-23 DIAGNOSIS — N814 Uterovaginal prolapse, unspecified: Secondary | ICD-10-CM | POA: Diagnosis not present

## 2020-02-23 DIAGNOSIS — Z9889 Other specified postprocedural states: Secondary | ICD-10-CM

## 2020-02-23 DIAGNOSIS — Z8041 Family history of malignant neoplasm of ovary: Secondary | ICD-10-CM | POA: Insufficient documentation

## 2020-02-23 DIAGNOSIS — F329 Major depressive disorder, single episode, unspecified: Secondary | ICD-10-CM | POA: Diagnosis not present

## 2020-02-23 DIAGNOSIS — Z79899 Other long term (current) drug therapy: Secondary | ICD-10-CM | POA: Diagnosis not present

## 2020-02-23 DIAGNOSIS — Z9079 Acquired absence of other genital organ(s): Secondary | ICD-10-CM | POA: Insufficient documentation

## 2020-02-23 DIAGNOSIS — Z87891 Personal history of nicotine dependence: Secondary | ICD-10-CM | POA: Insufficient documentation

## 2020-02-23 HISTORY — PX: CYSTOSCOPY: SHX5120

## 2020-02-23 HISTORY — PX: BLADDER SUSPENSION: SHX72

## 2020-02-23 HISTORY — PX: CYSTOCELE REPAIR: SHX163

## 2020-02-23 HISTORY — PX: ROBOTIC ASSISTED TOTAL HYSTERECTOMY WITH BILATERAL SALPINGO OOPHERECTOMY: SHX6086

## 2020-02-23 LAB — POCT PREGNANCY, URINE: Preg Test, Ur: NEGATIVE

## 2020-02-23 LAB — POCT HEMOGLOBIN-HEMACUE: Hemoglobin: 12.9 g/dL (ref 12.0–15.0)

## 2020-02-23 SURGERY — HYSTERECTOMY, TOTAL, ROBOT-ASSISTED, LAPAROSCOPIC, WITH BILATERAL SALPINGO-OOPHORECTOMY
Anesthesia: General | Site: Abdomen

## 2020-02-23 MED ORDER — MEPERIDINE HCL 25 MG/ML IJ SOLN: 6.2500 mg | INTRAMUSCULAR | Status: DC | PRN

## 2020-02-23 MED ORDER — OXYCODONE-ACETAMINOPHEN 5-325 MG PO TABS
ORAL_TABLET | ORAL | Status: AC
  Filled 2020-02-23: qty 1

## 2020-02-23 MED ORDER — MIDAZOLAM HCL 2 MG/2ML IJ SOLN
INTRAMUSCULAR | Status: AC
  Filled 2020-02-23: qty 2

## 2020-02-23 MED ORDER — SODIUM CHLORIDE 0.9 % IR SOLN
Status: DC | PRN
  Administered 2020-02-23: 3000 mL

## 2020-02-23 MED ORDER — PROPOFOL 10 MG/ML IV BOLUS
INTRAVENOUS | Status: DC | PRN
  Administered 2020-02-23 (×2): 100 mg via INTRAVENOUS

## 2020-02-23 MED ORDER — GABAPENTIN 300 MG PO CAPS
ORAL_CAPSULE | ORAL | Status: AC
  Filled 2020-02-23: qty 1

## 2020-02-23 MED ORDER — ONDANSETRON HCL 4 MG/2ML IJ SOLN
INTRAMUSCULAR | Status: AC
  Filled 2020-02-23: qty 2

## 2020-02-23 MED ORDER — ONDANSETRON HCL 4 MG/2ML IJ SOLN
INTRAMUSCULAR | Status: DC | PRN
  Administered 2020-02-23: 4 mg via INTRAVENOUS

## 2020-02-23 MED ORDER — EPHEDRINE 5 MG/ML INJ
INTRAVENOUS | Status: AC
  Filled 2020-02-23: qty 10

## 2020-02-23 MED ORDER — ONDANSETRON HCL 4 MG/2ML IJ SOLN: 4.0000 mg | Freq: Once | INTRAMUSCULAR | Status: DC | PRN

## 2020-02-23 MED ORDER — LIDOCAINE 2% (20 MG/ML) 5 ML SYRINGE
INTRAMUSCULAR | Status: AC
  Filled 2020-02-23: qty 5

## 2020-02-23 MED ORDER — FENTANYL CITRATE (PF) 250 MCG/5ML IJ SOLN
INTRAMUSCULAR | Status: AC
  Filled 2020-02-23: qty 5

## 2020-02-23 MED ORDER — SUGAMMADEX SODIUM 200 MG/2ML IV SOLN
INTRAVENOUS | Status: DC | PRN
  Administered 2020-02-23: 200 mg via INTRAVENOUS

## 2020-02-23 MED ORDER — CELECOXIB 200 MG PO CAPS
400.0000 mg | ORAL_CAPSULE | ORAL | Status: AC
  Administered 2020-02-23: 400 mg via ORAL

## 2020-02-23 MED ORDER — LACTATED RINGERS IV SOLN
INTRAVENOUS | Status: DC
  Administered 2020-02-23: 125 mL via INTRAVENOUS

## 2020-02-23 MED ORDER — MIDAZOLAM HCL 5 MG/5ML IJ SOLN
INTRAMUSCULAR | Status: DC | PRN
  Administered 2020-02-23: 2 mg via INTRAVENOUS

## 2020-02-23 MED ORDER — OXYCODONE-ACETAMINOPHEN 5-325 MG PO TABS: 1.0000 | ORAL_TABLET | ORAL | 0 refills | Status: DC | PRN

## 2020-02-23 MED ORDER — ROCURONIUM BROMIDE 100 MG/10ML IV SOLN
INTRAVENOUS | Status: DC | PRN
  Administered 2020-02-23: 70 mg via INTRAVENOUS

## 2020-02-23 MED ORDER — OXYCODONE-ACETAMINOPHEN 5-325 MG PO TABS
ORAL_TABLET | ORAL | Status: AC
  Filled 2020-02-23: qty 2

## 2020-02-23 MED ORDER — MENTHOL 3 MG MT LOZG: 1.0000 | LOZENGE | OROMUCOSAL | Status: DC | PRN

## 2020-02-23 MED ORDER — SCOPOLAMINE 1 MG/3DAYS TD PT72
MEDICATED_PATCH | TRANSDERMAL | Status: DC | PRN
  Administered 2020-02-23: 1 via TRANSDERMAL

## 2020-02-23 MED ORDER — GABAPENTIN 300 MG PO CAPS
300.0000 mg | ORAL_CAPSULE | ORAL | Status: AC
  Administered 2020-02-23: 300 mg via ORAL

## 2020-02-23 MED ORDER — LIDOCAINE HCL (CARDIAC) PF 100 MG/5ML IV SOSY
PREFILLED_SYRINGE | INTRAVENOUS | Status: DC | PRN
  Administered 2020-02-23: 100 mg via INTRAVENOUS

## 2020-02-23 MED ORDER — OXYCODONE-ACETAMINOPHEN 5-325 MG PO TABS
1.0000 | ORAL_TABLET | ORAL | Status: DC | PRN
  Administered 2020-02-23: 2 via ORAL
  Administered 2020-02-23 (×2): 1 via ORAL

## 2020-02-23 MED ORDER — ESTRADIOL 0.1 MG/GM VA CREA
TOPICAL_CREAM | VAGINAL | Status: DC | PRN
  Administered 2020-02-23: 1 via VAGINAL

## 2020-02-23 MED ORDER — ONDANSETRON HCL 4 MG/2ML IJ SOLN: 4.0000 mg | Freq: Four times a day (QID) | INTRAMUSCULAR | Status: DC | PRN

## 2020-02-23 MED ORDER — KETOROLAC TROMETHAMINE 30 MG/ML IJ SOLN
INTRAMUSCULAR | Status: AC
  Filled 2020-02-23: qty 1

## 2020-02-23 MED ORDER — HYDROMORPHONE HCL 1 MG/ML IJ SOLN
0.2500 mg | INTRAMUSCULAR | Status: DC | PRN
  Administered 2020-02-23: 0.25 mg via INTRAVENOUS
  Administered 2020-02-23: 0.5 mg via INTRAVENOUS
  Administered 2020-02-23: 0.25 mg via INTRAVENOUS

## 2020-02-23 MED ORDER — CEFAZOLIN SODIUM-DEXTROSE 2-4 GM/100ML-% IV SOLN
INTRAVENOUS | Status: AC
  Filled 2020-02-23: qty 100

## 2020-02-23 MED ORDER — DEXAMETHASONE SODIUM PHOSPHATE 4 MG/ML IJ SOLN
INTRAMUSCULAR | Status: DC | PRN
  Administered 2020-02-23: 10 mg via INTRAVENOUS

## 2020-02-23 MED ORDER — KETOROLAC TROMETHAMINE 15 MG/ML IJ SOLN: 15.0000 mg | INTRAMUSCULAR | Status: DC

## 2020-02-23 MED ORDER — FENTANYL CITRATE (PF) 100 MCG/2ML IJ SOLN
INTRAMUSCULAR | Status: DC | PRN
  Administered 2020-02-23: 50 ug via INTRAVENOUS
  Administered 2020-02-23: 100 ug via INTRAVENOUS
  Administered 2020-02-23: 50 ug via INTRAVENOUS
  Administered 2020-02-23: 100 ug via INTRAVENOUS
  Administered 2020-02-23: 50 ug via INTRAVENOUS

## 2020-02-23 MED ORDER — ACETAMINOPHEN 500 MG PO TABS
ORAL_TABLET | ORAL | Status: AC
  Filled 2020-02-23: qty 2

## 2020-02-23 MED ORDER — PROPOFOL 10 MG/ML IV BOLUS
INTRAVENOUS | Status: AC
  Filled 2020-02-23: qty 20

## 2020-02-23 MED ORDER — LIDOCAINE-EPINEPHRINE 1 %-1:100000 IJ SOLN
INTRAMUSCULAR | Status: DC | PRN
  Administered 2020-02-23: 4 mL

## 2020-02-23 MED ORDER — EPHEDRINE SULFATE 50 MG/ML IJ SOLN
INTRAMUSCULAR | Status: DC | PRN
  Administered 2020-02-23: 10 mg via INTRAVENOUS
  Administered 2020-02-23: 5 mg via INTRAVENOUS
  Administered 2020-02-23: 10 mg via INTRAVENOUS

## 2020-02-23 MED ORDER — CEFAZOLIN SODIUM-DEXTROSE 2-4 GM/100ML-% IV SOLN
2.0000 g | INTRAVENOUS | Status: AC
  Administered 2020-02-23: 2 g via INTRAVENOUS

## 2020-02-23 MED ORDER — ACETAMINOPHEN 500 MG PO TABS
1000.0000 mg | ORAL_TABLET | ORAL | Status: AC
  Administered 2020-02-23: 1000 mg via ORAL

## 2020-02-23 MED ORDER — IBUPROFEN 800 MG PO TABS: 800.0000 mg | ORAL_TABLET | Freq: Three times a day (TID) | ORAL | Status: DC

## 2020-02-23 MED ORDER — ROCURONIUM BROMIDE 10 MG/ML (PF) SYRINGE
PREFILLED_SYRINGE | INTRAVENOUS | Status: AC
  Filled 2020-02-23: qty 10

## 2020-02-23 MED ORDER — SODIUM CHLORIDE 0.9 % IV SOLN
INTRAVENOUS | Status: DC | PRN
  Administered 2020-02-23: 60 mL

## 2020-02-23 MED ORDER — ONDANSETRON HCL 4 MG PO TABS: 4.0000 mg | ORAL_TABLET | Freq: Four times a day (QID) | ORAL | Status: DC | PRN

## 2020-02-23 MED ORDER — DEXAMETHASONE SODIUM PHOSPHATE 10 MG/ML IJ SOLN
INTRAMUSCULAR | Status: AC
  Filled 2020-02-23: qty 1

## 2020-02-23 MED ORDER — HYDROMORPHONE HCL 1 MG/ML IJ SOLN
INTRAMUSCULAR | Status: AC
  Filled 2020-02-23: qty 1

## 2020-02-23 MED ORDER — SENNOSIDES-DOCUSATE SODIUM 8.6-50 MG PO TABS
1.0000 | ORAL_TABLET | Freq: Every evening | ORAL | Status: DC | PRN
  Filled 2020-02-23: qty 1

## 2020-02-23 MED ORDER — KETOROLAC TROMETHAMINE 30 MG/ML IJ SOLN
30.0000 mg | Freq: Four times a day (QID) | INTRAMUSCULAR | Status: DC
  Administered 2020-02-23: 30 mg via INTRAVENOUS

## 2020-02-23 MED ORDER — CELECOXIB 200 MG PO CAPS
ORAL_CAPSULE | ORAL | Status: AC
  Filled 2020-02-23: qty 2

## 2020-02-23 MED ORDER — FENTANYL CITRATE (PF) 100 MCG/2ML IJ SOLN
INTRAMUSCULAR | Status: AC
  Filled 2020-02-23: qty 2

## 2020-02-23 MED ORDER — INDIGOTINDISULFONATE SODIUM 8 MG/ML IJ SOLN
INTRAMUSCULAR | Status: DC | PRN
Start: 2020-02-23 — End: 2020-02-23
  Administered 2020-02-23: 5 mL via INTRAVENOUS

## 2020-02-23 SURGICAL SUPPLY — 61 items
BLADE SURG 15 STRL LF DISP TIS (BLADE) ×4 IMPLANT
BLADE SURG 15 STRL SS (BLADE) ×8
COVER BACK TABLE 60X90IN (DRAPES) ×4 IMPLANT
COVER TIP SHEARS 8 DVNC (MISCELLANEOUS) ×2 IMPLANT
COVER TIP SHEARS 8MM DA VINCI (MISCELLANEOUS) ×4
COVER WAND RF STERILE (DRAPES) ×4 IMPLANT
DECANTER SPIKE VIAL GLASS SM (MISCELLANEOUS) ×4 IMPLANT
DEFOGGER SCOPE WARMER CLEARIFY (MISCELLANEOUS) ×4 IMPLANT
DERMABOND ADVANCED (GAUZE/BANDAGES/DRESSINGS) ×4
DERMABOND ADVANCED .7 DNX12 (GAUZE/BANDAGES/DRESSINGS) ×4 IMPLANT
DRAPE ARM DVNC X/XI (DISPOSABLE) ×8 IMPLANT
DRAPE COLUMN DVNC XI (DISPOSABLE) ×2 IMPLANT
DRAPE DA VINCI XI ARM (DISPOSABLE) ×16
DRAPE DA VINCI XI COLUMN (DISPOSABLE) ×4
DURAPREP 26ML APPLICATOR (WOUND CARE) ×4 IMPLANT
ELECT REM PT RETURN 9FT ADLT (ELECTROSURGICAL) ×4
ELECTRODE REM PT RTRN 9FT ADLT (ELECTROSURGICAL) ×2 IMPLANT
GAUZE PACKING 2X5 YD STRL (GAUZE/BANDAGES/DRESSINGS) ×4 IMPLANT
GLOVE BIO SURGEON STRL SZ7 (GLOVE) ×12 IMPLANT
GLOVE BIOGEL PI IND STRL 7.0 (GLOVE) ×4 IMPLANT
GLOVE BIOGEL PI INDICATOR 7.0 (GLOVE) ×4
GOWN STRL REUS W/TWL LRG LVL3 (GOWN DISPOSABLE) ×12 IMPLANT
HOLDER FOLEY CATH W/STRAP (MISCELLANEOUS) ×4 IMPLANT
IRRIG SUCT STRYKERFLOW 2 WTIP (MISCELLANEOUS) ×4
IRRIGATION SUCT STRKRFLW 2 WTP (MISCELLANEOUS) ×2 IMPLANT
LEGGING LITHOTOMY PAIR STRL (DRAPES) ×4 IMPLANT
MANIFOLD NEPTUNE II (INSTRUMENTS) ×4 IMPLANT
MANIPULATOR ADVINCU DEL 2.5 PL (MISCELLANEOUS) ×4 IMPLANT
NEEDLE HYPO 22GX1.5 SAFETY (NEEDLE) ×4 IMPLANT
NEEDLE INSUFFLATION 120MM (ENDOMECHANICALS) ×4 IMPLANT
NS IRRIG 500ML POUR BTL (IV SOLUTION) ×4 IMPLANT
OBTURATOR OPTICAL STANDARD 8MM (TROCAR) ×4
OBTURATOR OPTICAL STND 8 DVNC (TROCAR) ×2
OBTURATOR OPTICALSTD 8 DVNC (TROCAR) ×2 IMPLANT
PACK ROBOT WH (CUSTOM PROCEDURE TRAY) ×4 IMPLANT
PACK ROBOTIC GOWN (GOWN DISPOSABLE) ×4 IMPLANT
PACK TRENDGUARD 450 HYBRID PRO (MISCELLANEOUS) ×2 IMPLANT
PACK VAGINAL WOMENS (CUSTOM PROCEDURE TRAY) ×4 IMPLANT
PAD PREP 24X48 CUFFED NSTRL (MISCELLANEOUS) ×4 IMPLANT
POUCH LAPAROSCOPIC INSTRUMENT (MISCELLANEOUS) ×4 IMPLANT
PROTECTOR NERVE ULNAR (MISCELLANEOUS) ×8 IMPLANT
SEAL CANN UNIV 5-8 DVNC XI (MISCELLANEOUS) ×6 IMPLANT
SEAL XI 5MM-8MM UNIVERSAL (MISCELLANEOUS) ×12
SET IRRIG Y TYPE TUR BLADDER L (SET/KITS/TRAYS/PACK) ×4 IMPLANT
SET TRI-LUMEN FLTR TB AIRSEAL (TUBING) ×4 IMPLANT
SLING TRANS VAGINAL TAPE (Sling) ×2 IMPLANT
SLING UTERINE/ABD GYNECARE TVT (Sling) ×2 IMPLANT
SURGIFLO W/THROMBIN 8M KIT (HEMOSTASIS) ×4 IMPLANT
SUT VIC AB 0 CT1 18XCR BRD8 (SUTURE) ×2 IMPLANT
SUT VIC AB 0 CT1 36 (SUTURE) ×4 IMPLANT
SUT VIC AB 0 CT1 8-18 (SUTURE) ×4
SUT VIC AB 2-0 CT1 27 (SUTURE) ×4
SUT VIC AB 2-0 CT1 TAPERPNT 27 (SUTURE) ×2 IMPLANT
SUT VIC AB 2-0 UR6 27 (SUTURE) ×4 IMPLANT
SUT VICRYL RAPIDE 3 0 (SUTURE) ×8 IMPLANT
SUT VLOC 180 0 9IN  GS21 (SUTURE) ×4
SUT VLOC 180 0 9IN GS21 (SUTURE) ×2 IMPLANT
TOWEL OR 17X26 10 PK STRL BLUE (TOWEL DISPOSABLE) ×4 IMPLANT
TRAY FOLEY W/BAG SLVR 14FR (SET/KITS/TRAYS/PACK) ×4 IMPLANT
TRENDGUARD 450 HYBRID PRO PACK (MISCELLANEOUS) ×4
TROCAR PORT AIRSEAL 5X120 (TROCAR) ×4 IMPLANT

## 2020-02-23 NOTE — Discharge Instructions (Signed)

## 2020-02-23 NOTE — Discharge Summary (Signed)
Physician Discharge Summary  Patient ID: COURNEY GARROD MRN: [id number] DOB/AGE: Jan 07, 1971 49 y.o.  Admit date: 02/23/2020 Discharge date: 02/23/2020  Admission Diagnoses: stress incontinence, uterine decensus, family history of ovarian cancer  Discharge Diagnoses:  Active Problems:   Postoperative state   Discharged Condition: good  Hospital Course: Uncomplicated robotic TLH, oopherectomies, cystoscopy, TVT, anterior repair  Consults: None  Significant Diagnostic Studies: labs: none  Treatments: surgery: TLH, oopherectomies, cystoscopy, TVT, anterior repair   Discharge Exam: Blood pressure 130/88, pulse 69, temperature 98.4 F (36.9 C), temperature source Oral, resp. rate 15, height 5\' 6"  (1.676 m), weight 64.6 kg, SpO2 98 %.   Disposition: Discharge disposition: 01-Home or Self Care       Discharge Instructions    Call MD for:  temperature >100.4   Complete by: As directed    Diet - low sodium heart healthy   Complete by: As directed    Discharge instructions   Complete by: As directed    No driving on narcotics, no sexual activity for 2 weeks.   Increase activity slowly   Complete by: As directed    May shower / Bathe   Complete by: As directed    Shower, no bath for 2 weeks.   Remove dressing in 24 hours   Complete by: As directed    Sexual Activity Restrictions   Complete by: As directed    No sexual activity for 2 weeks.     Allergies as of 02/23/2020      Reactions   Citric Acid Other (See Comments)   GI UPSET      Medication List    TAKE these medications   ALPRAZolam 0.25 MG tablet Commonly known as: XANAX Take 1-2 tablets (0.25-0.5 mg total) by mouth 3 (three) times daily as needed for anxiety or sleep.   cetirizine 10 MG tablet Commonly known as: ZYRTEC Take 10 mg by mouth daily.   cholecalciferol 25 MCG (1000 UNIT) tablet Commonly known as: VITAMIN D3 Take 1,000 Units by mouth daily.   estradiol 0.5 MG tablet Commonly  known as: ESTRACE Take 0.5 mg by mouth daily.   FLUoxetine 10 MG capsule Commonly known as: PROZAC Take 1 capsule (10 mg total) by mouth daily.   fluticasone 50 MCG/ACT nasal spray Commonly known as: FLONASE Place 1 spray into both nostrils daily as needed for allergies.   L-Theanine 200 MG Caps Take 200 mg by mouth daily.   oxyCODONE-acetaminophen 5-325 MG tablet Commonly known as: PERCOCET/ROXICET Take 1 tablet by mouth every 4 (four) hours as needed for severe pain.   progesterone 100 MG capsule Commonly known as: PROMETRIUM Take 100 mg by mouth daily.       Follow-up Information    02/25/2020, MD Follow up in 2 day(s).   Specialty: Obstetrics and Gynecology Why: for voiding trial, come at 08:30 on Friday Contact information: [address] [place] [phone]               Signed: [phone] 02/23/2020, 12:53 PM

## 2020-02-23 NOTE — Transfer of Care (Signed)
Immediate Anesthesia Transfer of Care Note  Patient: Sabrina Chapman  Procedure(s) Performed: Procedure(s) (LRB): XI ROBOTIC ASSISTED TOTAL HYSTERECTOMY WITH BILATERAL SALPINGO OOPHORECTOMY (Bilateral) TRANSVAGINAL TAPE (TVT) PROCEDURE (N/A) CYSTOSCOPY (N/A) ANTERIOR REPAIR (CYSTOCELE) (N/A)  Patient Location: PACU  Anesthesia Type: General  Level of Consciousness: awake, sedated, patient cooperative and responds to stimulation  Airway & Oxygen Therapy: Patient Spontanous Breathing and Patient connected to [place] 02 and soft FM   Post-op Assessment: Report given to PACU RN, Post -op Vital signs reviewed and stable and Patient moving all extremities  Post vital signs: Reviewed and stable  Complications: No apparent anesthesia complications

## 2020-02-23 NOTE — Op Note (Signed)
02/23/2020  12:41 PM  PATIENT:  Sabrina Chapman  49 y.o. female  PRE-OPERATIVE DIAGNOSIS:  stress urinary incontinence  POST-OPERATIVE DIAGNOSIS:  stress urinary incontinence  PROCEDURE:  Procedure(s): XI ROBOTIC ASSISTED TOTAL HYSTERECTOMY WITH BILATERAL SALPINGO OOPHORECTOMY (Bilateral) TRANSVAGINAL TAPE (TVT) PROCEDURE (N/A) CYSTOSCOPY (N/A) ANTERIOR REPAIR (CYSTOCELE) (N/A)  SURGEON:  Surgeon(s) and Role:    * Bobbye Charleston, MD - Primary    * Taam-Akelman, Lawrence Santiago, MD - Assisting  ANESTHESIA:   general  EBL:  100 mL   LOCAL MEDICATIONS USED:  OTHER Ropivicaine, indigo carmine, estrace, surgiflo  SPECIMEN:  Source of Specimen:  uterus, cervix, bilateral ovaries, tubal remnants at cornua  DISPOSITION OF SPECIMEN:  PATHOLOGY  COUNTS:  YES  TOURNIQUET:  * No tourniquets in log *  DICTATION: .Note written in EPIC  PLAN OF CARE: Admit for overnight observation  PATIENT DISPOSITION:  PACU - hemodynamically stable.   Delay start of Pharmacological VTE agent (>24hrs) due to surgical blood loss or risk of bleeding: not applicable  Complications:  None.  Findings: <6 weeks size uterus.  Ovaries were normal.  The ureters were identified during multiple points of the case and were always out of the field of dissection.  On cystoscopy, the bladder was intact and bilateral spill was seen from each ureteral orriface.   Technique:   After adequate anesthesia was achieved the patient was positioned, prepped and draped in usual sterile fashion.  A speculum was placed in the vagina and the cervix dilated with pratt dilators.  The 2.5 cm Koh ring Advincula was assembled and placed in proper fashion.  The  Speculum was removed and the bladder catheterized with a foley.     Attention was turned to the abdomen where a 1 cm incision was made 1 cm above the umbilicus.  The veress needle was introduced without aspiration of bowel contents or blood and the abdomen insufflated.  The 8.5 mm Robotic trocar was placed and the other three trocar sites were marked out, all approximately 10 cm from each other and the camera.  Two 8.5 mm trocars were placed on either side of the camera port and a 5 mm assistant port was placed 3 cm above the line of the other trocars.  All trocars were inserted under direct visualization of the camera.  The patient was placed in trendelenburg and then the Robot docked.  The fenestrated bipolar were placed on arm 1 and the Hot shears on arm 3 and introduced under direct visualization of the camera.   I then broke scrub and sat down at the console.  The above findings were noted and the ureters identified well out of the field of dissection.  The IP ligament was then divided with the bipolar cautery and shears.  The posterior broad ligament was then divided with the hot shears until the uterosacral ligament.  The Broad and cardinal ligaments were then cauterized against the cervix to the level of the Koh ring, securing the uterine artery.  Each pedicle was then incised with the shears.  The anterior leaf was then incised at the reflection of the vessico-uterine junction and the lateral bladder retracted inferiorly after the round ligament had been divided with the bipolar forceps.  The left IP ligament divided with the bipolar forceps and the scissors.  The round ligament was divided as well and the posterior leaf of the broad ligament then divided with the hot shears. The broad and cardinal ligaments were then cauterized on the left  in the same way.   At the level of the internal os, the uterine arteries were bilaterally cauterized with the bipolar.  The ureters were identified well out of the field of dissection.     The bladder was then able to be retracted inferiorly and the vesico-uterine fascia was incised in the midline until the bladder was removed one cm below the Koh ring.  The hot shears then circumferentially incised the vagina at the level of the  reflection on the [facility] ring.  Once the uterus and cervix were amputated, cautery was used to insure hemostasis of the cuff.  Once hemostasis was achieved, the scissors were changed to the mega suture cut needle driver and the cuff was closed with a running stitches of 0-vicryl V loc.  Cautery was used to ensure hemostasis of the left pedicles very superficially. The ureters were peristalsing bilaterally well and very lateral to the areas of operation.     The Robot was then undocked and I scrubbed back in.  The needle was removed and Ropivicaine was introduced into the pelvis. The skin incisions were closed with subcuticular stitches of 3-0 vicryl Rapide and Dermabond.    Attention was then turned to the vagina.  Allises were used to grasp the vaginal mucosa in the midline superior to inferior just below the urethral opening.  The mucosa was injected with 1% lidocaine with epi in the midline and a scalpel used to incise the vaginal mucosa vertically.  Allises were used to retract the vaginal mucosa as the vesico-vaginal fascia was removed from the mucosa with blunt and sharp dissection and adequate room midurethral to pelvic floor bilaterally was developed.  Two small puncture incisions were then made two cm from midline just above the pubic symphysis.  The abdominal needles from the [employer]necare TVT set were introduced behind the pubic bone, through the pelvic floor and out the vagina carefully on each respective side, being careful to hug the posterior of the pubic bone.  The foley was removed and indigo carmine given.  Cystoscopy revealed excellent bilateral spill from each ureteral orifice and NO NEEDLES IN THE BLADDER.   The urethra was clear as well.  The foley was replaced and the vaginal needles attached to the abdominal needles.  The tape was pulled into place through the abdominal incisions, the sheaths removed while a kelly was in place under the mesh sling and the tape cut at the level of just below the  skin.  The tape was checked and found to be tight enough and laying flat.    Surgiflo was placed in the corners up by the pelvic floor to achieve hemostasis of some small bleeders out of reach and too close to the sling for stitches.  Once hemostasis was achieved, one mattress stitch of 0-vicryl were placed to close the vesico-vaginal fascia under the bladder.  The vaginal mucosa was trimmed and then closed with a running lock stitch of 2-0 vicryl.  A vaginal packing with estrace was placed and the patient returned to the recovery room in stable condition.      Porter Nakama A

## 2020-02-23 NOTE — Progress Notes (Signed)
There has been no change in the patients history, status or exam since the history and physical.  Vitals:   02/23/20 0925  BP: 130/88  Pulse: 69  Resp: 15  Temp: 98.4 F (36.9 C)  TempSrc: Oral  SpO2: 98%  Weight: 64.6 kg  Height: 5\' 6"  (1.676 m)    Results for orders placed or performed during the hospital encounter of 02/23/20 (from the past 72 hour(s))  Pregnancy, urine POC     Status: None   Collection Time: 02/23/20  8:55 AM  Result Value Ref Range   Preg Test, Ur NEGATIVE NEGATIVE    Comment:        THE SENSITIVITY OF THIS METHODOLOGY IS >24 mIU/mL   Hemoglobin-hemacue, POC     Status: None   Collection Time: 02/23/20  9:20 AM  Result Value Ref Range   Hemoglobin 12.9 12.0 - 15.0 g/dL    02/25/20

## 2020-02-23 NOTE — Brief Op Note (Signed)
02/23/2020  12:41 PM  PATIENT:  Jevon C Stratford-Owens  49 y.o. female  PRE-OPERATIVE DIAGNOSIS:  stress urinary incontinence  POST-OPERATIVE DIAGNOSIS:  stress urinary incontinence  PROCEDURE:  Procedure(s): XI ROBOTIC ASSISTED TOTAL HYSTERECTOMY WITH BILATERAL SALPINGO OOPHORECTOMY (Bilateral) TRANSVAGINAL TAPE (TVT) PROCEDURE (N/A) CYSTOSCOPY (N/A) ANTERIOR REPAIR (CYSTOCELE) (N/A)  SURGEON:  Surgeon(s) and Role:    * Carrington Clamp, MD - Primary    * Taam-Akelman, Griselda Miner, MD - Assisting  ANESTHESIA:   general  EBL:  100 mL   LOCAL MEDICATIONS USED:  OTHER Ropivicaine, indigo carmine, estrace, surgiflo  SPECIMEN:  Source of Specimen:  uterus, cervix, bilateral ovaries, tubal remnants at cornua  DISPOSITION OF SPECIMEN:  PATHOLOGY  COUNTS:  YES  TOURNIQUET:  * No tourniquets in log *  DICTATION: .Note written in EPIC  PLAN OF CARE: Admit for overnight observation  PATIENT DISPOSITION:  PACU - hemodynamically stable.   Delay start of Pharmacological VTE agent (>24hrs) due to surgical blood loss or risk of bleeding: not applicable

## 2020-02-23 NOTE — Anesthesia Postprocedure Evaluation (Signed)
Anesthesia Post Note  Patient: Sabrina Chapman  Procedure(s) Performed: XI ROBOTIC ASSISTED TOTAL HYSTERECTOMY WITH BILATERAL SALPINGO OOPHORECTOMY (Bilateral Abdomen) TRANSVAGINAL TAPE (TVT) PROCEDURE (N/A ) CYSTOSCOPY (N/A ) ANTERIOR REPAIR (CYSTOCELE) (N/A )     Patient location during evaluation: PACU Anesthesia Type: General Level of consciousness: awake and alert Pain management: pain level controlled Vital Signs Assessment: post-procedure vital signs reviewed and stable Respiratory status: spontaneous breathing, nonlabored ventilation, respiratory function stable and patient connected to nasal cannula oxygen Cardiovascular status: blood pressure returned to baseline and stable Postop Assessment: no apparent nausea or vomiting Anesthetic complications: no   No complications documented.  Last Vitals:  Vitals:   02/23/20 1423 02/23/20 1527  BP: 140/87 128/72  Pulse: 71 70  Resp: 16 18  Temp: 36.7 C 36.9 C  SpO2: 96% 96%    Last Pain:  Vitals:   02/23/20 1355  TempSrc:   PainSc: 4                  Journey Castonguay DAVID

## 2020-02-23 NOTE — Anesthesia Procedure Notes (Signed)
Procedure Name: Intubation Date/Time: 02/23/2020 10:44 AM Performed by: Justice Rocher, CRNA Pre-anesthesia Checklist: Patient identified, Emergency Drugs available, Suction available, Patient being monitored and Timeout performed Patient Re-evaluated:Patient Re-evaluated prior to induction Oxygen Delivery Method: Circle system utilized Preoxygenation: Pre-oxygenation with 100% oxygen Induction Type: IV induction Ventilation: Mask ventilation without difficulty Laryngoscope Size: Mac and 3 Grade View: Grade II Tube type: Oral Tube size: 7.0 mm Number of attempts: 1 Airway Equipment and Method: Stylet and Oral airway Placement Confirmation: ETT inserted through vocal cords under direct vision,  positive ETCO2,  breath sounds checked- equal and bilateral and CO2 detector Secured at: 23 cm Tube secured with: Tape Dental Injury: Teeth and Oropharynx as per pre-operative assessment

## 2020-02-23 NOTE — Anesthesia Preprocedure Evaluation (Signed)
Anesthesia Evaluation  Patient identified by MRN, date of birth, ID band Patient awake    Reviewed: Allergy & Precautions, NPO status , Patient's Chart, lab work & pertinent test results  Airway Mallampati: I  TM Distance: >3 FB Neck ROM: Full    Dental   Pulmonary former smoker,    Pulmonary exam normal        Cardiovascular Normal cardiovascular exam     Neuro/Psych Anxiety Depression    GI/Hepatic   Endo/Other    Renal/GU      Musculoskeletal   Abdominal   Peds  Hematology   Anesthesia Other Findings   Reproductive/Obstetrics                             Anesthesia Physical Anesthesia Plan  ASA: II  Anesthesia Plan: General   Post-op Pain Management:    Induction: Intravenous  PONV Risk Score and Plan: 3  Airway Management Planned: Oral ETT  Additional Equipment:   Intra-op Plan:   Post-operative Plan: Extubation in OR  Informed Consent: I have reviewed the patients History and Physical, chart, labs and discussed the procedure including the risks, benefits and alternatives for the proposed anesthesia with the patient or authorized representative who has indicated his/her understanding and acceptance.       Plan Discussed with: CRNA and Surgeon  Anesthesia Plan Comments:         Anesthesia Quick Evaluation

## 2020-02-24 ENCOUNTER — Encounter (HOSPITAL_BASED_OUTPATIENT_CLINIC_OR_DEPARTMENT_OTHER): Payer: Self-pay | Admitting: Obstetrics and Gynecology

## 2020-02-25 LAB — SURGICAL PATHOLOGY

## 2020-03-17 ENCOUNTER — Encounter: Payer: Self-pay | Admitting: Gastroenterology

## 2020-05-11 ENCOUNTER — Ambulatory Visit (AMBULATORY_SURGERY_CENTER): Payer: Self-pay | Admitting: *Deleted

## 2020-05-11 ENCOUNTER — Other Ambulatory Visit: Payer: Self-pay

## 2020-05-11 ENCOUNTER — Encounter: Payer: Self-pay | Admitting: Gastroenterology

## 2020-05-11 VITALS — Ht 66.0 in | Wt 144.0 lb

## 2020-05-11 DIAGNOSIS — Z1211 Encounter for screening for malignant neoplasm of colon: Secondary | ICD-10-CM

## 2020-05-11 MED ORDER — PLENVU 140 G PO SOLR: 1.0000 | ORAL | 0 refills | Status: DC

## 2020-05-11 NOTE — Progress Notes (Signed)
cov vax x 2  No egg or soy allergy known to patient  No issues with past sedation with any surgeries or procedures no intubation problems in the past  No FH of Malignant Hyperthermia No diet pills per patient No home 02 use per patient  No blood thinners per patient  Pt denies issues with constipation  No A fib or A flutter  EMMI video to pt or via MyChart  COVID 19 guidelines implemented in PV today with Pt and RN   Plenvu  Coupon given to pt in PV today , Code to Pharmacy   Due to the COVID-19 pandemic we are asking patients to follow these guidelines. Please only bring one care partner. Please be aware that your care partner may wait in the car in the parking lot or if they feel like they will be too hot to wait in the car, they may wait in the lobby on the 4th floor. All care partners are required to wear a mask the entire time (we do not have any that we can provide them), they need to practice social distancing, and we will do a Covid check for all patient's and care partners when you arrive. Also we will check their temperature and your temperature. If the care partner waits in their car they need to stay in the parking lot the entire time and we will call them on their cell phone when the patient is ready for discharge so they can bring the car to the front of the building. Also all patient's will need to wear a mask into building.  

## 2020-05-25 ENCOUNTER — Encounter: Payer: Managed Care, Other (non HMO) | Admitting: Gastroenterology

## 2020-06-14 ENCOUNTER — Encounter: Payer: Managed Care, Other (non HMO) | Admitting: Gastroenterology

## 2020-07-21 ENCOUNTER — Other Ambulatory Visit: Payer: Self-pay

## 2020-07-21 ENCOUNTER — Ambulatory Visit (AMBULATORY_SURGERY_CENTER): Payer: Managed Care, Other (non HMO) | Admitting: Gastroenterology

## 2020-07-21 ENCOUNTER — Encounter: Payer: Self-pay | Admitting: Gastroenterology

## 2020-07-21 VITALS — BP 129/77 | HR 60 | Temp 97.3°F | Resp 19 | Ht 66.0 in | Wt 144.0 lb

## 2020-07-21 DIAGNOSIS — Z1211 Encounter for screening for malignant neoplasm of colon: Secondary | ICD-10-CM | POA: Diagnosis not present

## 2020-07-21 MED ORDER — SODIUM CHLORIDE 0.9 % IV SOLN: 500.0000 mL | Freq: Once | INTRAVENOUS | Status: DC

## 2020-07-21 NOTE — Progress Notes (Signed)
Report to PACU, RN, vss, BBS= Clear.  

## 2020-07-21 NOTE — Op Note (Signed)
[facility] Patient Name: Sabrina Chapman Procedure Date: 07/21/2020 8:28 AM MRN: [id number] Endoscopist: Rachael Fee , MD Age: 49 Referring MD:  Date of Birth: [birth year] Gender: Female Account #: [id number] Procedure:                Colonoscopy Indications:              Screening for colorectal malignant neoplasm Medicines:                Monitored Anesthesia Care Procedure:                Pre-Anesthesia Assessment:                           - Prior to the procedure, a History and Physical                            was performed, and patient medications and                            allergies were reviewed. The patient's tolerance of                            previous anesthesia was also reviewed. The risks                            and benefits of the procedure and the sedation                            options and risks were discussed with the patient.                            All questions were answered, and informed consent                            was obtained. Prior Anticoagulants: The patient has                            taken no previous anticoagulant or antiplatelet                            agents. ASA Grade Assessment: II - A patient with                            mild systemic disease. After reviewing the risks                            and benefits, the patient was deemed in                            satisfactory condition to undergo the procedure.                           After obtaining informed consent, the colonoscope  was passed under direct vision. Throughout the                            procedure, the patient's blood pressure, pulse, and                            oxygen saturations were monitored continuously. The                            Colonoscope was introduced through the anus and                            advanced to the the cecum, identified by                            appendiceal orifice  and ileocecal valve. The                            colonoscopy was performed without difficulty. The                            patient tolerated the procedure well. The quality                            of the bowel preparation was good. The ileocecal                            valve, appendiceal orifice, and rectum were                            photographed. Scope In: 8:31:26 AM Scope Out: 8:48:32 AM Scope Withdrawal Time: 0 hours 10 minutes 0 seconds  Total Procedure Duration: 0 hours 17 minutes 6 seconds  Findings:                 The entire examined colon appeared normal on direct                            and retroflexion views. Complications:            No immediate complications. Estimated blood loss:                            None. Estimated Blood Loss:     Estimated blood loss: none. Impression:               - The entire examined colon is normal on direct and                            retroflexion views.                           - No polyps or cancers. Recommendation:           - Patient has a contact number available for  emergencies. The signs and symptoms of potential                            delayed complications were discussed with the                            patient. Return to normal activities tomorrow.                            Written discharge instructions were provided to the                            patient.                           - Resume previous diet.                           - Continue present medications.                           - Repeat colonoscopy in 10 years for screening. Rachael Fee, MD 07/21/2020 8:52:06 AM This report has been signed electronically.

## 2020-07-21 NOTE — Progress Notes (Signed)
VS by CW  Pt's states no medical or surgical changes since previsit or office visit.  

## 2020-07-21 NOTE — Patient Instructions (Addendum)
Resume previous diet and medications. ° °Repeat colonoscopy in 10 years. ° ° °YOU HAD AN ENDOSCOPIC PROCEDURE TODAY AT THE [facility]:   Refer to the procedure report that was given to you for any specific questions about what was found during the examination.  If the procedure report does not answer your questions, please call your gastroenterologist to clarify.  If you requested that your care partner not be given the details of your procedure findings, then the procedure report has been included in a sealed envelope for you to review at your convenience later. ° °YOU SHOULD EXPECT: Some feelings of bloating in the abdomen. Passage of more gas than usual.  Walking can help get rid of the air that was put into your GI tract during the procedure and reduce the bloating. If you had a lower endoscopy (such as a colonoscopy or flexible sigmoidoscopy) you may notice spotting of blood in your stool or on the toilet paper. If you underwent a bowel prep for your procedure, you may not have a normal bowel movement for a few days. ° °Please Note:  You might notice some irritation and congestion in your nose or some drainage.  This is from the oxygen used during your procedure.  There is no need for concern and it should clear up in a day or so. ° °SYMPTOMS TO REPORT IMMEDIATELY: ° °Following lower endoscopy (colonoscopy or flexible sigmoidoscopy): ° Excessive amounts of blood in the stool ° Significant tenderness or worsening of abdominal pains ° Swelling of the abdomen that is new, acute ° Fever of 100°F or higher ° ° °For urgent or emergent issues, a gastroenterologist can be reached at any hour by calling [phone]. °Do not use MyChart messaging for urgent concerns.  ° ° °DIET:  We do recommend a small meal at first, but then you may proceed to your regular diet.  Drink plenty of fluids but you should avoid alcoholic beverages for 24 hours. ° °ACTIVITY:  You should plan to take it easy for the rest of  today and you should NOT DRIVE or use heavy machinery until tomorrow (because of the sedation medicines used during the test).   ° °FOLLOW UP: °Our staff will call the number listed on your records 48-72 hours following your procedure to check on you and address any questions or concerns that you may have regarding the information given to you following your procedure. If we do not reach you, we will leave a message.  We will attempt to reach you two times.  During this call, we will ask if you have developed any symptoms of COVID 19. If you develop any symptoms (ie: fever, flu-like symptoms, shortness of breath, cough etc.) before then, please call [phone].  If you test positive for Covid 19 in the 2 weeks post procedure, please call and report this information to us.   ° °If any biopsies were taken you will be contacted by phone or by letter within the next 1-3 weeks.  Please call us at [phone] if you have not heard about the biopsies in 3 weeks.  ° ° °SIGNATURES/CONFIDENTIALITY: °You and/or your care partner have signed paperwork which will be entered into your electronic medical record.  These signatures attest to the fact that that the information above on your After Visit Summary has been reviewed and is understood.  Full responsibility of the confidentiality of this discharge information lies with you and/or your care-partner.  °

## 2020-07-25 ENCOUNTER — Telehealth: Payer: Self-pay | Admitting: *Deleted

## 2020-07-25 NOTE — Telephone Encounter (Signed)
  Follow up Call-  Call back number 07/21/2020  Post procedure Call Back phone  # [phone]  Permission to leave phone message Yes  Some recent data might be hidden     Patient questions:  Do you have a fever, pain , or abdominal swelling? No. Pain Score  0 *  Have you tolerated food without any problems? Yes.    Have you been able to return to your normal activities? Yes.    Do you have any questions about your discharge instructions: Diet   No. Medications  No. Follow up visit  No.  Do you have questions or concerns about your Care? No.  Actions: * If pain score is 4 or above: No action needed, pain <4.  1. Have you developed a fever since your procedure? no  2.   Have you had an respiratory symptoms (SOB or cough) since your procedure? no  3.   Have you tested positive for COVID 19 since your procedure no  4.   Have you had any family members/close contacts diagnosed with the COVID 19 since your procedure?  no   If yes to any of these questions please route to Laverna Peace, RN and Karlton Lemon, RN

## 2020-09-27 ENCOUNTER — Telehealth (INDEPENDENT_AMBULATORY_CARE_PROVIDER_SITE_OTHER): Payer: Managed Care, Other (non HMO) | Admitting: Medical

## 2020-09-27 ENCOUNTER — Other Ambulatory Visit: Payer: Self-pay

## 2020-09-27 ENCOUNTER — Encounter: Payer: Self-pay | Admitting: Medical

## 2020-09-27 VITALS — Temp 99.9°F | Ht 67.0 in | Wt 140.0 lb

## 2020-09-27 DIAGNOSIS — R52 Pain, unspecified: Secondary | ICD-10-CM | POA: Diagnosis not present

## 2020-09-27 DIAGNOSIS — U071 COVID-19: Secondary | ICD-10-CM | POA: Diagnosis not present

## 2020-09-27 DIAGNOSIS — R059 Cough, unspecified: Secondary | ICD-10-CM | POA: Diagnosis not present

## 2020-09-27 MED ORDER — HYDROCOD POLST-CPM POLST ER 10-8 MG/5ML PO SUER: 5.0000 mL | Freq: Two times a day (BID) | ORAL | 0 refills | Status: DC

## 2020-09-27 MED ORDER — EMERGEN-C IMMUNE PLUS PO PACK
1.0000 | PACK | Freq: Two times a day (BID) | ORAL | 0 refills | Status: DC
Start: 2020-09-27 — End: 2021-02-19

## 2020-09-27 NOTE — Progress Notes (Signed)
Subjective:     Patient ID: Sabrina Chapman, female   DOB: 10/04/1970, 50 y.o.   MRN: [id number]  This visit type was conducted due to national recommendations for restrictions regarding the COVID-19 Pandemic (e.g. social distancing) in an effort to limit this patient's exposure and mitigate transmission in our community.  Due to their co-morbid illnesses, this patient is at least at moderate risk for complications without adequate follow up.  This format is felt to be most appropriate for this patient at this time.    Documentation for virtual audio and video telecommunications through [facility] encounter:  The patient was located at home. The provider was located in the office. The patient did consent to this visit and is aware of possible charges through their insurance for this visit.  The other persons participating in this telemedicine service were none. Time spent on call was 20 minutes and in review of previous records 20 minutes total.  This virtual service is not related to other E/M service within previous 7 days.   HPI Chief Complaint  Patient presents with  . Covid Positive    Has body aches, headaches, cough, sore throat and runny nose. Symptoms started on 09/25/20. Patient did an at home rapid test yesterday which was positive    Virtual consult for illness.  She notes this past Saturday 09/23/20 started with a cough. Took a home covid test that was negative on Sunday. Then over the weekend cough got worse.  By Tuesday, felt crummy, sore throat, runny nose, headache, body aches.  Took another test yesterday that was positive.   Has low grade 99.9 fever last night.  No SOB or wheezing.   No NVD.  Cough is frequent but intermittent.  Using mucinex cold and flu.  Took some delsym yesterday.      Has had the pfizer vaccine and booster.   Has questions about quarantine.  Is married and 2 kids in school.  No other aggravating or relieving factors. No other  complaint.  Review of Systems As in subjective    Objective:   Physical Exam Due to coronavirus pandemic stay at home measures, patient visit was virtual and they were not examined in person.    Temp 99.9 F (37.7 C) Comment: last night  Ht 5\' 7"  (1.702 m)   Wt 140 lb (63.5 kg)   LMP 01/28/2018   BMI 21.93 kg/m   General: Well-developed well-nourished no acute distress, mildly ill-appearing No obvious shortness of breath or wheezing Answers questions in complete sentences without dyspnea      Assessment:     Encounter Diagnoses  Name Primary?  . COVID-19 virus infection Yes  . Cough   . Body aches        Plan:     She had a positive home test, she has been vaccinated. She currently has mild to moderate symptoms. We discussed supportive care, rest, hydration, medications as below, do not duplicate cough medicine below any other over-the-counter cough medications. We discussed usual timeframe to resolve. We discussed isolation.  We discussed potential complications that would warrant calling back for recheck.   General recommendations if you have respiratory symptoms: We recommend you rest, hydrate well with water and clear fluids throughout the day such as water, soup broth, ice chips, or possibly pedialyte or G2 no sugar gatorade.   You can use Tylenol over the counter for pain or fever every 4 - 6 hours You can use over the counter Delsym or mucinex  DM for cough unless your provider prescribed a cough medication already. You can use over the counter Emetrol over the counter for nausea.    Consider EmergenC Immune plus vitamin pack over the counter which contains extra vitamin C, vitamin D, and zinc.  If you are having trouble breathing, if you are very weak, have high fever 103 or higher consistently despite Tylenol, or uncontrollable nausea and vomiting, then call or go to the emergency department.    Covid symptoms such as fatigue and cough can linger over 2 weeks,  even after the initial fever, aches, chills, and other initial symptoms.   Self Quarantine and Isolation: Log onto [employer] for up to date quarantine recommendations.   [email or web address]   If you test Covid +, regardless of vaccination status:  Stay home for 5 days. If you have no symptoms or your symptoms are resolving after 5 days, then you can leave your house. Continue to wear a mask around others for 5 additional days. If you have a fever, continue to stay home until your fever resolves.  This could take 7-10 days from onset of symptoms or longer in some cases. If you have lots of coughing, sneezing, and significant runny nose and congestion, continue to stay at home until your symptoms are resolving   If you were exposed to someone with Covid: If you: Have been boosted OR Completed the primary series of Pfizer or Moderna vaccine within the last 6 months OR Completed the primary series of J&J vaccine within the last 2 months  Wear a mask around others for 10 days.  Test on day 5, if possible.   If you test positive on day 5 or more after exposure, and no symptoms, then wear a mask around others for 10 days  If you test positive and have symptoms, then follow the positive covid result isolation recommendations above If you test negative and have no symptoms on day 5 after exposure, then you may end isolation and be around others    If you were exposed to someone with Covid: If you: Completed the primary series of Pfizer or Moderna vaccine over 6 months ago and are not boosted OR Completed the primary series of J&J over 2 months ago and are not boosted OR Are unvaccinated  Stay home for 5 days. After that continue to wear a mask around others for 5 additional days. If you can't quarantine you must wear a mask for 10 days. Test on day 5 if possible. If you test positive on day 5 or more after exposure,  and no symptoms, then wear a mask around others for 10 days  If you test positive and have symptoms, then follow the positive covid result isolation recommendations above If you test negative and have no symptoms on day 5 after exposure, then you can end isolation but wear a mask around others for 5 more days   If you test Covid negative, but have respiratory symptoms:  Continue to wear a mask around others for 5 additional days. If you have a fever, continue to stay home until your fever resolves.   If you have lots of coughing, sneezing, and significant runny nose and congestion, continue to stay at home until your symptoms are resolving   Isolation means avoiding contact with people as much as possible.   Particularly in your house, isolate your self from others in a separate room, wear a mask when possible in the room, particularly if coughing a lot.  Have others bring food, water, medications, etc., to your door, but avoid direct contact with your household contacts during this time to avoid spreading the infection to them.   If you have a separate bathroom and living quarters during the next 2 weeks away from others, that would be preferable.    If you can't completely isolate, then wear a mask, wash hands frequently with soap and water for at least 15 seconds, minimize close contact with others, and have a friend or family member check regularly from a distance to make sure you are not getting seriously worse.     You should not be going out in public, should not be going to stores, to work or other public places until all your symptoms have resolved.  One of the goals is to limit spread to high risk people; people that are older and elderly, people with multiple health issues like diabetes, heart disease, lung disease, and anybody that has weakened immune systems such as people with cancer or on immunosuppressive therapy.  Sabrina Chapman was seen today for covid positive.  Diagnoses and all orders  for this visit:  COVID-19 virus infection  Cough  Body aches  Other orders -     chlorpheniramine-HYDROcodone (TUSSIONEX PENNKINETIC ER) 10-8 MG/5ML SUER; Take 5 mLs by mouth 2 (two) times daily. -     Multiple Vitamins-Minerals (EMERGEN-C IMMUNE PLUS) PACK; Take 1 tablet by mouth 2 (two) times daily.  f/u prn

## 2020-09-27 NOTE — Patient Instructions (Signed)
[facility] [phone]  General recommendations if you have respiratory symptoms: We recommend you rest, hydrate well with water and clear fluids throughout the day such as water, soup broth, ice chips, or possibly pedialyte or G2 no sugar gatorade.   You can use Tylenol over the counter for pain or fever every 4 - 6 hours You can use over the counter Delsym or mucinex DM for cough unless your provider prescribed a cough medication already. You can use over the counter Emetrol over the counter for nausea.    Consider EmergenC Immune plus vitamin pack over the counter which contains extra vitamin C, vitamin D, and zinc.  If you are having trouble breathing, if you are very weak, have high fever 103 or higher consistently despite Tylenol, or uncontrollable nausea and vomiting, then call or go to the emergency department.    Covid symptoms such as fatigue and cough can linger over 2 weeks, even after the initial fever, aches, chills, and other initial symptoms.   Self Quarantine and Isolation: Log onto CDC website for up to date quarantine recommendations.   https://www.cdc.gov/media/releases/2021/s1227-isolation-quarantine-guidance.html   If you test Covid +, regardless of vaccination status:  Stay home for 5 days. If you have no symptoms or your symptoms are resolving after 5 days, then you can leave your house. Continue to wear a mask around others for 5 additional days. If you have a fever, continue to stay home until your fever resolves.  This could take 7-10 days from onset of symptoms or longer in some cases. If you have lots of coughing, sneezing, and significant runny nose and congestion, continue to stay at home until your symptoms are resolving   If you were exposed to someone with Covid: If you: Have been boosted OR Completed the primary series of Pfizer or Moderna vaccine within the last 6 months OR Completed the primary series of J&J vaccine within the  last 2 months  Wear a mask around others for 10 days.  Test on day 5, if possible.   If you test positive on day 5 or more after exposure, and no symptoms, then wear a mask around others for 10 days  If you test positive and have symptoms, then follow the positive covid result isolation recommendations above If you test negative and have no symptoms on day 5 after exposure, then you may end isolation and be around others    If you were exposed to someone with Covid: If you: Completed the primary series of Pfizer or Moderna vaccine over 6 months ago and are not boosted OR Completed the primary series of J&J over 2 months ago and are not boosted OR Are unvaccinated  Stay home for 5 days. After that continue to wear a mask around others for 5 additional days. If you can't quarantine you must wear a mask for 10 days. Test on day 5 if possible. If you test positive on day 5 or more after exposure, and no symptoms, then wear a mask around others for 10 days  If you test positive and have symptoms, then follow the positive covid result isolation recommendations above If you test negative and have no symptoms on day 5 after exposure, then you can end isolation but wear a mask around others for 5 more days   If you test Covid negative, but have respiratory symptoms:  Continue to wear a mask around others for 5 additional days. If you have a fever, continue to stay home until your   fever resolves.   If you have lots of coughing, sneezing, and significant runny nose and congestion, continue to stay at home until your symptoms are resolving   Isolation means avoiding contact with people as much as possible.   Particularly in your house, isolate your self from others in a separate room, wear a mask when possible in the room, particularly if coughing a lot.   Have others bring food, water, medications, etc., to your door, but avoid direct contact with your household contacts during this time to  avoid spreading the infection to them.   If you have a separate bathroom and living quarters during the next 2 weeks away from others, that would be preferable.    If you can't completely isolate, then wear a mask, wash hands frequently with soap and water for at least 15 seconds, minimize close contact with others, and have a friend or family member check regularly from a distance to make sure you are not getting seriously worse.     You should not be going out in public, should not be going to stores, to work or other public places until all your symptoms have resolved.  One of the goals is to limit spread to high risk people; people that are older and elderly, people with multiple health issues like diabetes, heart disease, lung disease, and anybody that has weakened immune systems such as people with cancer or on immunosuppressive therapy.

## 2021-01-10 ENCOUNTER — Encounter: Payer: Self-pay | Admitting: Family Medicine

## 2021-02-16 ENCOUNTER — Encounter: Payer: Self-pay | Admitting: Family Medicine

## 2021-02-16 DIAGNOSIS — R350 Frequency of micturition: Secondary | ICD-10-CM

## 2021-02-16 DIAGNOSIS — R319 Hematuria, unspecified: Secondary | ICD-10-CM

## 2021-02-18 NOTE — Patient Instructions (Addendum)
HEALTH MAINTENANCE RECOMMENDATIONS:  It is recommended that you get at least 30 minutes of aerobic exercise at least 5 days/week (for weight loss, you may need as much as 60-90 minutes). This can be any activity that gets your heart rate up. This can be divided in 10-15 minute intervals if needed, but try and build up your endurance at least once a week.  Weight bearing exercise is also recommended twice weekly.  Eat a healthy diet with lots of vegetables, fruits and fiber.  "Colorful" foods have a lot of vitamins (ie green vegetables, tomatoes, red peppers, etc).  Limit sweet tea, regular sodas and alcoholic beverages, all of which has a lot of calories and sugar.  Up to 1 alcoholic drink daily may be beneficial for women (unless trying to lose weight, watch sugars).  Drink a lot of water.  Calcium recommendations are 1200-1500 mg daily (1500 mg for postmenopausal women or women without ovaries), and vitamin D 1000 IU daily.  This should be obtained from diet and/or supplements (vitamins), and calcium should not be taken all at once, but in divided doses.  Monthly self breast exams and yearly mammograms for women over the age of 92 is recommended.  Sunscreen of at least SPF 30 should be used on all sun-exposed parts of the skin when outside between the hours of 10 am and 4 pm (not just when at beach or pool, but even with exercise, golf, tennis, and yard work!)  Use a sunscreen that says "broad spectrum" so it covers both UVA and UVB rays, and make sure to reapply every 1-2 hours.  Remember to change the batteries in your smoke detectors when changing your clock times in the spring and fall. Carbon monoxide detectors are recommended for your home.  Use your seat belt every time you are in a car, and please drive safely and not be distracted with cell phones and texting while driving.  Try looking at [facility] for finding a therapist (if they work for [employer]).   Eczema--stay well  hydrated, and moisturize your skin throughout the day.  Pataday is a different over-the-counter eye drop that you can try in place of [place] to see if it is more effective.  Mindfulness-Based Stress Reduction Mindfulness-based stress reduction (MBSR) is a program that helps people learn to practice mindfulness. Mindfulness is the practice of intentionally paying attention to the present moment. MBSR focuses on developing self-awareness, which allows you to respond to life stress without judgment or negative emotions. It can be learned and practiced through techniques such as education, breathing exercises, meditation, and yoga. MBSR includes several mindfulnesstechniques in one program. MBSR works best when you understand the treatment, are willing to try new things, and can commit to spending time practicing what you learn. MBSR training may include learning about: How your emotions, thoughts, and reactions affect your body. New ways to respond to things that cause negative thoughts to start (triggers). How to notice your thoughts and let go of them. Practicing awareness of everyday things that you normally do without thinking. The techniques and goals of different types of meditation. What are the benefits of MBSR? MBSR can have many benefits, which include helping you to: Develop self-awareness. This refers to knowing and understanding yourself. Learn skills and attitudes that help you to participate in your own health care. Learn new ways to care for yourself. Be more accepting about how things are, and let things go. Be less judgmental and approach things with an  open mind. Be patient with yourself and trust yourself more. MBSR has also been shown to: Reduce negative emotions, such as depression and anxiety. Improve memory and focus. Change how you sense and approach pain. Boost your body's ability to fight infections. Help you connect better with other people. Improve your sense of  well-being. Follow these instructions at home:  Find a local in-person or online MBSR program. Set aside some time regularly for mindfulness practice. Find a mindfulness practice that works best for you. This may include one or more of the following: Meditation. Meditation involves focusing your mind on a certain thought or activity. Breathing awareness exercises. These help you to stay present by focusing on your breath. Body scan. For this practice, you lie down and pay attention to each part of your body from head to toe. You can identify tension and soreness and intentionally relax parts of your body. Yoga. Yoga involves stretching and breathing, and it can improve your ability to move and be flexible. It can also provide an experience of testing your body's limits, which can help you release stress. Mindful eating. This way of eating involves focusing on the taste, texture, color, and smell of each bite of food. Because this slows down eating and helps you feel full sooner, it can be an important part of a weight-loss plan. Find a podcast or recording that provides guidance for breathing awareness, body scan, or meditation exercises. You can listen to these any time when you have a free moment to rest without distractions. Follow your treatment plan as told by your health care provider. This may include taking regular medicines and making changes to your diet or lifestyle as recommended. How to practice mindfulness To do a basic awareness exercise: Find a comfortable place to sit. Pay attention to the present moment. Observe your thoughts, feelings, and surroundings just as they are. Avoid placing judgment on yourself, your feelings, or your surroundings. Make note of any judgment that comes up, and let it go. Your mind may wander, and that is okay. Make note of when your thoughts drift, and return your attention to the present moment. To do basic mindfulness meditation: Find a comfortable  place to sit. This may include a stable chair or a firm floor cushion. Sit upright with your back straight. Let your arms fall next to your side with your hands resting on your legs. If sitting in a chair, rest your feet flat on the floor. If sitting on a cushion, cross your legs in front of you. Keep your head in a neutral position with your chin dropped slightly. Relax your jaw and rest the tip of your tongue on the roof of your mouth. Drop your gaze to the floor. You can close your eyes if you like. Breathe normally and pay attention to your breath. Feel the air moving in and out of your nose. Feel your belly expanding and relaxing with each breath. Your mind may wander, and that is okay. Make note of when your thoughts drift, and return your attention to your breath. Avoid placing judgment on yourself, your feelings, or your surroundings. Make note of any judgment or feelings that come up, let them go, and bring your attention back to your breath. When you are ready, lift your gaze or open your eyes. Pay attention to how your body feels after the meditation. Where to find more information You can find more information about MBSR from: Your health care provider. Community-based meditation centers or programs.  Programs offered near you. Summary Mindfulness-based stress reduction (MBSR) is a program that teaches you how to intentionally pay attention to the present moment. It is used with other treatments to help you cope better with daily stress, emotions, and pain. MBSR focuses on developing self-awareness, which allows you to respond to life stress without judgment or negative emotions. MBSR programs may involve learning different mindfulness practices, such as breathing exercises, meditation, yoga, body scan, or mindful eating. Find a mindfulness practice that works best for you, and set aside time for it on a regular basis. This information is not intended to replace advice given to you by  your health care provider. Make sure you discuss any questions you have with your healthcare provider. Document Revised: 05/05/2020 Document Reviewed: 05/05/2020 Elsevier Patient Education  2022 [employer]   Eczema Eczema refers to a group of skin conditions that cause skin to become rough and inflamed. Each type of eczema has different triggers, symptoms, and treatments.Eczema of any type is usually itchy. Symptoms range from mild to severe. Eczema is not spread from person to person (is not contagious). It can appear on different parts of the body at different times. Oneperson's eczema may look different from another person's eczema. What are the causes? The exact cause of this condition is not known. However, exposure to certainenvironmental factors, irritants, and allergens can make the condition worse. What are the signs or symptoms? Symptoms of this condition depend on the type of eczema you have. The types include: Contact dermatitis. There are two kinds: Irritant contact dermatitis. This happens when something irritates the skin and causes a rash. Allergic contact dermatitis. This happens when your skin comes in contact with something you are allergic to (allergens). This can include poison ivy, chemicals, or medicines that were applied to your skin. Atopic dermatitis. This is a long-term (chronic) skin disease that keeps coming back (recurring). It is the most common type of eczema. Usual symptoms are a red rash and itchy, dry, scaly skin. It usually starts showing signs in infancy and can last through adulthood. Dyshidrotic eczema. This is a form of eczema on the hands and feet. It shows up as very itchy, fluid-filled blisters. It can affect people of any age but is more common before age 49. Hand eczema. This causes very itchy areas of skin on the palms and sides of the hands and fingers. This type of eczema is common in industrial jobs where you may be exposed to different types of  irritants. Lichen simplex chronicus. This type of eczema occurs when a person constantly scratches one area of the body. Repeated scratching of the area leads to thickened skin (lichenification). This condition can accompany other types of eczema. It is more common in adults but may also be seen in children. Nummular eczema. This is a common type of eczema that most often affects the lower legs and the backs of the hands. It typically causes an itchy, red, circular, crusty lesion (plaque). Scratching may become a habit and can cause bleeding. Nummular eczema occurs most often in middle-aged or older people. Seborrheic dermatitis. This is a common skin disease that mainly affects the scalp. It may also affect other oily areas of the body, such as the face, sides of the nose, eyebrows, ears, eyelids, and chest. It is marked by small scaling and redness of the skin (erythema). This can affect people of all ages. In infants, this condition is called cradle cap. Stasis dermatitis. This is a common skin  disease that can cause itching, scaling, and hyperpigmentation, usually on the legs and feet. It occurs most often in people who have a condition that prevents blood from being pumped through the veins in the legs (chronic venous insufficiency). Stasis dermatitis is a chronic condition that needs long-term management. How is this diagnosed? This condition may be diagnosed based on: A physical exam of your skin. Your medical history. Skin patch tests. These tests involve using patches that contain possible allergens and placing them on your back. Your health care provider will check in a few days to see if an allergic reaction occurred. How is this treated? Treatment for eczema is based on the type of eczema you have. You may be given hydrocortisone steroid medicine or antihistamines. These can relieve itching quickly and help reduce inflammation. These may be prescribed or purchased overthe counter, depending on  the strength that is needed. Follow these instructions at home: Take or apply over-the-counter and prescription medicines only as told by your health care provider. Use creams or ointments to moisturize your skin. Do not use lotions. Learn what triggers or irritates your symptoms so you can avoid these things. Treat symptom flare-ups quickly. Do not scratch your skin. This can make your rash worse. Keep all follow-up visits. This is important. Where to find more information American Academy of Dermatology: MarketingShe[employer]si National Eczema Association: nationaleczema.org The Society for Pediatric Dermatology: pedsderm.net Contact a health care provider if: You have severe itching, even with treatment. You scratch your skin regularly until it bleeds. Your rash looks different than usual. Your skin is painful, swollen, or more red than usual. You have a fever. Summary Eczema refers to a group of skin conditions that cause skin to become rough and inflamed. Each type has different triggers. Eczema of any type causes itching that may range from mild to severe. Treatment varies based on the type of eczema you have. Hydrocortisone steroid medicine or antihistamines can help with itching and inflammation. Protecting your skin is the best way to prevent eczema. Use creams or ointments to moisturize your skin. Avoid triggers and irritants. Treat flare-ups quickly. This information is not intended to replace advice given to you by your health care provider. Make sure you discuss any questions you have with your healthcare provider. Document Revised: 05/29/2020 Document Reviewed: 05/29/2020 Elsevier Patient Education  2022 [employer].

## 2021-02-18 NOTE — Progress Notes (Signed)
Chief Complaint  Patient presents with   Annual Exam    Nonfasting annual exam. Will schedule eye appt. Has some eczema on her hands. Also has dry eyes. Allergies are different this year. Bruises easily and has low energy-wonders about her iron level. Seeing Dr. Philis Pique next month and will get mammo. PHQ2-(3) [id number]).    Sabrina Chapman is a 50 y.o. female who presents for a complete physical.  She has the following concerns:  Eczema on her hands. It isn't as bad as it has been in the past.  It is always on the backs of her hands.  She has a prescription cream that works temporarily.  Lower energy more recently (6-9 months), bruising easily in the same time frame, possibly longer.  No aspirin or anti-inflammatories.  She wakes up feeling fine.  The fatigue comes in the afternoons, 3 until bedtime. Wakes up refreshed.  Sleeps 6-7 hours/night, less than in the past.  She gets up 2:30-3:30 and can't get back to sleep, a few times/week. Not usually bathroom related. Then starts thinking about what she has to do, stress component keeps her awake.  Allergies seem different this year--more in her eyes (itchy), less sneezing, runny nose. In the last 1-2 months she changed from claritin to [facility].  This helps, but not enough for her eyes.  She is using Alaway (Ketotifen), helps, but doesn't last 12 hours.  She had robotic laparoscopic total hysterectomy with BSO, TVT by Dr. Philis Pique one year ago. She had stress incontinence, mild cystocele, and family history of ovarian cancer.   The incontinence got better, but she still has urinary urgency. She takes Azo bladder control 2 BID per Dr. [name] Carl recommendation, and this helps a lot. She denies dysuria.  She had COVID-19 infection in 09/2020. Fully recovered.   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  Her  husband improved, is back to work (though she reports he can't really multitask well, so works and cooks dinner, but leaves her with a lot of other responsibilities).  Her daughter, Clyde Canterbury, is on the autism spectrum, with behavioral issues.  She gets OT and therapy.  Son has learning disabilities. 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  Turns out this job isn't less stressful, and has been worse since January.  Some things are better--finances, she no longer feels powerless.  Stress is related to not having a good Board. She tried to find a good therapist last year--met with one, wasn't a good fit, had a hard time finding someone else.  She just recently started working with an Teacher, English as a foreign language (met for the first time last week), which she thinks will be very helpful, as much of her stress is related to her job and future hopes.   She reported last year that Prozac made her feel flat emotionally.  She decreased her dose 85m.  She feels better at this dose, definitely doesn't want to taper off (as she had originally hoped).  Vitamin D deficiency:  Level was low at 24 in the past.  Treated with 12 weeks of prescription therapy. At point level was 85 (?on 5000 IU daily). She reports she cut back on the dose to 1000 IU daily.  Repeat level was 59.8  in 01/2018. She continues to take 1000 IU daily.   Immunization History  Administered Date(s) Administered   Influenza Split 07/17/2012   Influenza,inj,Quad PF,6+ Mos 09/15/2013, 07/05/2014, 05/02/2016, 08/19/2018   Influenza-Unspecified 10/14/2017, 06/02/2018, 06/17/2019   PFIZER(Purple Top)SARS-COV-2 Vaccination 11/06/2019, 11/27/2019, 06/20/2020   PPD Test 11/25/2012   Tdap 07/16/2012, 08/12/2014   Got flu shot in October at pharmacy (same day as COVID booster) Last Pap smear: 09/2017 with Dr.  Philis Pique, gets yearly exams. S/p hysterectomy 02/2020 for benign reasons Last mammogram: 11/2019, normal breast MRI 12/2019. Has appt with Dr. Philis Pique next month, will get mammo there Last colonoscopy: 07/2020 Dr. Ardis Hughs, normal.  Recheck in 10 years Last DEXA: never Dentist:  Twice yearly Ophtho: yearly, not as regularly recently Exercise: Works with a Clinical research associate 2x/week and gym 2x/week, heavy lifting.  Warms up with cardio (rower, or runs a mile). Walks with kids, weather-permitting. Gained weight but feels leaner, stronger. Runs some 5K's.    Lipid screen: Lab Results  Component Value Date   CHOL 226 (H) 02/15/2020   HDL 92 02/15/2020   LDLCALC 123 (H) 02/15/2020   TRIG 66 02/15/2020   CHOLHDL 2.5 02/15/2020     PMH, PSH, SH and FH reviewed and updated  Outpatient Encounter Medications as of 02/19/2021  Medication Sig Note   cholecalciferol (VITAMIN D3) 25 MCG (1000 UT) tablet Take 1,000 Units by mouth daily.    estradiol (ESTRACE) 0.5 MG tablet Take 0.5 mg by mouth daily.    FLUoxetine (PROZAC) 10 MG capsule Take 1 capsule (10 mg total) by mouth daily.    fluticasone (FLONASE) 50 MCG/ACT nasal spray Place 1 spray into both nostrils daily as needed for allergies.     Ketotifen Fumarate (ALAWAY OP) Apply to eye.    L-Theanine 200 MG CAPS Take 200 mg by mouth daily.     NON FORMULARY Take 1 tablet by mouth daily. 5/46/5035: [place] Metabolic Boost   Pumpkin Seed-Soy Germ (AZO BLADDER CONTROL/GO-LESS PO) Take 4 tablets by mouth as needed.    ALPRAZolam (XANAX) 0.25 MG tablet Take 1-2 tablets (0.25-0.5 mg total) by mouth 3 (three) times daily as needed for anxiety or sleep. (Patient not taking: No sig reported) 02/19/2021: 1/4 tablet very rarely   [DISCONTINUED] cetirizine (ZYRTEC) 10 MG tablet Take 10 mg by mouth daily. (Patient not taking: No sig reported)    [DISCONTINUED] chlorpheniramine-HYDROcodone (TUSSIONEX PENNKINETIC ER) 10-8 MG/5ML SUER Take 5 mLs by mouth 2 (two) times daily.     [DISCONTINUED] Multiple Vitamins-Minerals (EMERGEN-C IMMUNE PLUS) PACK Take 1 tablet by mouth 2 (two) times daily.    [DISCONTINUED] oxyCODONE-acetaminophen (PERCOCET/ROXICET) 5-325 MG tablet Take 1 tablet by mouth every 4 (four) hours as needed for severe pain. (Patient not taking: No sig reported)    [DISCONTINUED] progesterone (PROMETRIUM) 100 MG capsule Take 100 mg by mouth daily. (Patient not taking: No sig reported)    No facility-administered encounter medications on file as of 02/19/2021.   Prescription steroid cream (unsure of name)   Allergies  Allergen Reactions   Citric Acid Other (See Comments)    GI UPSET    ROS: The patient denies anorexia, fever, headaches,  vision changes, decreased hearing, ear pain, sore throat, breast concerns, chest pain, palpitations, syncope, dyspnea on exertion, cough, swelling, nausea, vomiting, diarrhea, constipation, abdominal pain, melena, hematochezia, indigestion/heartburn, hematuria, dysuria, vaginal discharge, odor or itch, genital lesions, joint pains, numbness, tingling, weakness, tremor, suspicious skin lesions, abnormal bleeding/bruising, or enlarged lymph nodes.  Hand eczema, mild flare Urinary  frequency per HPI Fatigue, bruising per HPI Eye allergies (itchy, watery), per HPI.  Other allergy symptoms are controlled with Flonase.    PHYSICAL EXAM:  BP 118/68   Pulse 68   Ht 5' 6"  (1.676 m)   Wt 147 lb (66.7 kg)   LMP 01/28/2018   BMI 23.73 kg/m   Wt Readings from Last 3 Encounters:  02/19/21 147 lb (66.7 kg)  09/27/20 140 lb (63.5 kg)  07/21/20 144 lb (65.3 kg)    General Appearance:    Alert, cooperative, no distress, appears stated age  Head:    Normocephalic, without obvious abnormality, atraumatic  Eyes:    PERRL, conjunctiva/corneas clear, EOM's intact, fundi benign  Ears:    Normal TM's and external ear canals  Nose:   Not examined, wearing mask due to COVID-19 pandemic  Throat:   Not examined, wearing mask due to  COVID-19 pandemic  Neck:   Supple, no lymphadenopathy;  thyroid:  no enlargement/ tenderness/nodules; no carotid bruit or JVD  Back:    Spine nontender, no curvature, ROM normal, no CVA  tenderness  Lungs:     Clear to auscultation bilaterally without wheezes, rales or  ronchi; respirations unlabored  Chest Wall:    No tenderness or deformity   Heart:    Regular rate and rhythm, S1 and S2 normal, no murmur, rub or gallop  Breast Exam:    Deferred to GYN  Abdomen:     Soft, non-tender, nondistended, normoactive bowel sounds, no masses, no hepatosplenomegaly  Genitalia:    Deferred to GYN       Extremities:   No clubbing, cyanosis or edema.   Pulses:   2+ and symmetric all extremities  Skin:   Skin color, texture, turgor normal. Scattered focal dry patches on the dorsum of both hands, near MCPs.  Lymph nodes:   Cervical, supraclavicular, and inguinal nodes normal  Neurologic:   Normal strength, sensation and gait; reflexes 2+ and symmetric throughout                                Psych:   Normal mood, affect, hygiene and grooming  PHQ-9 score of 8    ASSESSMENT/PLAN:  Annual physical exam - Plan: Comprehensive metabolic panel, CBC with Differential/Platelet, VITAMIN D 25 Hydroxy (Vit-D Deficiency, Fractures), TSH, Hepatitis C antibody, POCT Urinalysis DIP (Proadvantage Device)  Fatigue, unspecified type - Check labs. Also some interrupted and inadequate length of sleep. - Plan: Comprehensive metabolic panel, CBC with Differential/Platelet, VITAMIN D 25 Hydroxy (Vit-D Deficiency, Fractures), TSH  Need for hepatitis C screening test - Plan: Hepatitis C antibody  Vitamin D deficiency - Plan: VITAMIN D 25 Hydroxy (Vit-D Deficiency, Fractures)  Easy bruising - Plan: CBC with Differential/Platelet  Depression, major, single episode, in partial remission ([place]) - encouraged counseling. Declined increasing prozac dose at this time or other med changes. Most stress is related to job, hoping  executive coaching helps  Insomnia, unspecified type - early awakening.  Discussed potential causes (including alcohol, anxiety/stress); mindfulness/visualization techniques reviewed  Urinary frequency - chronic since surgery, and relieved by otc AZO per GYN.  Urine shows blood/leuks.  Will eval for infection  Hand eczema - mild; discussed measures to treat, various moisturizers, hydration, avoidance of hand sanitizer (use soap); use rx steroid she has BID prn   Hep C  Ab, TSH, CBC, c-met, D Urine results seen after patient gone and urine sample discarded.  To  return for lab u/a with microscopic and urine culture.   Discussed monthly self breast exams and yearly mammograms; at least 30 minutes of aerobic activity at least 5 days/week, weight-bearing exercise 2-3x/wk; proper sunscreen use reviewed; healthy diet, including goals of calcium and vitamin D intake and alcohol recommendations (less than or equal to 1 drink/day) reviewed; regular seatbelt use; changing batteries in smoke detectors.  Immunization recommendations discussed--continue yearly flu shots. Shingrix age 89. Colonoscopy recommendations reviewed, UTD.  F/u 1 year, sooner prn.

## 2021-02-19 ENCOUNTER — Ambulatory Visit (INDEPENDENT_AMBULATORY_CARE_PROVIDER_SITE_OTHER): Payer: Managed Care, Other (non HMO) | Admitting: Family Medicine

## 2021-02-19 ENCOUNTER — Encounter: Payer: Self-pay | Admitting: Family Medicine

## 2021-02-19 ENCOUNTER — Other Ambulatory Visit: Payer: Self-pay

## 2021-02-19 VITALS — BP 118/68 | HR 68 | Ht 66.0 in | Wt 147.0 lb

## 2021-02-19 DIAGNOSIS — Z0000 Encounter for general adult medical examination without abnormal findings: Secondary | ICD-10-CM

## 2021-02-19 DIAGNOSIS — R233 Spontaneous ecchymoses: Secondary | ICD-10-CM

## 2021-02-19 DIAGNOSIS — G4700 Insomnia, unspecified: Secondary | ICD-10-CM

## 2021-02-19 DIAGNOSIS — R5383 Other fatigue: Secondary | ICD-10-CM

## 2021-02-19 DIAGNOSIS — E559 Vitamin D deficiency, unspecified: Secondary | ICD-10-CM | POA: Diagnosis not present

## 2021-02-19 DIAGNOSIS — L309 Dermatitis, unspecified: Secondary | ICD-10-CM

## 2021-02-19 DIAGNOSIS — R350 Frequency of micturition: Secondary | ICD-10-CM

## 2021-02-19 DIAGNOSIS — Z1159 Encounter for screening for other viral diseases: Secondary | ICD-10-CM | POA: Diagnosis not present

## 2021-02-19 DIAGNOSIS — F324 Major depressive disorder, single episode, in partial remission: Secondary | ICD-10-CM | POA: Diagnosis not present

## 2021-02-19 DIAGNOSIS — R238 Other skin changes: Secondary | ICD-10-CM | POA: Diagnosis not present

## 2021-02-19 LAB — POCT URINALYSIS DIP (PROADVANTAGE DEVICE)
Bilirubin, UA: NEGATIVE
Glucose, UA: NEGATIVE mg/dL
Ketones, POC UA: NEGATIVE mg/dL
Nitrite, UA: NEGATIVE
Specific Gravity, Urine: 1.015
Urobilinogen, Ur: NEGATIVE
pH, UA: 6.5 (ref 5.0–8.0)

## 2021-02-20 ENCOUNTER — Other Ambulatory Visit (INDEPENDENT_AMBULATORY_CARE_PROVIDER_SITE_OTHER): Payer: Managed Care, Other (non HMO)

## 2021-02-20 DIAGNOSIS — R319 Hematuria, unspecified: Secondary | ICD-10-CM

## 2021-02-20 DIAGNOSIS — Z0000 Encounter for general adult medical examination without abnormal findings: Secondary | ICD-10-CM | POA: Diagnosis not present

## 2021-02-20 DIAGNOSIS — R350 Frequency of micturition: Secondary | ICD-10-CM

## 2021-02-20 LAB — POCT URINALYSIS DIP (CLINITEK)
Bilirubin, UA: NEGATIVE
Blood, UA: NEGATIVE
Glucose, UA: NEGATIVE mg/dL
Ketones, POC UA: NEGATIVE mg/dL
Nitrite, UA: NEGATIVE
POC PROTEIN,UA: NEGATIVE
Spec Grav, UA: 1.015 (ref 1.010–1.025)
Urobilinogen, UA: 0.2 E.U./dL
pH, UA: 7.5 (ref 5.0–8.0)

## 2021-02-20 LAB — TSH: TSH: 0.921 u[IU]/mL (ref 0.450–4.500)

## 2021-02-20 LAB — CBC WITH DIFFERENTIAL/PLATELET
Basophils Absolute: 0 10*3/uL (ref 0.0–0.2)
Basos: 0 %
EOS (ABSOLUTE): 0.1 10*3/uL (ref 0.0–0.4)
Eos: 3 %
Hematocrit: 37.5 % (ref 34.0–46.6)
Hemoglobin: 12.5 g/dL (ref 11.1–15.9)
Immature Grans (Abs): 0 10*3/uL (ref 0.0–0.1)
Immature Granulocytes: 0 %
Lymphocytes Absolute: 1.1 10*3/uL (ref 0.7–3.1)
Lymphs: 25 %
MCH: 31.3 pg (ref 26.6–33.0)
MCHC: 33.3 g/dL (ref 31.5–35.7)
MCV: 94 fL (ref 79–97)
Monocytes Absolute: 0.5 10*3/uL (ref 0.1–0.9)
Monocytes: 10 %
Neutrophils Absolute: 2.8 10*3/uL (ref 1.4–7.0)
Neutrophils: 62 %
Platelets: 288 10*3/uL (ref 150–450)
RBC: 4 x10E6/uL (ref 3.77–5.28)
RDW: 12.1 % (ref 11.7–15.4)
WBC: 4.5 10*3/uL (ref 3.4–10.8)

## 2021-02-20 LAB — COMPREHENSIVE METABOLIC PANEL
ALT: 15 IU/L (ref 0–32)
AST: 23 IU/L (ref 0–40)
Albumin/Globulin Ratio: 1.6 (ref 1.2–2.2)
Albumin: 4.1 g/dL (ref 3.8–4.8)
Alkaline Phosphatase: 83 IU/L (ref 44–121)
BUN/Creatinine Ratio: 18 (ref 9–23)
BUN: 15 mg/dL (ref 6–24)
Bilirubin Total: 0.5 mg/dL (ref 0.0–1.2)
CO2: 22 mmol/L (ref 20–29)
Calcium: 9.4 mg/dL (ref 8.7–10.2)
Chloride: 103 mmol/L (ref 96–106)
Creatinine, Ser: 0.85 mg/dL (ref 0.57–1.00)
Globulin, Total: 2.6 g/dL (ref 1.5–4.5)
Glucose: 89 mg/dL (ref 65–99)
Potassium: 4.2 mmol/L (ref 3.5–5.2)
Sodium: 143 mmol/L (ref 134–144)
Total Protein: 6.7 g/dL (ref 6.0–8.5)
eGFR: 84 mL/min/{1.73_m2} (ref >59)

## 2021-02-20 LAB — VITAMIN D 25 HYDROXY (VIT D DEFICIENCY, FRACTURES): Vit D, 25-Hydroxy: 47.5 ng/mL (ref 30.0–100.0)

## 2021-02-20 LAB — HEPATITIS C ANTIBODY: Hep C Virus Ab: <0.1 s/co ratio (ref 0.0–0.9)

## 2021-02-22 ENCOUNTER — Encounter: Payer: Self-pay | Admitting: Family Medicine

## 2021-02-22 LAB — URINALYSIS, ROUTINE W REFLEX MICROSCOPIC
Bilirubin, UA: NEGATIVE
Glucose, UA: NEGATIVE
Ketones, UA: NEGATIVE
Nitrite, UA: NEGATIVE
Protein,UA: NEGATIVE
Specific Gravity, UA: 1.014 (ref 1.005–1.030)
Urobilinogen, Ur: 0.2 mg/dL (ref 0.2–1.0)
pH, UA: 7.5 (ref 5.0–7.5)

## 2021-02-22 LAB — MICROSCOPIC EXAMINATION: Casts: NONE SEEN /lpf

## 2021-02-23 LAB — URINE CULTURE

## 2021-02-24 ENCOUNTER — Encounter: Payer: Self-pay | Admitting: Family Medicine

## 2021-02-24 DIAGNOSIS — R350 Frequency of micturition: Secondary | ICD-10-CM

## 2021-02-24 DIAGNOSIS — R3129 Other microscopic hematuria: Secondary | ICD-10-CM

## 2021-02-28 ENCOUNTER — Other Ambulatory Visit: Payer: Self-pay | Admitting: Family Medicine

## 2021-02-28 DIAGNOSIS — F324 Major depressive disorder, single episode, in partial remission: Secondary | ICD-10-CM

## 2021-02-28 NOTE — Telephone Encounter (Signed)
Is this okay to refill for a year? 

## 2021-03-07 LAB — RESULTS CONSOLE HPV: CHL HPV: NEGATIVE

## 2021-03-07 LAB — HM PAP SMEAR: HM Pap smear: NORMAL

## 2021-03-14 ENCOUNTER — Encounter: Payer: Self-pay | Admitting: Family Medicine

## 2021-05-30 ENCOUNTER — Encounter (INDEPENDENT_AMBULATORY_CARE_PROVIDER_SITE_OTHER): Payer: Self-pay

## 2021-05-30 ENCOUNTER — Other Ambulatory Visit: Payer: Self-pay | Admitting: Urology

## 2021-05-31 ENCOUNTER — Ambulatory Visit: Payer: 59 | Admitting: Psychologist

## 2021-06-14 ENCOUNTER — Ambulatory Visit: Payer: 59 | Admitting: Psychologist

## 2021-06-28 ENCOUNTER — Ambulatory Visit: Payer: 59 | Admitting: Psychologist

## 2021-07-12 ENCOUNTER — Ambulatory Visit: Payer: 59 | Admitting: Psychologist

## 2021-07-24 NOTE — Patient Instructions (Signed)
DUE TO COVID-19 ONLY ONE VISITOR IS ALLOWED TO COME WITH YOU AND STAY IN THE WAITING ROOM ONLY DURING PRE OP AND PROCEDURE DAY OF SURGERY IF YOU ARE GOING HOME AFTER SURGERY. IF YOU ARE SPENDING THE NIGHT 2 PEOPLE MAY VISIT WITH YOU IN YOUR PRIVATE ROOM AFTER SURGERY UNTIL VISITING  HOURS ARE OVER AT 800 PM AND 1  VISITOR  MAY  SPEND THE NIGHT.                 Sabrina Chapman     Your procedure is scheduled on: 08/01/21   Report to [facility] Main  Entrance   Report to admitting at 6:15AM     Call this number if you have problems the morning of surgery [phone]    Remember: Do not eat food  or drink:After Midnight the night before your surgery,          BRUSH YOUR TEETH MORNING OF SURGERY AND RINSE YOUR MOUTH OUT, NO CHEWING GUM CANDY OR MINTS.     Take these medicines the morning of surgery with A SIP OF WATER: Prozac Y                               You may not have any metal on your body including hair pins and              piercings  Do not wear jewelry, make-up, lotions, powders or perfumes, deodorant             Do not wear nail polish on your fingernails.  Do not shave  48 hours prior to surgery.                Do not bring valuables to the hospital. [facility] IS NOT             RESPONSIBLE   FOR VALUABLES.  Contacts, dentures or bridgework may not be worn into surgery.     Patients discharged the day of surgery will not be allowed to drive home.  IF YOU ARE HAVING SURGERY AND GOING HOME THE SAME DAY, YOU MUST HAVE AN ADULT TO DRIVE YOU HOME AND BE WITH YOU FOR 24 HOURS. YOU MAY GO HOME BY TAXI OR UBER OR ORTHERWISE, BUT AN ADULT MUST ACCOMPANY YOU HOME AND STAY WITH YOU FOR 24 HOURS.  Name and phone number of your driver:  Special Instructions: N/A              Please read over the following fact sheets you were given: _____________________________________________________________________             [facility] - Preparing for  Surgery Before surgery, you can play an important role.  Because skin is not sterile, your skin needs to be as free of germs as possible.  You can reduce the number of germs on your skin by washing with CHG (chlorahexidine gluconate) soap before surgery.  CHG is an antiseptic cleaner which kills germs and bonds with the skin to continue killing germs even after washing. Please DO NOT use if you have an allergy to CHG or antibacterial soaps.  If your skin becomes reddened/irritated stop using the CHG and inform your nurse when you arrive at Short Stay. Do not shave (including legs and underarms) for at least 48 hours prior to the first CHG shower Please follow these instructions carefully:  1.  Shower with CHG Soap  the night before surgery and the  morning of Surgery.  2.  If you choose to wash your hair, wash your hair first as usual with your  normal  shampoo.  3.  After you shampoo, rinse your hair and body thoroughly to remove the  shampoo.                            4.  Use CHG as you would any other liquid soap.  You can apply chg directly  to the skin and wash                       Gently with a scrungie or clean washcloth.  5.  Apply the CHG Soap to your body ONLY FROM THE NECK DOWN.   Do not use on face/ open                           Wound or open sores. Avoid contact with eyes, ears mouth and genitals (private parts).                       Wash face,  Genitals (private parts) with your normal soap.             6.  Wash thoroughly, paying special attention to the area where your surgery  will be performed.  7.  Thoroughly rinse your body with warm water from the neck down.  8.  DO NOT shower/wash with your normal soap after using and rinsing off  the CHG Soap.                9.  Pat yourself dry with a clean towel.            10.  Wear clean pajamas.            11.  Place clean sheets on your bed the night of your first shower and do not  sleep with pets. Day of Surgery : Do not apply  any lotions/deodorants the morning of surgery.  Please wear clean clothes to the hospital/surgery center.  FAILURE TO FOLLOW THESE INSTRUCTIONS MAY RESULT IN THE CANCELLATION OF YOUR SURGERY PATIENT SIGNATURE_________________________________  NURSE SIGNATURE__________________________________  ________________________________________________________________________

## 2021-07-25 ENCOUNTER — Other Ambulatory Visit: Payer: Self-pay

## 2021-07-25 ENCOUNTER — Encounter (HOSPITAL_COMMUNITY)
Admission: RE | Admit: 2021-07-25 | Discharge: 2021-07-25 | Disposition: A | Discharge status: Home / Self Care | Payer: Managed Care, Other (non HMO) | Source: Ambulatory Visit | Attending: Urology | Admitting: Urology

## 2021-07-25 ENCOUNTER — Encounter (HOSPITAL_COMMUNITY): Payer: Self-pay

## 2021-07-25 DIAGNOSIS — Z01812 Encounter for preprocedural laboratory examination: Secondary | ICD-10-CM | POA: Insufficient documentation

## 2021-07-25 NOTE — Progress Notes (Signed)
COVID test- NA    PCP - Dr. Benedetto Coons Cardiologist - none   Chest x-ray - no EKG - no Stress Test - no ECHO - no Cardiac Cath - no Pacemaker/ICD device last checked:NA  Sleep Study - no CPAP -   Fasting Blood Sugar - NA Checks Blood Sugar _____ times a day  Blood Thinner Instructions:NA Aspirin Instructions: Last Dose:  Anesthesia review: no  Patient denies shortness of breath, fever, cough and chest pain at PAT appointment Pt has no SOB with any activities  Patient verbalized understanding of instructions that were given to them at the PAT appointment. Patient was also instructed that they will need to review over the PAT instructions again at home before surgery. yes

## 2021-08-01 ENCOUNTER — Encounter (HOSPITAL_COMMUNITY): Payer: Self-pay | Admitting: Urology

## 2021-08-01 ENCOUNTER — Encounter (HOSPITAL_COMMUNITY)
Admission: RE | Disposition: A | Discharge status: Home / Self Care | Payer: Self-pay | Source: Home / Self Care | Attending: Urology

## 2021-08-01 ENCOUNTER — Ambulatory Visit (HOSPITAL_COMMUNITY): Payer: Managed Care, Other (non HMO) | Admitting: Certified Registered Nurse Anesthetist

## 2021-08-01 ENCOUNTER — Observation Stay (HOSPITAL_COMMUNITY)
Admission: RE | Admit: 2021-08-01 | Discharge: 2021-08-02 | Disposition: A | Discharge status: Home / Self Care | Payer: Managed Care, Other (non HMO) | Attending: Urology | Admitting: Urology

## 2021-08-01 DIAGNOSIS — T83712A Erosion of implanted urethral mesh to surrounding organ or tissue, initial encounter: Secondary | ICD-10-CM | POA: Diagnosis present

## 2021-08-01 DIAGNOSIS — Y828 Other medical devices associated with adverse incidents: Secondary | ICD-10-CM | POA: Diagnosis not present

## 2021-08-01 DIAGNOSIS — T83718A Erosion of other implanted mesh and other prosthetic materials to surrounding organ or tissue, initial encounter: Secondary | ICD-10-CM | POA: Diagnosis present

## 2021-08-01 DIAGNOSIS — Z87891 Personal history of nicotine dependence: Secondary | ICD-10-CM | POA: Insufficient documentation

## 2021-08-01 DIAGNOSIS — R319 Hematuria, unspecified: Secondary | ICD-10-CM | POA: Insufficient documentation

## 2021-08-01 HISTORY — PX: CYSTOSCOPY WITH INJECTION: SHX1424

## 2021-08-01 HISTORY — PX: ROBOT ASSISTED LAPAROSCOPIC COMPLETE CYSTECT ILEAL CONDUIT: SHX5139

## 2021-08-01 LAB — HEMOGLOBIN AND HEMATOCRIT, BLOOD
HCT: 38.9 % (ref 36.0–46.0)
Hemoglobin: 12.5 g/dL (ref 12.0–15.0)

## 2021-08-01 SURGERY — CYSTECTOMY, ROBOT-ASSISTED, WITH ILEAL CONDUIT CREATION
Anesthesia: General | Site: Bladder

## 2021-08-01 MED ORDER — OXYCODONE HCL 5 MG PO TABS
5.0000 mg | ORAL_TABLET | Freq: Once | ORAL | Status: AC | PRN
  Administered 2021-08-01: 5 mg via ORAL

## 2021-08-01 MED ORDER — HYDROCODONE-ACETAMINOPHEN 5-325 MG PO TABS: 1.0000 | ORAL_TABLET | Freq: Four times a day (QID) | ORAL | 0 refills | Status: DC | PRN

## 2021-08-01 MED ORDER — MIDAZOLAM HCL 5 MG/5ML IJ SOLN
INTRAMUSCULAR | Status: DC | PRN
  Administered 2021-08-01: 2 mg via INTRAVENOUS

## 2021-08-01 MED ORDER — SODIUM CHLORIDE (PF) 0.9 % IJ SOLN
INTRAMUSCULAR | Status: AC
  Filled 2021-08-01: qty 20

## 2021-08-01 MED ORDER — STERILE WATER FOR IRRIGATION IR SOLN
Status: DC | PRN
  Administered 2021-08-01: 1000 mL

## 2021-08-01 MED ORDER — ONDANSETRON HCL 4 MG/2ML IJ SOLN
INTRAMUSCULAR | Status: DC | PRN
  Administered 2021-08-01: 4 mg via INTRAVENOUS

## 2021-08-01 MED ORDER — SCOPOLAMINE 1 MG/3DAYS TD PT72
MEDICATED_PATCH | TRANSDERMAL | Status: AC
  Filled 2021-08-01: qty 1

## 2021-08-01 MED ORDER — PROPOFOL 10 MG/ML IV BOLUS
INTRAVENOUS | Status: DC | PRN
  Administered 2021-08-01: 150 mg via INTRAVENOUS

## 2021-08-01 MED ORDER — ACETAMINOPHEN 10 MG/ML IV SOLN: 1000.0000 mg | Freq: Once | INTRAVENOUS | Status: DC | PRN

## 2021-08-01 MED ORDER — BUPIVACAINE LIPOSOME 1.3 % IJ SUSP
INTRAMUSCULAR | Status: AC
  Filled 2021-08-01: qty 20

## 2021-08-01 MED ORDER — NAPHAZOLINE-PHENIRAMINE 0.025-0.3 % OP SOLN
1.0000 [drp] | Freq: Four times a day (QID) | OPHTHALMIC | Status: DC | PRN
  Filled 2021-08-01 (×2): qty 15

## 2021-08-01 MED ORDER — ACETAMINOPHEN 10 MG/ML IV SOLN
INTRAVENOUS | Status: AC
  Administered 2021-08-01: 1000 mg via INTRAVENOUS
  Filled 2021-08-01: qty 100

## 2021-08-01 MED ORDER — LACTATED RINGERS IV SOLN: INTRAVENOUS | Status: DC

## 2021-08-01 MED ORDER — DEXAMETHASONE SODIUM PHOSPHATE 10 MG/ML IJ SOLN
INTRAMUSCULAR | Status: DC | PRN
Start: 2021-08-01 — End: 2021-08-01
  Administered 2021-08-01: 10 mg via INTRAVENOUS

## 2021-08-01 MED ORDER — CEFAZOLIN SODIUM-DEXTROSE 1-4 GM/50ML-% IV SOLN
1.0000 g | Freq: Three times a day (TID) | INTRAVENOUS | Status: AC
  Administered 2021-08-01 – 2021-08-02 (×2): 1 g via INTRAVENOUS
  Filled 2021-08-01 (×2): qty 50

## 2021-08-01 MED ORDER — SODIUM CHLORIDE 0.9 % IR SOLN
Status: DC | PRN
  Administered 2021-08-01: 1000 mL

## 2021-08-01 MED ORDER — SUGAMMADEX SODIUM 200 MG/2ML IV SOLN
INTRAVENOUS | Status: DC | PRN
  Administered 2021-08-01: 200 mg via INTRAVENOUS

## 2021-08-01 MED ORDER — CHLORHEXIDINE GLUCONATE 0.12 % MT SOLN
15.0000 mL | Freq: Once | OROMUCOSAL | Status: AC
  Administered 2021-08-01: 15 mL via OROMUCOSAL

## 2021-08-01 MED ORDER — ESTRADIOL 1 MG PO TABS
0.5000 mg | ORAL_TABLET | Freq: Every day | ORAL | Status: DC
  Administered 2021-08-01 – 2021-08-02 (×2): 0.5 mg via ORAL
  Filled 2021-08-01 (×2): qty 0.5

## 2021-08-01 MED ORDER — BELLADONNA ALKALOIDS-OPIUM 16.2-30 MG RE SUPP: 1.0000 | Freq: Four times a day (QID) | RECTAL | Status: DC | PRN

## 2021-08-01 MED ORDER — OXYCODONE HCL 5 MG PO TABS
5.0000 mg | ORAL_TABLET | ORAL | Status: DC | PRN
  Administered 2021-08-01 – 2021-08-02 (×3): 5 mg via ORAL
  Filled 2021-08-01 (×3): qty 1

## 2021-08-01 MED ORDER — BUPIVACAINE LIPOSOME 1.3 % IJ SUSP
INTRAMUSCULAR | Status: DC | PRN
  Administered 2021-08-01: 20 mL

## 2021-08-01 MED ORDER — HYDROMORPHONE HCL 1 MG/ML IJ SOLN
0.2500 mg | INTRAMUSCULAR | Status: DC | PRN
Start: 2021-08-01 — End: 2021-08-01
  Administered 2021-08-01 (×2): 0.5 mg via INTRAVENOUS

## 2021-08-01 MED ORDER — DEXTROSE-NACL 5-0.45 % IV SOLN: INTRAVENOUS | Status: DC

## 2021-08-01 MED ORDER — ROCURONIUM BROMIDE 100 MG/10ML IV SOLN
INTRAVENOUS | Status: DC | PRN
  Administered 2021-08-01: 20 mg via INTRAVENOUS
  Administered 2021-08-01: 70 mg via INTRAVENOUS

## 2021-08-01 MED ORDER — SENNOSIDES-DOCUSATE SODIUM 8.6-50 MG PO TABS
2.0000 | ORAL_TABLET | Freq: Every day | ORAL | Status: DC
  Administered 2021-08-01: 2 via ORAL
  Filled 2021-08-01: qty 2

## 2021-08-01 MED ORDER — DOCUSATE SODIUM 100 MG PO CAPS: 100.0000 mg | ORAL_CAPSULE | Freq: Two times a day (BID) | ORAL | Status: DC

## 2021-08-01 MED ORDER — LIDOCAINE HCL (CARDIAC) PF 100 MG/5ML IV SOSY
PREFILLED_SYRINGE | INTRAVENOUS | Status: DC | PRN
  Administered 2021-08-01: 100 mg via INTRAVENOUS

## 2021-08-01 MED ORDER — ROCURONIUM BROMIDE 10 MG/ML (PF) SYRINGE
PREFILLED_SYRINGE | INTRAVENOUS | Status: AC
  Filled 2021-08-01: qty 10

## 2021-08-01 MED ORDER — POLYETHYLENE GLYCOL 3350 17 GM/SCOOP PO POWD: 1.0000 | Freq: Once | ORAL | Status: DC

## 2021-08-01 MED ORDER — DIPHENHYDRAMINE HCL 50 MG/ML IJ SOLN: 12.5000 mg | Freq: Four times a day (QID) | INTRAMUSCULAR | Status: DC | PRN

## 2021-08-01 MED ORDER — FENTANYL CITRATE (PF) 100 MCG/2ML IJ SOLN
INTRAMUSCULAR | Status: DC | PRN
  Administered 2021-08-01: 50 ug via INTRAVENOUS
  Administered 2021-08-01: 100 ug via INTRAVENOUS
  Administered 2021-08-01 (×2): 50 ug via INTRAVENOUS

## 2021-08-01 MED ORDER — FENTANYL CITRATE (PF) 250 MCG/5ML IJ SOLN
INTRAMUSCULAR | Status: AC
  Filled 2021-08-01: qty 5

## 2021-08-01 MED ORDER — MIDAZOLAM HCL 2 MG/2ML IJ SOLN
INTRAMUSCULAR | Status: AC
  Filled 2021-08-01: qty 2

## 2021-08-01 MED ORDER — HYDROMORPHONE HCL 1 MG/ML IJ SOLN
0.5000 mg | INTRAMUSCULAR | Status: DC | PRN
  Administered 2021-08-01: 1 mg via INTRAVENOUS
  Administered 2021-08-02: 0.5 mg via INTRAVENOUS
  Filled 2021-08-01 (×2): qty 1

## 2021-08-01 MED ORDER — OXYCODONE HCL 5 MG/5ML PO SOLN: 5.0000 mg | Freq: Once | ORAL | Status: AC | PRN

## 2021-08-01 MED ORDER — DIPHENHYDRAMINE HCL 12.5 MG/5ML PO ELIX: 12.5000 mg | ORAL_SOLUTION | Freq: Four times a day (QID) | ORAL | Status: DC | PRN

## 2021-08-01 MED ORDER — ONDANSETRON HCL 4 MG/2ML IJ SOLN: 4.0000 mg | INTRAMUSCULAR | Status: DC | PRN

## 2021-08-01 MED ORDER — CEFAZOLIN SODIUM-DEXTROSE 2-4 GM/100ML-% IV SOLN
2.0000 g | INTRAVENOUS | Status: AC
  Administered 2021-08-01: 2 g via INTRAVENOUS
  Filled 2021-08-01: qty 100

## 2021-08-01 MED ORDER — ACETAMINOPHEN 500 MG PO TABS
1000.0000 mg | ORAL_TABLET | Freq: Four times a day (QID) | ORAL | Status: DC
  Administered 2021-08-01 – 2021-08-02 (×3): 1000 mg via ORAL
  Filled 2021-08-01 (×3): qty 2

## 2021-08-01 MED ORDER — PROMETHAZINE HCL 25 MG/ML IJ SOLN
6.2500 mg | INTRAMUSCULAR | Status: DC | PRN
Start: 2021-08-01 — End: 2021-08-01

## 2021-08-01 MED ORDER — DEXAMETHASONE SODIUM PHOSPHATE 10 MG/ML IJ SOLN
INTRAMUSCULAR | Status: AC
  Filled 2021-08-01: qty 1

## 2021-08-01 MED ORDER — FLUOXETINE HCL 10 MG PO CAPS
10.0000 mg | ORAL_CAPSULE | Freq: Every day | ORAL | Status: DC
  Administered 2021-08-02: 10 mg via ORAL
  Filled 2021-08-01: qty 1

## 2021-08-01 MED ORDER — HYDROMORPHONE HCL 1 MG/ML IJ SOLN
INTRAMUSCULAR | Status: AC
  Administered 2021-08-01: 0.5 mg via INTRAVENOUS
  Filled 2021-08-01: qty 2

## 2021-08-01 MED ORDER — OXYCODONE HCL 5 MG PO TABS
ORAL_TABLET | ORAL | Status: AC
  Filled 2021-08-01: qty 1

## 2021-08-01 MED ORDER — ORAL CARE MOUTH RINSE: 15.0000 mL | Freq: Once | OROMUCOSAL | Status: AC

## 2021-08-01 MED ORDER — SULFAMETHOXAZOLE-TRIMETHOPRIM 800-160 MG PO TABS: 1.0000 | ORAL_TABLET | Freq: Two times a day (BID) | ORAL | 0 refills | Status: DC

## 2021-08-01 MED ORDER — SODIUM CHLORIDE (PF) 0.9 % IJ SOLN
INTRAMUSCULAR | Status: DC | PRN
  Administered 2021-08-01: 20 mL

## 2021-08-01 SURGICAL SUPPLY — 69 items
APPLICATOR COTTON TIP 6 STRL (MISCELLANEOUS) ×2 IMPLANT
APPLICATOR COTTON TIP 6IN STRL (MISCELLANEOUS) ×3
BAG COUNTER SPONGE SURGICOUNT (BAG) IMPLANT
BAG URO CATCHER STRL LF (MISCELLANEOUS) ×3 IMPLANT
CATH FOLEY 2WAY SLVR 18FR 30CC (CATHETERS) ×3 IMPLANT
CHLORAPREP W/TINT 26 (MISCELLANEOUS) ×3 IMPLANT
CLIP LIGATING HEM O LOK PURPLE (MISCELLANEOUS) ×6 IMPLANT
CLIP LIGATING HEMO O LOK GREEN (MISCELLANEOUS) IMPLANT
CLOTH BEACON ORANGE TIMEOUT ST (SAFETY) ×3 IMPLANT
CONNECTOR CATH FOLEY FEMALE LL (CATHETERS) IMPLANT
COVER SURGICAL LIGHT HANDLE (MISCELLANEOUS) ×3 IMPLANT
COVER TIP SHEARS 8 DVNC (MISCELLANEOUS) ×2 IMPLANT
COVER TIP SHEARS 8MM DA VINCI (MISCELLANEOUS) ×3
CUTTER ECHEON FLEX ENDO 45 340 (ENDOMECHANICALS) IMPLANT
DECANTER SPIKE VIAL GLASS SM (MISCELLANEOUS) ×3 IMPLANT
DERMABOND ADVANCED (GAUZE/BANDAGES/DRESSINGS) ×1
DERMABOND ADVANCED .7 DNX12 (GAUZE/BANDAGES/DRESSINGS) ×2 IMPLANT
DRAIN CHANNEL RND F F (WOUND CARE) IMPLANT
DRAPE ARM DVNC X/XI (DISPOSABLE) ×8 IMPLANT
DRAPE COLUMN DVNC XI (DISPOSABLE) ×2 IMPLANT
DRAPE DA VINCI XI ARM (DISPOSABLE) ×12
DRAPE DA VINCI XI COLUMN (DISPOSABLE) ×3
DRSG TEGADERM 4X4.75 (GAUZE/BANDAGES/DRESSINGS) ×3 IMPLANT
ELECT REM PT RETURN 15FT ADLT (MISCELLANEOUS) ×3 IMPLANT
GAUZE 4X4 16PLY ~~LOC~~+RFID DBL (SPONGE) IMPLANT
GLOVE SURG ENC MOIS LTX SZ6.5 (GLOVE) ×3 IMPLANT
GLOVE SURG ENC TEXT LTX SZ7.5 (GLOVE) ×6 IMPLANT
GOWN STRL REUS W/TWL LRG LVL3 (GOWN DISPOSABLE) ×9 IMPLANT
HOLDER FOLEY CATH W/STRAP (MISCELLANEOUS) ×3 IMPLANT
IRRIG SUCT STRYKERFLOW 2 WTIP (MISCELLANEOUS) ×3
IRRIGATION SUCT STRKRFLW 2 WTP (MISCELLANEOUS) ×2 IMPLANT
KIT PROCEDURE DA VINCI SI (MISCELLANEOUS)
KIT PROCEDURE DVNC SI (MISCELLANEOUS) IMPLANT
KIT TURNOVER KIT A (KITS) IMPLANT
MANIFOLD NEPTUNE II (INSTRUMENTS) ×3 IMPLANT
NEEDLE ASPIRATION 22 (NEEDLE) ×3 IMPLANT
NEEDLE INSUFFLATION 14GA 120MM (NEEDLE) ×3 IMPLANT
PACK CYSTO (CUSTOM PROCEDURE TRAY) ×3 IMPLANT
PACK ROBOT UROLOGY CUSTOM (CUSTOM PROCEDURE TRAY) ×3 IMPLANT
PAD POSITIONING PINK XL (MISCELLANEOUS) ×3 IMPLANT
PORT ACCESS TROCAR AIRSEAL 12 (TROCAR) ×2 IMPLANT
PORT ACCESS TROCAR AIRSEAL 5M (TROCAR) ×1
POUCH SPECIMEN RETRIEVAL 10MM (ENDOMECHANICALS) IMPLANT
SCISSORS LAP 5X45 EPIX DISP (ENDOMECHANICALS) ×3 IMPLANT
SEAL CANN UNIV 5-8 DVNC XI (MISCELLANEOUS) ×8 IMPLANT
SEAL XI 5MM-8MM UNIVERSAL (MISCELLANEOUS) ×12
SET TRI-LUMEN FLTR TB AIRSEAL (TUBING) ×3 IMPLANT
SOLUTION ELECTROLUBE (MISCELLANEOUS) ×3 IMPLANT
SPONGE T-LAP 4X18 ~~LOC~~+RFID (SPONGE) ×3 IMPLANT
SUT ETHILON 3 0 PS 1 (SUTURE) ×3 IMPLANT
SUT MNCRL AB 4-0 PS2 18 (SUTURE) ×6 IMPLANT
SUT PDS AB 0 CT1 36 (SUTURE) ×6 IMPLANT
SUT V-LOC BARB 180 2/0GR6 GS22 (SUTURE) ×3
SUT VIC AB 3-0 SH 27 (SUTURE) ×12
SUT VIC AB 3-0 SH 27X BRD (SUTURE) ×4 IMPLANT
SUT VIC AB 3-0 SH 27XBRD (SUTURE) ×4 IMPLANT
SUT VICRYL 0 27 CT2 27 ABS (SUTURE) IMPLANT
SUT VICRYL 0 UR6 27IN ABS (SUTURE) ×3 IMPLANT
SUT VLOC BARB 180 ABS3/0GR12 (SUTURE)
SUTURE V-LC BRB 180 2/0GR6GS22 (SUTURE) ×2 IMPLANT
SUTURE VLOC BRB 180 ABS3/0GR12 (SUTURE) IMPLANT
SYR CONTROL 10ML LL (SYRINGE) IMPLANT
TOWEL OR NON WOVEN STRL DISP B (DISPOSABLE) ×3 IMPLANT
TROCAR UNIVERSAL OPT 12M 100M (ENDOMECHANICALS) ×3 IMPLANT
TROCAR XCEL 12X100 BLDLESS (ENDOMECHANICALS) ×3 IMPLANT
TROCAR XCEL NON-BLD 5MMX100MML (ENDOMECHANICALS) IMPLANT
TUBING CONNECTING 10 (TUBING) IMPLANT
WATER STERILE IRR 1000ML POUR (IV SOLUTION) ×3 IMPLANT
WATER STERILE IRR 3000ML UROMA (IV SOLUTION) ×3 IMPLANT

## 2021-08-01 NOTE — Anesthesia Postprocedure Evaluation (Signed)
Anesthesia Post Note  Patient: Sabrina Chapman  Procedure(s) Performed: XI ROBOTIC ASSISTED LAPAROSCOPIC REMOVAL OF BLADDER MESH (Abdomen) CYSTOSCOPY WITH INJECTION OF INDOCYANINE GREEN DYE (Bladder)     Patient location during evaluation: PACU Anesthesia Type: General Level of consciousness: awake and alert Pain management: pain level controlled Vital Signs Assessment: post-procedure vital signs reviewed and stable Respiratory status: spontaneous breathing, nonlabored ventilation, respiratory function stable and patient connected to nasal cannula oxygen Cardiovascular status: blood pressure returned to baseline and stable Postop Assessment: no apparent nausea or vomiting Anesthetic complications: no   No notable events documented.  Last Vitals:  Vitals:   08/01/21 1100 08/01/21 1115  BP: (!) 149/84 (!) 130/94  Pulse: 68 65  Resp: 16 13  Temp:    SpO2: 100% 100%    Last Pain:  Vitals:   08/01/21 1115  TempSrc:   PainSc: 4                  Leathia Farnell S

## 2021-08-01 NOTE — Anesthesia Procedure Notes (Signed)
Procedure Name: Intubation Date/Time: 08/01/2021 8:24 AM Performed by: [place] Jaydis Duchene C, CRNA Pre-anesthesia Checklist: Patient identified, Emergency Drugs available, Suction available and Patient being monitored Patient Re-evaluated:Patient Re-evaluated prior to induction Oxygen Delivery Method: Circle system utilized Preoxygenation: Pre-oxygenation with 100% oxygen Induction Type: IV induction Ventilation: Mask ventilation without difficulty Laryngoscope Size: Mac and 3 Grade View: Grade II Tube type: Oral Tube size: 7.0 mm Number of attempts: 1 Airway Equipment and Method: Stylet and Oral airway Placement Confirmation: ETT inserted through vocal cords under direct vision, positive ETCO2 and breath sounds checked- equal and bilateral Secured at: 20 cm Tube secured with: Tape Dental Injury: Teeth and Oropharynx as per pre-operative assessment

## 2021-08-01 NOTE — Transfer of Care (Signed)
Immediate Anesthesia Transfer of Care Note  Patient: Sabrina Chapman  Procedure(s) Performed: XI ROBOTIC ASSISTED LAPAROSCOPIC REMOVAL OF BLADDER MESH (Abdomen) CYSTOSCOPY WITH INJECTION OF INDOCYANINE GREEN DYE (Bladder)  Patient Location: PACU  Anesthesia Type:General  Level of Consciousness: awake, alert  and oriented  Airway & Oxygen Therapy: Patient Spontanous Breathing and Patient connected to face mask oxygen  Post-op Assessment: Report given to RN and Post -op Vital signs reviewed and stable  Post vital signs: Reviewed and stable  Last Vitals:  Vitals Value Taken Time  BP 146/87 08/01/21 1045  Temp 36.4 C 08/01/21 1030  Pulse 69 08/01/21 1045  Resp 14 08/01/21 1045  SpO2 100 % 08/01/21 1045  Vitals shown include unvalidated device data.  Last Pain:  Vitals:   08/01/21 1030  TempSrc:   PainSc: 8          Complications: No notable events documented.

## 2021-08-01 NOTE — Brief Op Note (Signed)
08/01/2021  10:19 AM  PATIENT:  Sabrina Chapman  50 y.o. female  PRE-OPERATIVE DIAGNOSIS:  MESH IN BLADDER  POST-OPERATIVE DIAGNOSIS:  MESH IN BLADDER  PROCEDURE:  Procedure(s): XI ROBOTIC ASSISTED LAPAROSCOPIC REMOVAL OF BLADDER MESH (N/A) CYSTOSCOPY WITH INJECTION OF INDOCYANINE GREEN DYE (N/A)  SURGEON:  Surgeon(s) and Role:    Sebastian Ache, MD - Primary  PHYSICIAN ASSISTANT:   ASSISTANTS: Flo Shanks PA   ANESTHESIA:   local and general  EBL:  minimal   BLOOD ADMINISTERED:none  DRAINS:  1- JP to bulb; 2 - Foley to gravity    LOCAL MEDICATIONS USED:  MARCAINE     SPECIMEN:  Source of Specimen:  bladder mesh  DISPOSITION OF SPECIMEN:  PATHOLOGY  COUNTS:  YES  TOURNIQUET:  * No tourniquets in log *  DICTATION: .Other Dictation: Dictation Number [id number]  PLAN OF CARE: Admit for overnight observation  PATIENT DISPOSITION:  PACU - hemodynamically stable.   Delay start of Pharmacological VTE agent (>24hrs) due to surgical blood loss or risk of bleeding: yes

## 2021-08-01 NOTE — Discharge Instructions (Addendum)

## 2021-08-01 NOTE — Anesthesia Preprocedure Evaluation (Signed)
Anesthesia Evaluation  Patient identified by MRN, date of birth, ID band Patient awake    Reviewed: Allergy & Precautions, H&P , NPO status , Patient's Chart, lab work & pertinent test results  Airway Mallampati: II  TM Distance: >3 FB Neck ROM: Full    Dental no notable dental hx.    Pulmonary neg pulmonary ROS, former smoker,    Pulmonary exam normal breath sounds clear to auscultation       Cardiovascular negative cardio ROS Normal cardiovascular exam Rhythm:Regular Rate:Normal     Neuro/Psych Depression negative neurological ROS     GI/Hepatic negative GI ROS, Neg liver ROS,   Endo/Other  negative endocrine ROS  Renal/GU negative Renal ROS  negative genitourinary   Musculoskeletal negative musculoskeletal ROS (+)   Abdominal   Peds negative pediatric ROS (+)  Hematology negative hematology ROS (+)   Anesthesia Other Findings   Reproductive/Obstetrics negative OB ROS                             Anesthesia Physical Anesthesia Plan  ASA: 2  Anesthesia Plan: General   Post-op Pain Management:    Induction: Intravenous  PONV Risk Score and Plan: 3 and Ondansetron, Dexamethasone, Midazolam, Treatment may vary due to age or medical condition and Droperidol  Airway Management Planned: Oral ETT  Additional Equipment:   Intra-op Plan:   Post-operative Plan: Extubation in OR  Informed Consent: I have reviewed the patients History and Physical, chart, labs and discussed the procedure including the risks, benefits and alternatives for the proposed anesthesia with the patient or authorized representative who has indicated his/her understanding and acceptance.     Dental advisory given  Plan Discussed with: CRNA and Surgeon  Anesthesia Plan Comments:         Anesthesia Quick Evaluation

## 2021-08-01 NOTE — H&P (Signed)
Sabrina Chapman is an 50 y.o. female.    Chief Complaint: Pre-Op Cystoscopy and Robotic Excision of Mesh  HPI:   1 - Mesh in Bladder / Dysuria - pt with exposed sling mesh in bladder by cysto and CT 2022 on eval dysuria. H/o TAH/BSO/TVT 02/2020 (op notes reviewed, uncomplicated). UCX negative x many.   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   PMH sig for anxiety (SSRI, benzos), cesarean x2. 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 Her PCP is Joselyn Arrow MD.   Today " Sabrina Chapman" is seen to proceed with cysto / ICG and robotic excision of exposed bladder mesh. No interval fevers. Most recent UCX negative.   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    Past Surgical History:  Procedure Laterality Date   BILATERAL SALPINGECTOMY  09/23/2014   BLADDER SUSPENSION N/A 02/23/2020   Procedure: TRANSVAGINAL TAPE (TVT) PROCEDURE;  Surgeon: Carrington Clamp, MD;  Location: [facility];  Service: Gynecology;  Laterality: N/A;   BREAST REDUCTION SURGERY Bilateral 07/24/2018   Procedure: BILATERAL MAMMARY REDUCTION  (BREAST);  Surgeon: Glenna Fellows, MD;  Location: [facility];  Service: Plastics;  Laterality: Bilateral;   BUNIONECTOMY Right 01/28/2017   CESAREAN SECTION  07/15/2012   Procedure: CESAREAN SECTION;  Surgeon: Loney Laurence, MD;  Location: [facility];  Service: Obstetrics;  Laterality: N/A;   CESAREAN SECTION N/A 09/23/2014   Procedure: CESAREAN SECTION;  Surgeon: Loney Laurence, MD;  Location: [facility];  Service: Obstetrics;  Laterality: N/A;   CYSTOCELE REPAIR N/A 02/23/2020   Procedure: ANTERIOR REPAIR (CYSTOCELE);  Surgeon: Carrington Clamp, MD;  Location: [facility];  Service: Gynecology;  Laterality: N/A;   CYSTOSCOPY N/A 02/23/2020   Procedure: CYSTOSCOPY;   Surgeon: Carrington Clamp, MD;  Location: [facility];  Service: Gynecology;  Laterality: N/A;   DILATION AND CURETTAGE OF UTERUS  2008   NASAL SINUS SURGERY     as a child   ROBOTIC ASSISTED TOTAL HYSTERECTOMY WITH BILATERAL SALPINGO OOPHERECTOMY Bilateral 02/23/2020   Procedure: XI ROBOTIC ASSISTED TOTAL HYSTERECTOMY WITH BILATERAL SALPINGO OOPHORECTOMY;  Surgeon: Carrington Clamp, MD;  Location: [facility];  Service: Gynecology;  Laterality: Bilateral;   TUBAL LIGATION      Family History  Problem Relation Age of Onset   Cancer Mother        breast @62 , ovarian @65    Hyperlipidemia Mother    Heart disease Mother 35       by pass surgery   Breast cancer Mother 62   Ovarian cancer Mother 24       metastatic to abd/omentum (diagnosed after finding pleural effusions)   Hyperlipidemia Father    Allergies Father    Depression Father    Cancer Maternal Grandmother 38       breast cancer   Breast cancer Maternal Grandmother    Heart disease Maternal Grandfather    Depression Paternal Grandmother    Colon polyps Neg Hx    Esophageal cancer Neg Hx    Colon cancer Neg Hx    Rectal cancer Neg Hx    Stomach cancer Neg Hx    Social History:  reports that she quit smoking about 23 years ago. Her smoking use included cigarettes. She  has a 1.00 pack-year smoking history. She has never used smokeless tobacco. She reports current alcohol use. She reports that she does not use drugs.  Allergies:  Allergies  Allergen Reactions   Citric Acid Other (See Comments)    GI UPSET    No medications prior to admission.    No results found for this or any previous visit (from the past 48 hour(s)). No results found.  Review of Systems  Constitutional:  Negative for chills and fever.  Genitourinary:  Positive for dysuria.  All other systems reviewed and are negative.  Last menstrual period 01/28/2018. Physical Exam Vitals reviewed.  HENT:     Nose: Nose  normal.  Eyes:     Pupils: Pupils are equal, round, and reactive to light.  Cardiovascular:     Rate and Rhythm: Normal rate.  Abdominal:     General: Abdomen is flat.     Comments: Prior scars w/o hernias.   Genitourinary:    Comments: No CVAT at present Musculoskeletal:        General: Normal range of motion.     Cervical back: Normal range of motion.  Skin:    General: Skin is warm.  Neurological:     General: No focal deficit present.     Mental Status: She is alert.  Psychiatric:        Mood and Affect: Mood normal.     Assessment/Plan Proceed as planned with cysto / ICG (to mark areas of mesh junction) and robotic excision of bladder mesh. Risks, benefits, alternatives, expected peri-op course (incluidng neeed for post-op foley) discussed previously and reiterated today.   Sebastian Ache, MD 08/01/2021, 5:42 AM

## 2021-08-02 ENCOUNTER — Encounter (HOSPITAL_COMMUNITY): Payer: Self-pay | Admitting: Urology

## 2021-08-02 ENCOUNTER — Other Ambulatory Visit: Payer: Self-pay

## 2021-08-02 DIAGNOSIS — T83712A Erosion of implanted urethral mesh to surrounding organ or tissue, initial encounter: Secondary | ICD-10-CM | POA: Diagnosis not present

## 2021-08-02 LAB — HEMOGLOBIN AND HEMATOCRIT, BLOOD
HCT: 34.6 % — ABNORMAL LOW (ref 36.0–46.0)
Hemoglobin: 11.5 g/dL — ABNORMAL LOW (ref 12.0–15.0)

## 2021-08-02 LAB — BASIC METABOLIC PANEL
Anion gap: 4 — ABNORMAL LOW (ref 5–15)
BUN: 12 mg/dL (ref 6–20)
CO2: 25 mmol/L (ref 22–32)
Calcium: 8.2 mg/dL — ABNORMAL LOW (ref 8.9–10.3)
Chloride: 106 mmol/L (ref 98–111)
Creatinine, Ser: 0.83 mg/dL (ref 0.44–1.00)
GFR, Estimated: >60 mL/min (ref >60)
Glucose, Bld: 148 mg/dL — ABNORMAL HIGH (ref 70–99)
Potassium: 3.5 mmol/L (ref 3.5–5.1)
Sodium: 135 mmol/L (ref 135–145)

## 2021-08-02 LAB — SURGICAL PATHOLOGY

## 2021-08-02 MED ORDER — CHLORHEXIDINE GLUCONATE CLOTH 2 % EX PADS
6.0000 | MEDICATED_PAD | Freq: Every day | CUTANEOUS | Status: DC
  Administered 2021-08-02: 6 via TOPICAL

## 2021-08-02 NOTE — Op Note (Signed)
NAME: Sabrina Chapman, Sabrina Chapman MEDICAL RECORD NO: [id number] ACCOUNT NO: [id number] DATE OF BIRTH: Aug 15, 1971 FACILITY: [facility] LOCATION: [id number] PHYSICIAN: Sebastian Ache, MD  Operative Report   PREOPERATIVE DIAGNOSES:  An exposed bladder mesh with irritative voiding symptoms, hematuria.  PROCEDURES:    1.  Cystoscopy with injection of indocyanine green dye. 2.  Robotic-assisted removal of bladder mesh.  ESTIMATED BLOOD LOSS:  Less than 50 mL.  ASSISTANT:  Harrie Foreman, PA  FINDINGS: 1.  Exposed intraluminal bladder mesh right and left side extending from the anterior lateral bladder neck through the anterior dome approximately 5 cm each side exposed. 2.  Complete removal of all intraluminal exposed mesh as well as the superior extravesical component. 3.  Purposeful non-manipulation of sling material around the urethra.  DRAINS:    1.  Jackson-Pratt drain to bulb suction. 2.  Foley catheter to straight drain.  INDICATIONS:  The patient is a very pleasant 50 year old lady who had a relatively uncomplicated hysterectomy and midurethral sling placed in 01/2021.  She subsequently developed progressive irritative voiding symptoms as well as some hematuria and was  found on workup of this to have some exposed mesh material within the lumen of the bladder.  Given the volume of mesh exposure and her young age and excellent functional status, options were discussed for management including recommended path of excision  with robotic approach being preferred.  She has had a good outcome from her sling in terms of continence.  Therefore, we purposely discussed removal of the portion of the mesh within the bladder, but purposely not manipulating any of the mesh in the  area of the urethra, and she wished to proceed.  Informed consent was obtained and placed in the medical record.  DESCRIPTION OF PROCEDURE:  The patient being [name] verified.  Procedure being cystoscopy with injection of  indocyanine green dye and removal of bladder mesh confirmed.  Procedure timeout was performed.  Intravenous antibiotics were  administered.  General endotracheal anesthesia was induced.  The patient was laid into a low lithotomy position.  Sterile field was created by prepping and draping the patient's vagina, introitus, and proximal thighs using iodine, and her infraxiphoid  abdomen using chlorhexidine gluconate.  She was further fastened to the operative table using 3-inch tape over foam padding across the supraxiphoid chest.  Her arms were tucked at her side using gel rolls.  A test of steep Trendelenburg positioning was  performed and was found to be suitably positioned.  Foley catheter was placed per urethra for straight drain.  After verifying with recent axial imaging pressure of fat planes in supraumbilical area, pneumoperitoneum was obtained using a Veress technique  in the supraumbilical midline having passed the aspiration and drop test.  An 8 mm robotic camera port was then placed in the same location.  Laparoscope was introduced in the peritoneal cavity with no sign of adhesions, no visceral injury.  Uterus and  ovaries were surgically absent.  Additional ports were placed as follows:  Right paramedian 8 mm robotic port, right far lateral 12 mm AirSeal assistant port, right paramedian 5 mm suction port, left paramedian 8 mm robotic port, left far lateral 8 mm  robotic port.  The robot was docked and passed the electronic checks.  Next, attention was directed at cystotomy.  A small cystotomy was made approximately 4 cm in length in a right to left fashion at the area of the bladder dome.  After verifying  the area of the superior aspect of  the mesh exposure with some cystotomy exposure of the bladder, there was excellent visualization of both mesh leaflets. First, on the right side, the aspect of the mesh that was inferior towards the  bladder neck was very carefully circumferentially dissected  away from the bladder mucosa and deeper tissue and even muscularis tissue and then purposely transected at the level of the deep detrusor muscle layer to maximally avoid any recurrent or  persistent mesh exposure. A mirror image dissection was performed on the left side.  Notably, these areas were well away from the ureteral orifices, which remained visibly patent of clear urine.  Each mesh leaflet was then placed on inferior traction and  similarly transected at the level of detrusor muscle layer after circumferential dissection at the superior aspect of the bladder, and each mesh leaflet was set aside for permanent pathology.  The inferior area of prior mesh transection was then closed  using 2 separate running suture layers of 3-0 Vicryl, first of the muscularis and then of the mucosa via transvesical technique, resulted in excellent apposition of the bladder musculature and mucosa over the area of prior mesh exposure. The superior  aspect of each mesh exposure site was further inspected as the goal today was maximally remove all mesh not serving continence purposes.  The space of Retzius was very carefully developed on the right side just enough to allow inspection of the  extravesical component of the superior exposed mesh.  This was circumferentially mobilized, and circumferential small cystotomy was made, thus, completely removing residual superior aspect of the mesh on the right side, setting aside for permanent  pathology, and a mirror image dissection was performed on the left side.  The small superior cystotomies were then closed intravesically at the level of the mucosa using running 3-0 Vicryl and extravesically at the level of the detrusor muscle  using 3-0 Vicryl.  I was quite happy with the integrity of these.  Next, the access cystotomy was closed using running 2-0 V-Loc. Foley catheter was flushed and irrigated quantitatively.  There was no obvious leak.  I was quite happy with the   completeness of the mesh removal and the safety of the procedure today. We achieved the goals of the procedure today.  Robot was then undocked.  The previous right lateral most 12 mm assistant port site was closed at the level of the fascia using  Carter-Thomason suture passer with 0 Vicryl.  Pneumoperitoneum was relieved. All ports were removed.  All incision sites were infiltrated with dilute lipolyzed Marcaine and closed at the level of the skin using subcuticular Monocryl followed by  Dermabond.  The procedure was then terminated.  The patient tolerated procedure well.  No immediate perioperative complications.  The patient was taken to postanesthesia care unit in stable condition.  Plan for discharge home with catheter after observation stay.   [facility] D: 08/01/2021 10:29:37 am T: 08/01/2021 12:43:00 pm  JOB: [id number]

## 2021-08-02 NOTE — Progress Notes (Signed)
Pt ambulated about 50 ft in hall. Tolerated fair. Could not walk more due to pain.

## 2021-08-02 NOTE — Discharge Summary (Signed)
Physician Discharge Summary  Patient ID: Sabrina Chapman MRN: [id number] DOB/AGE: [birth year] 50 y.o.  Admit date: 08/01/2021 Discharge date: 08/02/2021  Admission Diagnoses: Exposed Bladder Mesh  Discharge Diagnoses:  Principal Problem:   Eroded bladder suspension mesh [facility])   Discharged Condition: good  Hospital Course: Pt underwent robotic excision of exposed bladder mesh on 08/01/2021, the day of admission, without acute complication. She was observed on the 4th floor Urology service post-op where she made her vigorous recovery. By the afternoon of POD 1, the, day of discharge, she is ambulatory, pain controlled on PO meds, tolerating PO intake and felt to be adequate for discharge. JP removed prior to discharge as output scant. Hgb 11.5, Cr 0.8. She will go home with foley, has Fu arranged for cystogram in few weeks.   Consults: None  Significant Diagnostic Studies: labs: as per above  Treatments: surgery: as per above  Discharge Exam: Blood pressure 110/69, pulse 65, temperature 98.8 F (37.1 C), temperature source Oral, resp. rate 20, height 5\' 7"  (1.702 m), weight 66.9 kg, last menstrual period 01/28/2018, SpO2 99 %. General appearance: alert and cooperative Eyes: negative Nose: Nares normal. Septum midline. Mucosa normal. No drainage or sinus tenderness. Throat: lips, mucosa, and tongue normal; teeth and gums normal Neck: supple, symmetrical, trachea midline Back: symmetric, no curvature. ROM normal. No CVA tenderness. Resp: clear to auscultation bilaterally Cardio: regular rate and rhythm, S1, S2 normal, no murmur, click, rub or gallop GI: soft, non-tender; bowel sounds normal; no masses,  no organomegaly Extremities: extremities normal, atraumatic, no cyanosis or edema Pulses: 2+ and symmetric Skin: Skin color, texture, turgor normal. No rashes or lesions Neurologic: Grossly normal  Disposition:    Allergies as of 08/02/2021       Reactions   Citric Acid  Other (See Comments)   GI UPSET        Medication List     STOP taking these medications    cholecalciferol 25 MCG (1000 UNIT) tablet Commonly known as: VITAMIN D3   L-Theanine 200 MG Caps       TAKE these medications    acetaminophen 500 MG tablet Commonly known as: TYLENOL Take 1,000 mg by mouth every 6 (six) hours as needed for moderate pain.   AZO BLADDER CONTROL/GO-LESS PO Take 2 tablets by mouth daily.   docusate sodium 100 MG capsule Commonly known as: COLACE Take 1 capsule (100 mg total) by mouth 2 (two) times daily.   estradiol 0.5 MG tablet Commonly known as: ESTRACE Take 0.5 mg by mouth daily.   FLUoxetine 10 MG capsule Commonly known as: PROZAC TAKE 1 CAPSULE(10 MG) BY MOUTH DAILY   HYDROcodone-acetaminophen 5-325 MG tablet Commonly known as: Norco Take 1-2 tablets by mouth every 6 (six) hours as needed for moderate pain or severe pain.   loratadine 10 MG tablet Commonly known as: CLARITIN Take 10 mg by mouth daily.   NASACORT ALLERGY 24HR NA Place 2 sprays into the nose daily.   sulfamethoxazole-trimethoprim 800-160 MG tablet Commonly known as: BACTRIM DS Take 1 tablet by mouth 2 (two) times daily. Start the day prior to foley removal appointment        Follow-up Information     14/09/2020, MD Follow up on 08/14/2021.   Specialty: Urology Why: at 9:30 for X-Ray, MD visit, and catheter removal Contact information: [address] [place] [phone]                 Signed: [phone]  08/02/2021, 1:26 PM

## 2021-08-02 NOTE — Progress Notes (Signed)
Pt ready for DC. IV removed. JP drain removed by MD. Provided DC paperwork reviewed follow up, dc instructions, and mediations, instructed on when to take each medication. Reviewed incision care and foley care. Foley bag changed into leg bag. Pt given dressing supplies and foley bags for home use. Answered all questions. Pt collected all belongings. DC via wheelchair.

## 2021-08-02 NOTE — Plan of Care (Signed)

## 2021-11-30 IMAGING — MR MR BREAST BILAT WO/W CM
8 of 12 series · 33 of 48 positions shown · IV contrast (gadavist)
Comparison: Previous exam(s).  04/12/2019 and prior breast MRs

CLINICAL DATA: 48-year-old female for short-term follow-up of
benign RIGHT breast biopsies.

LABS:  None performed today
EXAM:
BILATERAL BREAST MRI WITH AND WITHOUT CONTRAST
TECHNIQUE: Multiplanar, multisequence MR images of both breasts were obtained
prior to and following the intravenous administration of 6 ml of
Gadavist

[Series 2: t2_tirm_tra ipat (a-p) · axial · 3.0mm · 0.70mm/px · 1 of 55 slices shown]
[im 1/55]
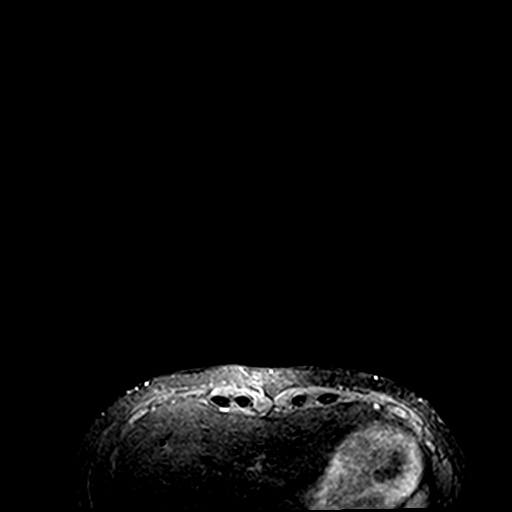

[Series 3: fl3d pre-cm no · axial · non-contrast · 1.2mm · 0.94mm/px · z∈[-86,+86]mm · 5 of 144 slices shown]
[im 1/144]
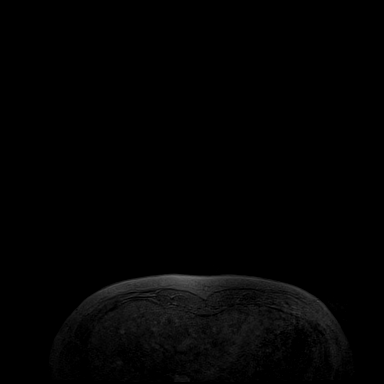
[im 36/144]
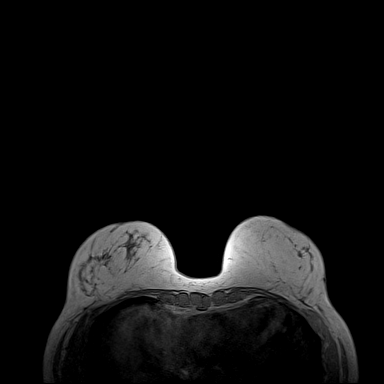
[im 72/144]
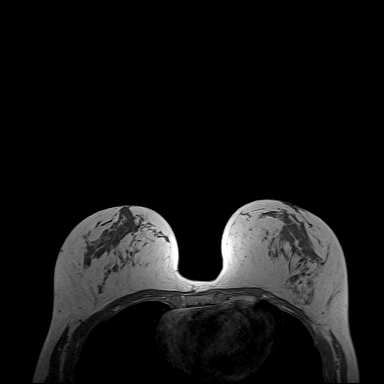
[im 108/144]
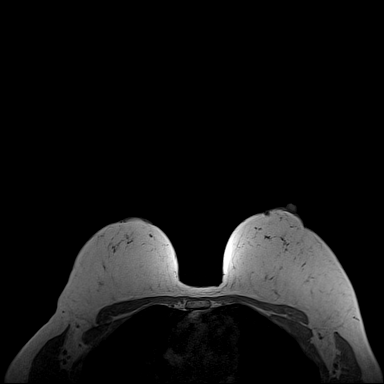
[im 144/144]
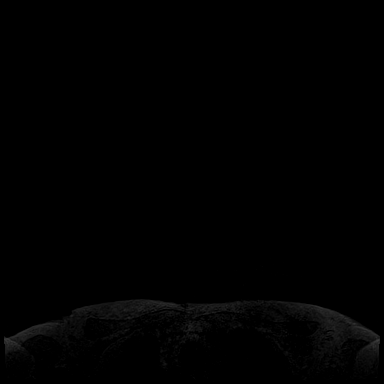

[Series 4: fl3d pre-cm · axial · non-contrast · 1.2mm · 0.94mm/px · z∈[-86,+86]mm · 5 of 144 slices shown]
[im 1/144]
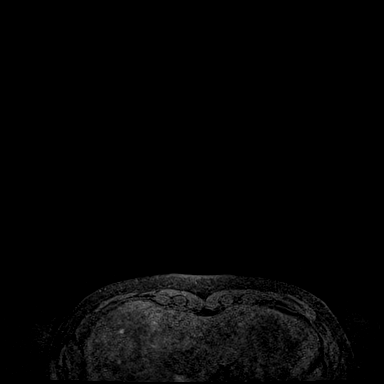
[im 36/144]
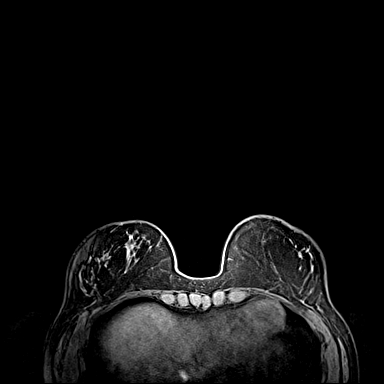
[im 72/144]
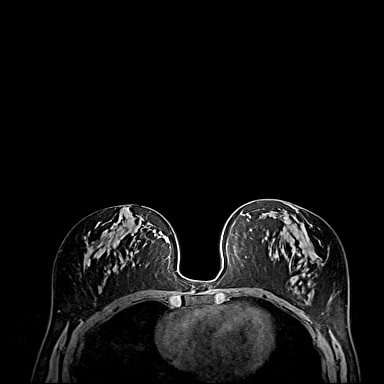
[im 108/144]
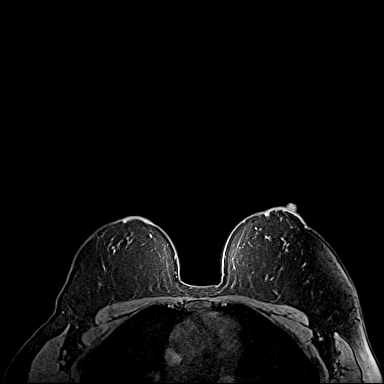
[im 144/144]
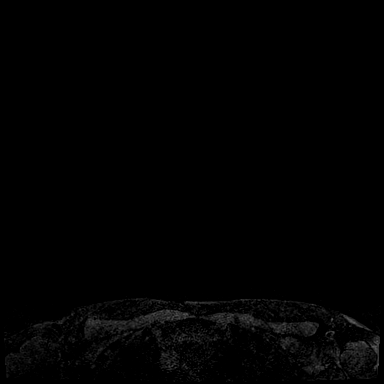

[Series 5: fl3d post immediate · axial · 1.2mm · 0.94mm/px · z∈[-86,+86]mm · 5 of 144 slices shown (1 of 3)]
[im 1/144]
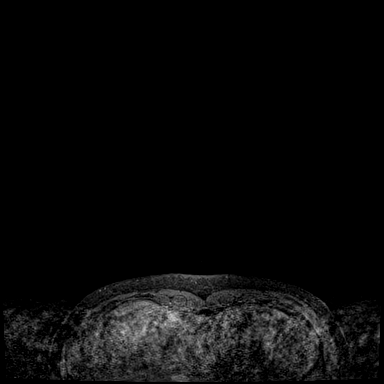
[im 36/144]
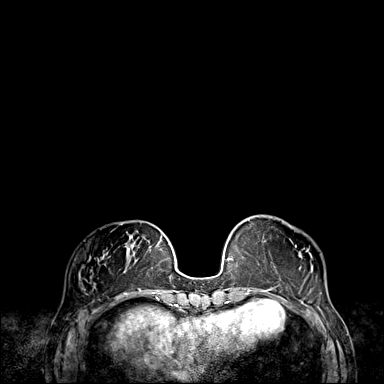
[im 72/144]
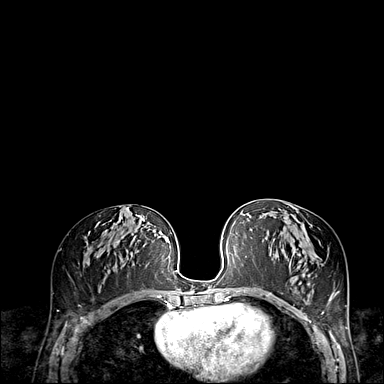
[im 108/144]
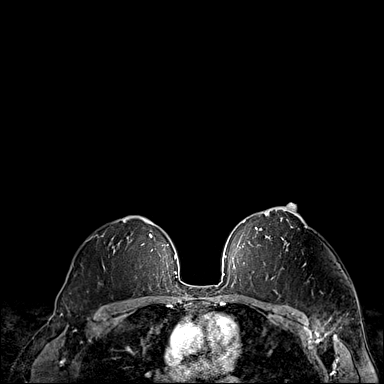
[im 144/144]
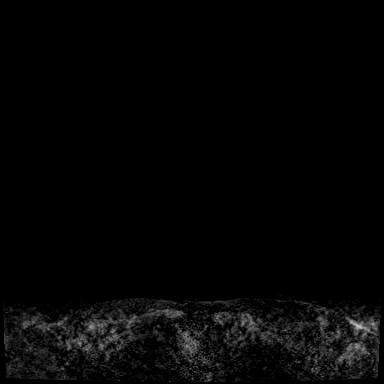

[Series 6: fl3d post immediate · axial · 1.2mm · 0.94mm/px · z∈[-86,+86]mm · 5 of 144 slices shown (2 of 3)]
[im 1/144]
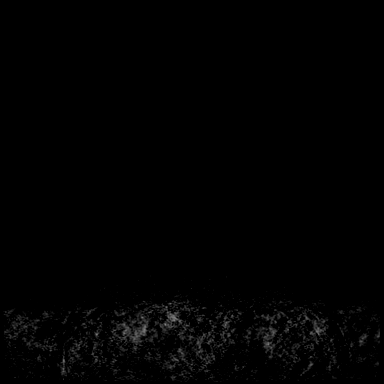
[im 36/144]
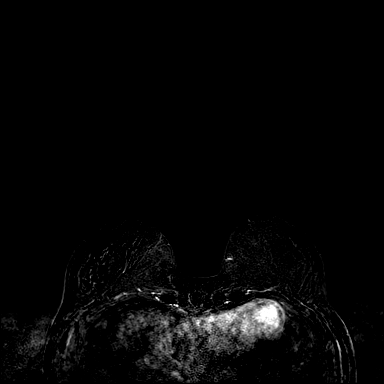
[im 72/144]
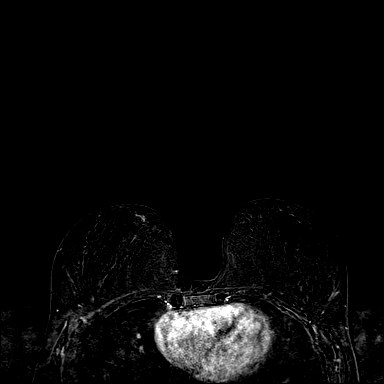
[im 108/144]
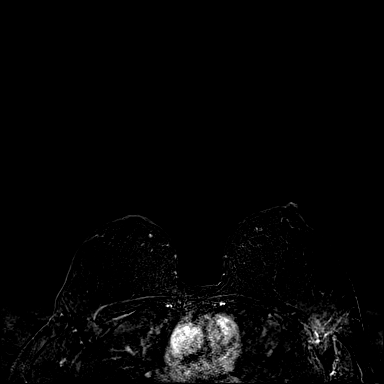
[im 144/144]
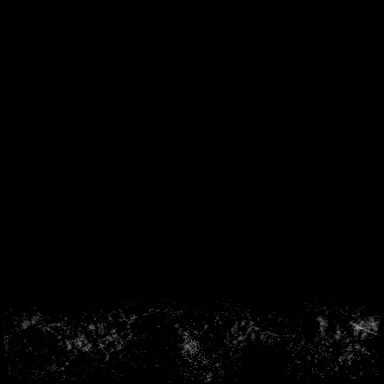

[Series 7: fl3d post immediate · axial · 172.8mm · 0.94mm/px · 1 of 1 slices shown (3 of 3)]
[im 1/1]
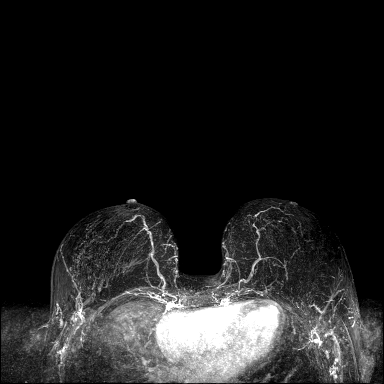

[Series 8: fl3d post 3min · axial · 1.2mm · 0.94mm/px · z∈[-86,+86]mm · 6 of 144 slices shown]
[im 1/144]
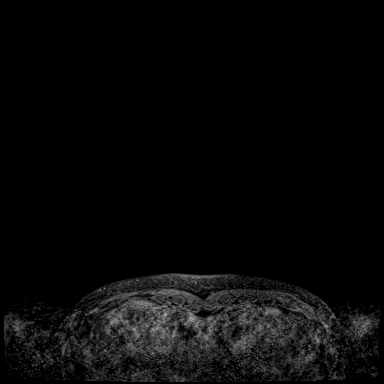
[im 29/144]
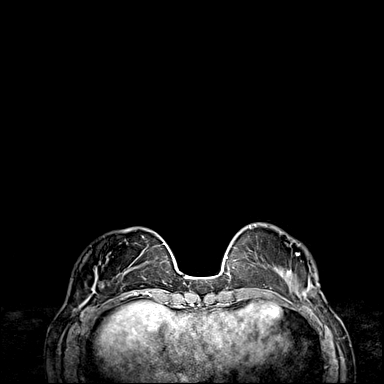
[im 58/144]
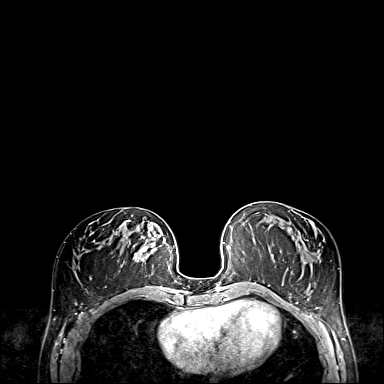
[im 86/144]
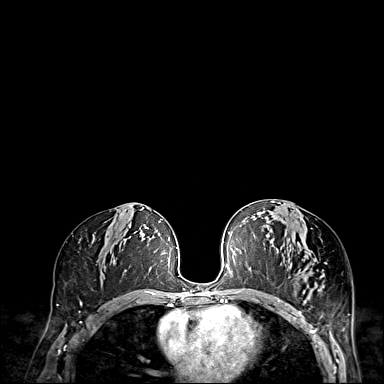
[im 115/144]
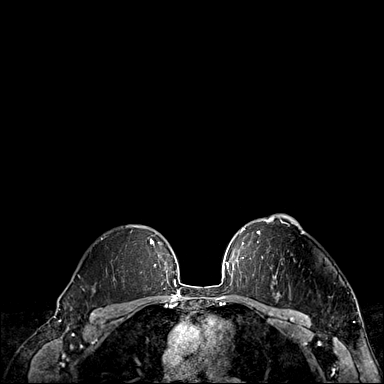
[im 144/144]
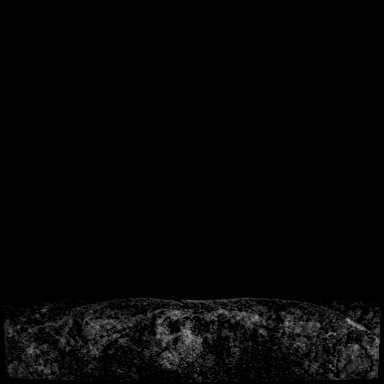

[Series 9: fl3d post 3min_sub · axial · 1.2mm · 0.94mm/px · z∈[-86,+51]mm · 5 of 144 slices shown]
[im 1/144]
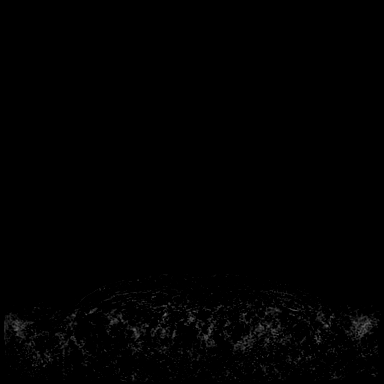
[im 29/144]
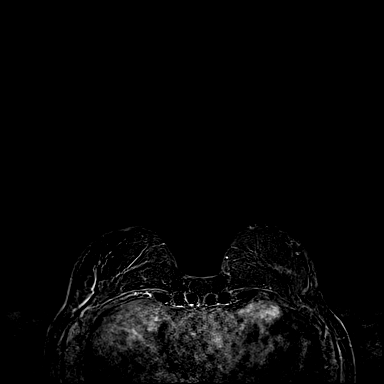
[im 58/144]
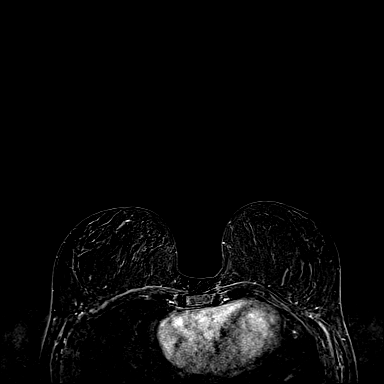
[im 86/144]
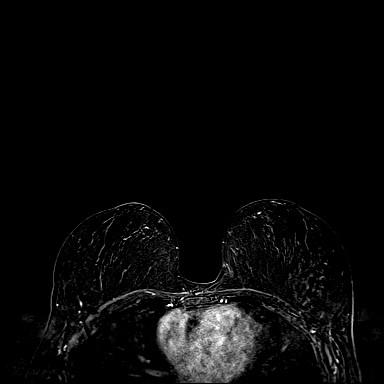
[im 115/144]
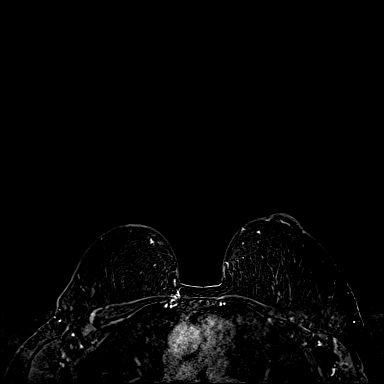

[33 of 48 positions shown; findings below may reference images not displayed]

Three-dimensional MR images were rendered by post-processing of the
original MR data on an independent workstation. The
three-dimensional MR images were interpreted, and findings are
reported in the following complete MRI report for this study. Three
dimensional images were evaluated at the independent [employer]
workstation
FINDINGS: Breast composition: c. Heterogeneous fibroglandular tissue.

Background parenchymal enhancement: Mild

Right breast: No mass or abnormal enhancement. Biopsy clip artifact
within the RIGHT breast again noted. No abnormal enhancement
persists at the areas of previous biopsy.

Left breast: No mass or abnormal enhancement.

Lymph nodes: No abnormal appearing lymph nodes.

Ancillary findings:  None.
IMPRESSION: No MR evidence of breast malignancy.

No residual enhancement identified at the sites of previous RIGHT
breast biopsies.

RECOMMENDATION:
Bilateral screening mammogram in 1 year to resume annual mammogram
schedule.

Consider annual screening breast MRI if the patient is at high risk
for developing breast cancer (greater than 20%)

BI-RADS CATEGORY  1: Negative.

## 2022-02-19 NOTE — Progress Notes (Signed)
Chief Complaint  Patient presents with   Annual Exam    Annual exam(patient did not fast-would prefer to come back for labs) no pap-sees Dr. Henderson Cloud, did not give UA-said she does at GYN. Does not have any concerns.     Sabrina Chapman is a 51 y.o. female who presents for a complete physical.    Eczema on her hands--has a constant dry spot on the L hand, not itchy.  Uses cortisone cream when it gets worse, itchy. She also has a spot on her L ankle. It improves when she uses medicine regularly, hasn't been.  Allergies:  She continues to have some itchy eyes.  She was sent to a specialist for some sort of heat treatment for dry eyes and puffiness.  It is a little better. She continues to use Alaway (Ketotifen) prn.  She continues to use claritin year-long.  She uses the [employer] and prn.  She has recurrence of symptoms when she cuts back on these medications.  She had robotic laparoscopic total hysterectomy with BSO, TVT by Dr. Henderson Cloud in 2021 (had stress incontinence, mild cystocele, and family history of ovarian cancer). She had ongoing urinary urgency, requiring regular use of Azo a year later.  She was referred to urology last year, was found to have exposed sling mesh in the bladder on cysto and CT, and she underwent robotic mesh removal in 07/2021.  All urinary symptoms resolved.   Anxiety/adjustment disorder--She was treated with Prozac for stressors related to her husband's stroke, and his inability to care for their children.  She has been doing well on prozac 10mg  (felt "flat" emotionally at higher dose).    Her husband improved, is back to work (though she reports he can't really multitask well, so works and cooks dinner, but leaves her with a lot of other responsibilities).  Her daughter, , is on the autism spectrum, with behavioral issues.  She had a great year at [place], and weaned off of weekly therapy, doing well.  Son has learning disabilities, and will be  attending [place] next year.  She switched jobs last year, it wasn't a good fit for her.  She left that job in 03/2021. 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 She is doing only what she wants to do, enjoys her job.  She is interested in getting counseling, needs to grieve about husband's stroke, not being the same person she married.  Works with trainer 2x/week and started seeing a Civil engineer, contracting in February.  She increased protein intake, increased calories, is building muscles.  Feels great, sleeping well. Was told she wasn't eating enough calories when she started. She has gained weight; plans to make some cuts in calories for some loss. Clothes went up a size.  Shoulders are broader. Has some to lose at the waist. Maintaining current weight at 1800 cal/d (had been gaining when on 2000/d)  Vitamin D deficiency:  Level was low at 24 in the past.  Last level was 47.5 in 01/2021, when taking 1000 IU daily.  She continues to take 1000 IU daily.   Immunization History  Administered Date(s) Administered   Influenza Split 07/17/2012   Influenza,inj,Quad PF,6+ Mos 09/15/2013, 07/05/2014, 05/02/2016, 08/19/2018   Influenza-Unspecified 10/14/2017, 06/02/2018,  06/17/2019, 06/20/2020, 07/17/2021   Moderna Covid-19 Vaccine Bivalent Booster 19yrs & up 07/17/2021   PFIZER(Purple Top)SARS-COV-2 Vaccination 11/06/2019, 11/27/2019, 06/20/2020   PPD Test 11/25/2012   Tdap 07/16/2012, 08/12/2014   Last Pap smear: 09/2017 with Dr. Philis Pique, gets yearly exams. S/p hysterectomy 02/2020 for benign reasons Last mammogram: 03/2021 Last colonoscopy: 07/2020 Dr. Ardis Hughs, normal.  Recheck in 10 years Last DEXA: never Dentist:  Twice yearly Ophtho: yearly Exercise:   Works with a Clinical research associate 2x/week and gym 3x/week, heavy lifting.  Warms up with cardio (5 mins). Walks  2x/week (45 minutes) in the neighborhood.  Lipid screen: Lab Results  Component Value Date   CHOL 226 (H) 02/15/2020   HDL 92 02/15/2020   LDLCALC 123 (H) 02/15/2020   TRIG 66 02/15/2020   CHOLHDL 2.5 02/15/2020     PMH, PSH, SH and FH reviewed and updated  Outpatient Encounter Medications as of 02/20/2022  Medication Sig Note   cholecalciferol (VITAMIN D3) 25 MCG (1000 UNIT) tablet Take 1,000 Units by mouth daily.    Cyanocobalamin (VITAMIN B-12) 1000 MCG SUBL Place 1 each under the tongue daily. 02/20/2022: 1 dropper full   estradiol (ESTRACE) 0.5 MG tablet Take 0.5 mg by mouth daily.    FLUoxetine (PROZAC) 10 MG capsule TAKE 1 CAPSULE(10 MG) BY MOUTH DAILY    fluticasone (FLONASE) 50 MCG/ACT nasal spray Place 2 sprays into both nostrils daily.    loratadine (CLARITIN) 10 MG tablet Take 10 mg by mouth daily.    [DISCONTINUED] acetaminophen (TYLENOL) 500 MG tablet Take 1,000 mg by mouth every 6 (six) hours as needed for moderate pain.    [DISCONTINUED] docusate sodium (COLACE) 100 MG capsule Take 1 capsule (100 mg total) by mouth 2 (two) times daily.    [DISCONTINUED] HYDROcodone-acetaminophen (NORCO) 5-325 MG tablet Take 1-2 tablets by mouth every 6 (six) hours as needed for moderate pain or severe pain.    [DISCONTINUED] Pumpkin Seed-Soy Germ (AZO BLADDER CONTROL/GO-LESS PO) Take 2 tablets by mouth daily.    [DISCONTINUED] sulfamethoxazole-trimethoprim (BACTRIM DS) 800-160 MG tablet Take 1 tablet by mouth 2 (two) times daily. Start the day prior to foley removal appointment    [DISCONTINUED] Triamcinolone Acetonide (NASACORT ALLERGY 24HR NA) Place 2 sprays into the nose daily.    No facility-administered encounter medications on file as of 02/20/2022.   Allergies  Allergen Reactions   Citric Acid Other (See Comments)    GI UPSET    ROS: The patient denies anorexia, fever,  headaches,  vision changes, decreased hearing, ear pain, sore throat, breast concerns, chest pain, palpitations, syncope, dyspnea on exertion, cough, swelling, nausea, vomiting, diarrhea, constipation, abdominal pain, melena, hematochezia, indigestion/heartburn, hematuria, dysuria, vaginal discharge, odor or itch, genital lesions, joint pains, numbness, tingling, weakness, tremor, suspicious skin lesions, abnormal bleeding/bruising, or enlarged lymph nodes.  Mild eczema on hand and L ankle Urinary symptoms resolved. Allergies per HPI Weight gain per HPI; lifting heavy, gaining muscle mass.   PHYSICAL EXAM:  BP 118/68   Pulse 60   Ht 5\' 6"  (1.676 m)   Wt 155 lb 3.2 oz (70.4 kg)   LMP 01/28/2018   BMI 25.05 kg/m   Wt Readings from Last 3 Encounters:  02/20/22 155 lb 3.2 oz (70.4 kg)  08/02/21 147 lb 8 oz (66.9 kg)  07/25/21 145 lb (65.8 kg)    General Appearance:    Alert, cooperative, no distress, appears stated age  Head:    Normocephalic, without obvious abnormality, atraumatic  Eyes:    PERRL,  conjunctiva/corneas clear, EOM's intact, fundi benign  Ears:    Normal TM's and external ear canals  Nose:   Normal; mod mucosal edema with clear mucus noted on the right.  No sinus tenderness  Throat:   Normal, no lesions  Neck:   Supple, no lymphadenopathy;  thyroid:  no enlargement/ tenderness/nodules; no carotid bruit or JVD  Back:    Spine nontender, no curvature, ROM normal, no CVA  tenderness  Lungs:     Clear to auscultation bilaterally without wheezes, rales or  ronchi; respirations unlabored  Chest Wall:    No tenderness or deformity   Heart:    Regular rate and rhythm, S1 and S2 normal, no murmur, rub or gallop  Breast Exam:    Deferred to GYN  Abdomen:     Soft, non-tender, nondistended, normoactive bowel sounds, no masses, no hepatosplenomegaly  Genitalia:    Deferred to GYN       Extremities:   No clubbing, cyanosis or edema.   Pulses:   2+ and symmetric all extremities   Skin:   Skin color, texture, turgor normal. Dry patch L hand and L ankle  Lymph nodes:   Cervical, supraclavicular, and inguinal nodes normal  Neurologic:   Normal strength, sensation and gait; reflexes 2+ and symmetric throughout                                Psych:   Normal mood, affect, hygiene and grooming     02/20/2022    2:43 PM 02/19/2021    2:05 PM 02/19/2021    1:54 PM 02/17/2020    2:19 PM 02/17/2020    1:43 PM  Depression screen PHQ 2/9  Decreased Interest 0 1 1 2  0  Down, Depressed, Hopeless 0 2 2 1  0  PHQ - 2 Score 0 3 3 3  0  Altered sleeping 0 2  0   Tired, decreased energy 1 2  1    Change in appetite 0 0  1   Feeling bad or failure about yourself  0 1  2   Trouble concentrating 0 0  0   Moving slowly or fidgety/restless 0 0  0   Suicidal thoughts 0 0  0   PHQ-9 Score 1 8  7    Difficult doing work/chores Not difficult at all Somewhat difficult         02/20/2022    2:44 PM 02/17/2020    2:22 PM  GAD 7 : Generalized Anxiety Score  Nervous, Anxious, on Edge 1 0  Control/stop worrying 0 0  Worry too much - different things 0 0  Trouble relaxing 0 0  Restless 0 0  Easily annoyed or irritable 1 1  Afraid - awful might happen 0 0  Total GAD 7 Score 2 1  Anxiety Difficulty Not difficult at all     ASSESSMENT/PLAN:  Annual physical exam - Plan: Lipid panel, Comprehensive metabolic panel, CBC with Differential/Platelet  Depression, major, in remission ([place]) - and h/o anxiety. Controlled. Will look into counseling - Plan: FLUoxetine (PROZAC) 10 MG capsule  Vitamin D deficiency - adequately replaced, cont current supplements  Hand eczema - also has a spot on lower leg. Discussed moisturizer, steroid cream prn (pt has rx)  Diet has changed, much more protein, more red meat, cheese. Requesting recheck of lipids today with labs.   Discussed monthly self breast exams and yearly mammograms; at least 30 minutes of  aerobic activity at least 5 days/week, weight-bearing  exercise 2-3x/wk; proper sunscreen use reviewed; healthy diet, including goals of calcium and vitamin D intake and alcohol recommendations (less than or equal to 1 drink/day) reviewed; regular seatbelt use; changing batteries in smoke detectors.  Immunization recommendations discussed--continue yearly flu shots.  Shingrix recommended, discussed risks/SE. To check insurance and schedule [place] when desired. Updated bivalent COVID booster is recommended when available (Fall). Colonoscopy recommendations reviewed, UTD.  F/u 1 year, sooner prn.

## 2022-02-19 NOTE — Patient Instructions (Signed)
  HEALTH MAINTENANCE RECOMMENDATIONS:  It is recommended that you get at least 30 minutes of aerobic exercise at least 5 days/week (for weight loss, you may need as much as 60-90 minutes). This can be any activity that gets your heart rate up. This can be divided in 10-15 minute intervals if needed, but try and build up your endurance at least once a week.  Weight bearing exercise is also recommended twice weekly.  Eat a healthy diet with lots of vegetables, fruits and fiber.  "Colorful" foods have a lot of vitamins (ie green vegetables, tomatoes, red peppers, etc).  Limit sweet tea, regular sodas and alcoholic beverages, all of which has a lot of calories and sugar.  Up to 1 alcoholic drink daily may be beneficial for women (unless trying to lose weight, watch sugars).  Drink a lot of water.  Calcium recommendations are 1200-1500 mg daily (1500 mg for postmenopausal women or women without ovaries), and vitamin D 1000 IU daily.  This should be obtained from diet and/or supplements (vitamins), and calcium should not be taken all at once, but in divided doses.  Monthly self breast exams and yearly mammograms for women over the age of 43 is recommended.  Sunscreen of at least SPF 30 should be used on all sun-exposed parts of the skin when outside between the hours of 10 am and 4 pm (not just when at beach or pool, but even with exercise, golf, tennis, and yard work!)  Use a sunscreen that says "broad spectrum" so it covers both UVA and UVB rays, and make sure to reapply every 1-2 hours.  Remember to change the batteries in your smoke detectors when changing your clock times in the spring and fall. Carbon monoxide detectors are recommended for your home.  Use your seat belt every time you are in a car, and please drive safely and not be distracted with cell phones and texting while driving.  I recommend getting the updated bivalent COVID booster when it becomes available in the Fall.  I recommend  getting the new shingles vaccine (Shingrix). You may want to check with your insurance to verify what your out of pocket cost may be (usually covered as preventative, but better to verify to avoid any surprises, as this vaccine is expensive), and then schedule a nurse visit at our office when convenient (based on the possible side effects as discussed).   This is a series of 2 injections, spaced 2 months apart.  It doesn't have to be exactly 2 months apart (but can't be sooner), if that isn't feasible for your schedule, but try and get them close to 2 months (and definitely within 6 months of each other, or else the efficacy of the vaccine drops off). This should be separated from other vaccines by at least 2 weeks.

## 2022-02-20 ENCOUNTER — Ambulatory Visit (INDEPENDENT_AMBULATORY_CARE_PROVIDER_SITE_OTHER): Payer: Managed Care, Other (non HMO) | Admitting: Family Medicine

## 2022-02-20 ENCOUNTER — Encounter: Payer: Self-pay | Admitting: Family Medicine

## 2022-02-20 VITALS — BP 118/68 | HR 60 | Ht 66.0 in | Wt 155.2 lb

## 2022-02-20 DIAGNOSIS — F325 Major depressive disorder, single episode, in full remission: Secondary | ICD-10-CM

## 2022-02-20 DIAGNOSIS — Z0000 Encounter for general adult medical examination without abnormal findings: Secondary | ICD-10-CM

## 2022-02-20 DIAGNOSIS — F324 Major depressive disorder, single episode, in partial remission: Secondary | ICD-10-CM

## 2022-02-20 DIAGNOSIS — E559 Vitamin D deficiency, unspecified: Secondary | ICD-10-CM | POA: Diagnosis not present

## 2022-02-20 DIAGNOSIS — L309 Dermatitis, unspecified: Secondary | ICD-10-CM | POA: Diagnosis not present

## 2022-02-20 MED ORDER — FLUOXETINE HCL 10 MG PO CAPS: ORAL_CAPSULE | ORAL | 3 refills | Status: DC

## 2022-02-21 ENCOUNTER — Encounter: Payer: Self-pay | Admitting: Family Medicine

## 2022-02-22 ENCOUNTER — Other Ambulatory Visit (INDEPENDENT_AMBULATORY_CARE_PROVIDER_SITE_OTHER): Payer: Managed Care, Other (non HMO)

## 2022-02-22 DIAGNOSIS — Z23 Encounter for immunization: Secondary | ICD-10-CM | POA: Diagnosis not present

## 2022-02-22 DIAGNOSIS — Z0000 Encounter for general adult medical examination without abnormal findings: Secondary | ICD-10-CM

## 2022-02-23 ENCOUNTER — Encounter: Payer: Self-pay | Admitting: Family Medicine

## 2022-02-23 LAB — LIPID PANEL
Chol/HDL Ratio: 3.5 ratio (ref 0.0–4.4)
Cholesterol, Total: 245 mg/dL — ABNORMAL HIGH (ref 100–199)
HDL: 71 mg/dL (ref >39)
LDL Chol Calc (NIH): 163 mg/dL — ABNORMAL HIGH (ref 0–99)
Triglycerides: 67 mg/dL (ref 0–149)
VLDL Cholesterol Cal: 11 mg/dL (ref 5–40)

## 2022-02-23 LAB — CBC WITH DIFFERENTIAL/PLATELET
Basophils Absolute: 0 10*3/uL (ref 0.0–0.2)
Basos: 0 %
EOS (ABSOLUTE): 0.1 10*3/uL (ref 0.0–0.4)
Eos: 3 %
Hematocrit: 39.3 % (ref 34.0–46.6)
Hemoglobin: 13.6 g/dL (ref 11.1–15.9)
Immature Grans (Abs): 0 10*3/uL (ref 0.0–0.1)
Immature Granulocytes: 0 %
Lymphocytes Absolute: 1 10*3/uL (ref 0.7–3.1)
Lymphs: 25 %
MCH: 32.1 pg (ref 26.6–33.0)
MCHC: 34.6 g/dL (ref 31.5–35.7)
MCV: 93 fL (ref 79–97)
Monocytes Absolute: 0.4 10*3/uL (ref 0.1–0.9)
Monocytes: 10 %
Neutrophils Absolute: 2.3 10*3/uL (ref 1.4–7.0)
Neutrophils: 62 %
Platelets: 279 10*3/uL (ref 150–450)
RBC: 4.24 x10E6/uL (ref 3.77–5.28)
RDW: 12.6 % (ref 11.7–15.4)
WBC: 3.8 10*3/uL (ref 3.4–10.8)

## 2022-02-23 LAB — COMPREHENSIVE METABOLIC PANEL
ALT: 22 IU/L (ref 0–32)
AST: 25 IU/L (ref 0–40)
Albumin/Globulin Ratio: 1.5 (ref 1.2–2.2)
Albumin: 4.4 g/dL (ref 3.8–4.8)
Alkaline Phosphatase: 82 IU/L (ref 44–121)
BUN/Creatinine Ratio: 24 — ABNORMAL HIGH (ref 9–23)
BUN: 17 mg/dL (ref 6–24)
Bilirubin Total: 0.8 mg/dL (ref 0.0–1.2)
CO2: 24 mmol/L (ref 20–29)
Calcium: 8.9 mg/dL (ref 8.7–10.2)
Chloride: 104 mmol/L (ref 96–106)
Creatinine, Ser: 0.72 mg/dL (ref 0.57–1.00)
Globulin, Total: 2.9 g/dL (ref 1.5–4.5)
Glucose: 99 mg/dL (ref 70–99)
Potassium: 4.7 mmol/L (ref 3.5–5.2)
Sodium: 142 mmol/L (ref 134–144)
Total Protein: 7.3 g/dL (ref 6.0–8.5)
eGFR: 102 mL/min/{1.73_m2} (ref >59)

## 2022-02-24 ENCOUNTER — Other Ambulatory Visit: Payer: Self-pay | Admitting: Family Medicine

## 2022-02-24 DIAGNOSIS — F325 Major depressive disorder, single episode, in full remission: Secondary | ICD-10-CM

## 2022-02-25 ENCOUNTER — Encounter: Payer: Self-pay | Admitting: *Deleted

## 2022-03-08 LAB — HM MAMMOGRAPHY

## 2022-03-13 ENCOUNTER — Encounter: Payer: Self-pay | Admitting: Family Medicine

## 2022-04-24 ENCOUNTER — Encounter: Payer: Self-pay | Admitting: Family Medicine

## 2022-04-24 ENCOUNTER — Ambulatory Visit (INDEPENDENT_AMBULATORY_CARE_PROVIDER_SITE_OTHER): Payer: Managed Care, Other (non HMO) | Admitting: Family Medicine

## 2022-04-24 ENCOUNTER — Ambulatory Visit
Admission: RE | Admit: 2022-04-24 | Discharge: 2022-04-24 | Disposition: A | Discharge status: Home / Self Care | Payer: Managed Care, Other (non HMO) | Source: Ambulatory Visit | Attending: Family Medicine | Admitting: Family Medicine

## 2022-04-24 VITALS — BP 120/70 | HR 68 | Wt 153.0 lb

## 2022-04-24 DIAGNOSIS — M79645 Pain in left finger(s): Secondary | ICD-10-CM

## 2022-04-24 DIAGNOSIS — E7800 Pure hypercholesterolemia, unspecified: Secondary | ICD-10-CM | POA: Diagnosis not present

## 2022-04-24 NOTE — Progress Notes (Signed)
Chief Complaint  Patient presents with   Acute Visit    Knot on left middle finger that's been there for about 6 months. Her uncle had something similar and it turned out to be cancer. Has questions about last labs   About 6 months ago she noted a lump on the left 3rd knuckle (PIP).  It has gotten slightly larger.   Hurts to flex the knuckle and put pressure on the lump (not just gently touch). Denies joint swelling or erythema.  Uncle had cancer at the bed of his nail, so she worries about this lump.  She notes worsening brain fog--saw GYN yesterday. On HRT. GYN said to give the higher dose more time. Checked thyroid studies and A1c, doesn't have results yet.  Self conscious about speaking with her job. Usually in smaller settings, not in front of a lot of people.  Previously took beta blocker before public speaking (when in [place])--doesn't think she needs this. She does have a lot of stressors/anxiety, and GYN recommended counseling (as we also discussed at prior visit).  Hypercholesterolemia-- Noted after her physical. Lab Results  Component Value Date   CHOL 245 (H) 02/22/2022   HDL 71 02/22/2022   LDLCALC 163 (H) 02/22/2022   TRIG 67 02/22/2022   CHOLHDL 3.5 02/22/2022   She has been working with [employer]. Recently cut back on fat, still on high protein diet. Eating leaner meat, nonfat yogurts, cut back on cheese. Still eating mayo, but using less. Cut back to 1600 cals daily. Lost 2#  PMH, PSH, SH reviewed  Outpatient Encounter Medications as of 04/24/2022  Medication Sig Note   cholecalciferol (VITAMIN D3) 25 MCG (1000 UNIT) tablet Take 1,000 Units by mouth daily.    Cyanocobalamin (VITAMIN B-12) 1000 MCG SUBL Place 1 each under the tongue daily.    estradiol (ESTRACE) 0.5 MG tablet Take 0.5 mg by mouth daily.    FLUoxetine (PROZAC) 10 MG capsule TAKE 1 CAPSULE(10 MG) BY MOUTH DAILY    fluticasone (FLONASE) 50 MCG/ACT nasal spray Place 2 sprays into both nostrils  daily.    loratadine (CLARITIN) 10 MG tablet Take 10 mg by mouth daily.    Creatine Monohydrate POWD Take 5-20 g by mouth 3 (three) times a week. (Patient not taking: Reported on 04/24/2022) 04/24/2022: Last dose several months ago   No facility-administered encounter medications on file as of 04/24/2022.   Allergies  Allergen Reactions   Citric Acid Other (See Comments)    GI UPSET    ROS: no fever, chills, URI symptoms.  +anxiety/stress. +brainfog per HPI. No hot flashes or night sweats, no chest pain, DOE. Lump on finger per HPI.  PHYSICAL EXAM: BP 120/70   Pulse 68   Wt 153 lb (69.4 kg)   LMP 01/28/2018   SpO2 100%   BMI 24.69 kg/m   Wt Readings from Last 3 Encounters:  04/24/22 153 lb (69.4 kg)  02/20/22 155 lb 3.2 oz (70.4 kg)  08/02/21 147 lb 8 oz (66.9 kg)   Well-appearing, pleasant female, in good spirits. She is alert and oriented, cranial nerves intact, conjunctiva and sclera clear, EOMI.  L hand-- There is a prominence overlying the ulnar aspect of L 3rd DIP (dorsum). This is somewhat tender, slightly soft (softer than bone). No erythema, effusion.  Rest of knuckles all appear normal. Normal pulses, brisk cap refill.  ASSESSMENT/PLAN:  Pain of finger of left hand - suspect cyst, will check x-ray to eval for underlying OA or erosive  dz. Refer to ortho hand if worsens/desires tx - Plan: DG Finger Middle Left  Pure hypercholesterolemia - diet improved some. She declines recheck of lipids prior to her next physical. Will cont to work on diet.   Xray of Left middle finger to r/o bony abnl and assess for arthritic/degenerative joint changes. Suspect ganglion/digital/mucus cyst vs nodule (soft tissue).  If increasing in size or pain, refer to hand surgeon.  ?performance anxiety. Could use BB in future. Used in the past. Strongly encouraged therapy

## 2022-05-08 ENCOUNTER — Encounter: Payer: Self-pay | Admitting: Internal Medicine

## 2022-06-10 ENCOUNTER — Ambulatory Visit (INDEPENDENT_AMBULATORY_CARE_PROVIDER_SITE_OTHER): Payer: Managed Care, Other (non HMO) | Admitting: Family Medicine

## 2022-06-10 ENCOUNTER — Encounter: Payer: Self-pay | Admitting: Family Medicine

## 2022-06-10 VITALS — BP 120/72 | HR 64 | Temp 98.1°F | Ht 66.0 in | Wt 156.8 lb

## 2022-06-10 DIAGNOSIS — R220 Localized swelling, mass and lump, head: Secondary | ICD-10-CM

## 2022-06-10 NOTE — Progress Notes (Signed)
Chief Complaint  Patient presents with   Mass    Got stung by bee on top of her head last Wednesday. (Not sure if this is related or not). Same day or day after she noticed tender, hard knot on left side nack of her head. She explains it like when you bump your head but she has not done so.    She got stung at the L side of the crown of her head on Wed 10/4, got stinger out immediately.  It was uncomfortable for a couple of days, better by 10/6. She noticed a lump at the back of the scalp on the left (upper neck at the level of the ear/occiput), the day after the sting.  It is tender to touch.  No significant change since she first noticed it on 10/5. It is less tender, but size hasn't changed. There is an area just medial to this that is also a little swollen No longer hurts to sleep in that side.  PMH, PSH, SH reviewed  Outpatient Encounter Medications as of 06/10/2022  Medication Sig Note   cholecalciferol (VITAMIN D3) 25 MCG (1000 UNIT) tablet Take 1,000 Units by mouth daily.    estradiol (ESTRACE) 1 MG tablet Take 1 tablet by mouth daily.    FLUoxetine (PROZAC) 10 MG capsule TAKE 1 CAPSULE(10 MG) BY MOUTH DAILY    fluticasone (FLONASE) 50 MCG/ACT nasal spray Place 2 sprays into both nostrils daily.    loratadine (CLARITIN) 10 MG tablet Take 10 mg by mouth daily.    Creatine Monohydrate POWD Take 5-20 g by mouth 3 (three) times a week. (Patient not taking: Reported on 04/24/2022) 06/10/2022: Will resume soon   Cyanocobalamin (VITAMIN B-12) 1000 MCG SUBL Place 1 each under the tongue daily. (Patient not taking: Reported on 06/10/2022) 06/10/2022: Ran out   [DISCONTINUED] estradiol (ESTRACE) 0.5 MG tablet Take 0.5 mg by mouth daily.    No facility-administered encounter medications on file as of 06/10/2022.   ROS: No f/c, n/v/d, myalgias. No bleeding, bruising, rash. No URI symptoms, other swollen glands or other concerns.   PHYSICAL EXAM:  BP 120/72   Pulse 64   Temp 98.1 F (36.7 C)  (Tympanic)   Ht 5\' 6"  (6.301 m)   Wt 156 lb 12.8 oz (71.1 kg)   LMP 01/28/2018   BMI 25.31 kg/m   Wt Readings from Last 3 Encounters:  06/10/22 156 lb 12.8 oz (71.1 kg)  04/24/22 153 lb (69.4 kg)  02/20/22 155 lb 3.2 oz (70.4 kg)   Well-appearing, pleasant female in no distress At the L occiput there is a 20mm raised, firm area overlying the bone.  Hard to tell if mobile, very firm. There is a smaller area closer to the midlline (flatter) Mildly tender. No erythema or central pore, no fluctuance.  No cervical lymphadenopathy. Area of sting appears entirely normal.   ASSESSMENT/PLAN:  Lump of scalp  Ddx LAD related to the bite, no e/o infection Cannot r/o EIC or other cyst.  Since not increasing in size and discomfort is improving, continue to monitor. If increasing in size, trial of warm compresses to treat cyst (and contact us for ABX if fever, increase in size, pain). F/u if very large and painful

## 2022-06-10 NOTE — Patient Instructions (Signed)
Since the lump on the left neck is not increasing in size and the discomfort is improving, continue to monitor.  Differential diagnosis is lymphadenopathy (reactive, related to the sting) vs a cyst of the scalp. If it increases in size, trial of warm compresses to treat as a cyst (and contact us for antibiotics if fever, increase in size, pain). Return here for re-evaluation if very large and painful

## 2022-12-05 DIAGNOSIS — N951 Menopausal and female climacteric states: Secondary | ICD-10-CM | POA: Diagnosis not present

## 2022-12-05 DIAGNOSIS — Z6824 Body mass index (BMI) 24.0-24.9, adult: Secondary | ICD-10-CM | POA: Diagnosis not present

## 2022-12-13 ENCOUNTER — Ambulatory Visit: Payer: BC Managed Care – PPO | Admitting: Medical

## 2022-12-13 ENCOUNTER — Encounter: Payer: Self-pay | Admitting: Medical

## 2022-12-13 VITALS — BP 118/80 | HR 82 | Temp 98.1°F | Wt 154.0 lb

## 2022-12-13 DIAGNOSIS — J020 Streptococcal pharyngitis: Secondary | ICD-10-CM

## 2022-12-13 DIAGNOSIS — J029 Acute pharyngitis, unspecified: Secondary | ICD-10-CM | POA: Diagnosis not present

## 2022-12-13 LAB — POCT INFLUENZA A/B
Influenza A, POC: NEGATIVE
Influenza B, POC: NEGATIVE

## 2022-12-13 LAB — POCT RAPID STREP A (OFFICE): Rapid Strep A Screen: POSITIVE — AB

## 2022-12-13 LAB — POC COVID19 BINAXNOW: SARS Coronavirus 2 Ag: NEGATIVE

## 2022-12-13 MED ORDER — AMOXICILLIN 500 MG PO CAPS: 500.0000 mg | ORAL_CAPSULE | Freq: Three times a day (TID) | ORAL | 0 refills | Status: AC

## 2022-12-13 NOTE — Progress Notes (Signed)
Subjective: Chief Complaint  Patient presents with   sore throat, congestion, achiness    Pt states of severe sore throat, drainage, hurts to swallow, lasting 48 hours ago. Sickness has increased since last night. Pt states of headaches. No chills and believes achiness is due to working out.    Here for complaint of illness.  She notes 2 days history of severe sore throat, some postnasal drainage.  No runny nose, no cough, no ear pain, no nausea vomiting diarrhea, no shortness of breath or wheezing.  No chills.  Her son was sick recently with more of cold symptoms, not specifically sore throat.  No strep contacts.  No COVID contacts.  No other aggravating or relieving factors. No other complaint.   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   Strep pharyngitis Yes   Sore throat     Plan: Advised that sore throat etiology appears to be bacterial.  Discussed symptoms, diagnosis, and possible complications including peritonsillar abscess formation.  Advised that they will be infectious for 24 hours after starting antibiotics.  Discussed means of prevention, precautions.  Supportive care recommended including OTC analgesics, salt water gargles, warm fluids, good hydration, and rest.  Discussed signs or symptoms that  would prompt immediate evaluation.   Call or return if worse or not improving in the next 2-3 days.  Patient voiced understanding of diagnosis, recommendations, and treatment plan.  Sabrina Chapman was seen today for sore throat, congestion, achiness.  Diagnoses and all orders for this visit:  Strep pharyngitis -     POC COVID-19 -     Influenza A/B -     Rapid Strep A  Sore throat -     POC COVID-19 -     Influenza A/B -     Rapid Strep A  Other orders -     amoxicillin (AMOXIL) 500 MG capsule; Take 1 capsule (500 mg total) by mouth 3 (three) times daily for 10 days.    F/u prn

## 2022-12-18 DIAGNOSIS — Z6823 Body mass index (BMI) 23.0-23.9, adult: Secondary | ICD-10-CM | POA: Diagnosis not present

## 2022-12-18 DIAGNOSIS — N951 Menopausal and female climacteric states: Secondary | ICD-10-CM | POA: Diagnosis not present

## 2023-02-02 ENCOUNTER — Encounter: Payer: Self-pay | Admitting: Family Medicine

## 2023-02-03 ENCOUNTER — Ambulatory Visit: Payer: BC Managed Care – PPO | Admitting: Family Medicine

## 2023-02-23 NOTE — Progress Notes (Unsigned)
No chief complaint on file.   Sabrina Chapman is a 52 y.o. female who presents for a complete physical.     Saw Dr. Henderson Cloud 4/4 for worsening brain fog. She was on estradiol 1 mg, sleeping well, eating high protein diet, exercising 3-4x/week.  Was having trouble focusing, memory, unable to organize her thoughts well.  A friend suggested trying testosterone.  Wasn't having other menopausal symptoms. Seeing therapist.  Moods/emotions better than last year, not as much depression.  Dr. Henderson Cloud suggested 2 week holiday from prozac.  She was somewhat better at f/u later that month--energy improved, felt less flat/heavy, more emotions. Still had some brain fog and memory issues. 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  She had been doing well on prozac 10mg  (felt "flat" emotionally at higher dose).   As reported above, prozac was stopped in early April.  UPDATE  Her husband improved, is back to work (though she reports he can't really multitask well, so works and cooks dinner, but leaves her with a lot of other responsibilities).  Her daughter, Lauris Poag, is on the autism spectrum, with behavioral issues.  She attends [employer], along with her son, who has learning disabilities.  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 She is doing only what she wants to do, enjoys her job.   Works with trainer 2x/week, has seen Solicitor.  She follow a high protein diet.   Vitamin D deficiency:  Level was low at 24 in the past.  Last level was 47.5 in 01/2021,  when taking 1000 IU daily.  She continues to take 1000 IU daily.\  Hyperlipidemia:  LDL cholesterol went up significantly last year, after significant changes in her diet. She was advised to discuss this with her nutrition coach, in order to make dietary modifications.  Low cholesterol dietary suggestions were made by [place] as well.  She is due for recheck.   Component Ref Range & Units 1 yr ago 3 yr ago 6 yr ago 7 yr ago  Cholesterol, Total 100 - 199 mg/dL 536 High  644 High     Triglycerides 0 - 149 mg/dL 67 66 40 R 54 R  HDL >03 mg/dL 71 92 83 R 78 R  VLDL Cholesterol Cal 5 - 40 mg/dL 11 11    LDL Chol Calc (NIH) 0 - 99 mg/dL 474 High  259 High     Chol/HDL Ratio 0.0 - 4.4 ratio 3.5 2.5 CM 2.6 R 2.9 R      Allergies:  She continues to use Alaway (Ketotifen) prn.  She continues to use claritin year-long.  She uses the [employer] and prn.  She has recurrence of symptoms when she cuts back on these medications.   Immunization History  Administered Date(s) Administered   Influenza Split 07/17/2012   Influenza,inj,Quad PF,6+ Mos 09/15/2013, 07/05/2014, 05/02/2016, 08/19/2018   Influenza-Unspecified 10/14/2017, 06/02/2018, 06/17/2019, 06/20/2020, 07/17/2021, 05/17/2022   Moderna Covid-19 Vaccine Bivalent Booster 56yrs & up 07/17/2021   PFIZER(Purple Top)SARS-COV-2 Vaccination 11/06/2019, 11/27/2019, 06/20/2020   PPD Test 11/25/2012   Tdap 07/16/2012, 08/12/2014   Unspecified SARS-COV-2  Vaccination 05/17/2022   Zoster Recombinat (Shingrix) 02/22/2022   Last Pap smear: 09/2017 with Dr. Henderson Cloud, gets yearly exams. S/p hysterectomy 02/2020 for benign reasons Last mammogram: 03/2022 Last colonoscopy: 07/2020 Dr. Christella Hartigan, normal.  Recheck in 10 years Last DEXA: never Dentist:  Twice yearly Ophtho: yearly Exercise:   Works with a Psychologist, educational 2x/week and gym 3x/week, heavy lifting.  Warms up with cardio (5 mins). Walks  2x/week (45 minutes) in the neighborhood.    PMH, PSH, SH and  FH reviewed and updated     ROS: The patient denies anorexia, fever, headaches,  vision changes, decreased hearing, ear pain, sore throat, breast concerns, chest pain, palpitations, syncope, dyspnea on exertion, cough, swelling, nausea, vomiting, diarrhea, constipation, abdominal pain, melena, hematochezia, indigestion/heartburn, hematuria, dysuria, vaginal bleeding, discharge, odor or itch, genital lesions, joint pains, numbness, tingling, weakness, tremor, suspicious skin lesions, abnormal bleeding/bruising, or enlarged lymph nodes.   Allergies per HPI Brain fog moods Weight ?joint pains?   PHYSICAL EXAM:  LMP 01/28/2018   Wt Readings from Last 3 Encounters:  12/13/22 154 lb (69.9 kg)  06/10/22 156 lb 12.8 oz (71.1 kg)  04/24/22 153 lb (69.4 kg)    General Appearance:    Alert, cooperative, no distress, appears stated age  Head:    Normocephalic, without obvious abnormality, atraumatic  Eyes:    PERRL, conjunctiva/corneas clear, EOM's intact, fundi benign  Ears:    Normal TM's and external ear canals  Nose:   No drainage or sinus tenderness  Throat:   Normal, no lesions  Neck:   Supple, no lymphadenopathy;  thyroid:  no enlargement/ tenderness/nodules; no carotid bruit or JVD  Back:    Spine nontender, no curvature, ROM normal, no CVA  tenderness  Lungs:     Clear to auscultation bilaterally without wheezes, rales or  ronchi; respirations unlabored  Chest Wall:    No tenderness or deformity   Heart:    Regular rate and rhythm, S1 and S2 normal, no murmur, rub or gallop  Breast Exam:    Deferred to GYN  Abdomen:     Soft, non-tender, nondistended, normoactive bowel sounds, no masses, no hepatosplenomegaly  Genitalia:    Deferred to GYN       Extremities:   No clubbing, cyanosis or edema.   Pulses:   2+ and symmetric all extremities  Skin:   Skin color, texture, turgor normal.   Lymph nodes:   Cervical, supraclavicular nodes normal  Neurologic:   Normal strength, sensation  and gait; reflexes 2+ and symmetric throughout                                Psych:   Normal mood, affect, hygiene and grooming     02/20/2022    2:43 PM 02/19/2021    2:05 PM 02/19/2021    1:54 PM 02/17/2020    2:19 PM 02/17/2020    1:43 PM  Depression screen PHQ 2/9  Decreased Interest 0 1 1 2  0  Down, Depressed, Hopeless 0 2 2 1  0  PHQ - 2 Score 0 3 3 3  0  Altered sleeping 0 2  0   Tired, decreased energy 1 2  1    Change in appetite 0 0  1   Feeling bad or failure about yourself  0 1  2   Trouble concentrating 0 0  0   Moving slowly or fidgety/restless 0 0  0   Suicidal  thoughts 0 0  0   PHQ-9 Score 1 8  7    Difficult doing work/chores Not difficult at all Somewhat difficult         02/20/2022    2:44 PM 02/17/2020    2:22 PM  GAD 7 : Generalized Anxiety Score  Nervous, Anxious, on Edge 1 0  Control/stop worrying 0 0  Worry too much - different things 0 0  Trouble relaxing 0 0  Restless 0 0  Easily annoyed or irritable 1 1  Afraid - awful might happen 0 0  Total GAD 7 Score 2 1  Anxiety Difficulty Not difficult at all     ASSESSMENT/PLAN:  DID SHE EVER GET 2ND SHINGRIX? IF NOT, NEEDS--TODAY VS NV WHEN MORE CONVENIENT PER POSS SIDE EFFECTS  NEEDS GAD-7 AND PHQ-9  Lipids, D, cbc, c-met   Discussed monthly self breast exams and yearly mammograms; at least 30 minutes of aerobic activity at least 5 days/week, weight-bearing exercise 2-3x/wk; proper sunscreen use reviewed; healthy diet, including goals of calcium and vitamin D intake and alcohol recommendations (less than or equal to 1 drink/day) reviewed; regular seatbelt use; changing batteries in smoke detectors.  Immunization recommendations discussed--continue yearly flu shots.  Updated bivalent COVID booster is recommended when available (Fall). 2nd Shingrix *** Colonoscopy recommendations reviewed, UTD.  F/u 1 year, sooner prn.

## 2023-02-23 NOTE — Patient Instructions (Signed)

## 2023-02-24 ENCOUNTER — Ambulatory Visit (INDEPENDENT_AMBULATORY_CARE_PROVIDER_SITE_OTHER): Payer: Managed Care, Other (non HMO) | Admitting: Family Medicine

## 2023-02-24 ENCOUNTER — Encounter: Payer: Self-pay | Admitting: Family Medicine

## 2023-02-24 VITALS — BP 130/80 | HR 64 | Ht 66.5 in | Wt 153.4 lb

## 2023-02-24 DIAGNOSIS — R4189 Other symptoms and signs involving cognitive functions and awareness: Secondary | ICD-10-CM | POA: Diagnosis not present

## 2023-02-24 DIAGNOSIS — E7800 Pure hypercholesterolemia, unspecified: Secondary | ICD-10-CM | POA: Diagnosis not present

## 2023-02-24 DIAGNOSIS — Z0000 Encounter for general adult medical examination without abnormal findings: Secondary | ICD-10-CM | POA: Diagnosis not present

## 2023-02-24 DIAGNOSIS — Z8249 Family history of ischemic heart disease and other diseases of the circulatory system: Secondary | ICD-10-CM

## 2023-02-24 DIAGNOSIS — E559 Vitamin D deficiency, unspecified: Secondary | ICD-10-CM

## 2023-02-25 ENCOUNTER — Other Ambulatory Visit (INDEPENDENT_AMBULATORY_CARE_PROVIDER_SITE_OTHER): Payer: BC Managed Care – PPO

## 2023-02-25 DIAGNOSIS — E559 Vitamin D deficiency, unspecified: Secondary | ICD-10-CM | POA: Diagnosis not present

## 2023-02-25 DIAGNOSIS — E7800 Pure hypercholesterolemia, unspecified: Secondary | ICD-10-CM | POA: Diagnosis not present

## 2023-02-25 DIAGNOSIS — Z23 Encounter for immunization: Secondary | ICD-10-CM

## 2023-02-25 DIAGNOSIS — R4189 Other symptoms and signs involving cognitive functions and awareness: Secondary | ICD-10-CM | POA: Diagnosis not present

## 2023-02-25 DIAGNOSIS — Z0000 Encounter for general adult medical examination without abnormal findings: Secondary | ICD-10-CM

## 2023-02-25 LAB — CBC WITH DIFFERENTIAL/PLATELET
Basophils Absolute: 0 10*3/uL (ref 0.0–0.2)
Basos: 0 %
Immature Grans (Abs): 0 10*3/uL (ref 0.0–0.1)
Immature Granulocytes: 0 %
Monocytes Absolute: 0.4 10*3/uL (ref 0.1–0.9)
Neutrophils: 59 %
WBC: 4.1 10*3/uL (ref 3.4–10.8)

## 2023-02-25 LAB — VITAMIN D 25 HYDROXY (VIT D DEFICIENCY, FRACTURES)

## 2023-02-25 LAB — LIPID PANEL

## 2023-02-25 LAB — COMPREHENSIVE METABOLIC PANEL

## 2023-02-25 LAB — TSH

## 2023-02-26 LAB — CBC WITH DIFFERENTIAL/PLATELET
EOS (ABSOLUTE): 0.1 10*3/uL (ref 0.0–0.4)
Eos: 2 %
Hematocrit: 41 % (ref 34.0–46.6)
Hemoglobin: 13.7 g/dL (ref 11.1–15.9)
Lymphocytes Absolute: 1.2 10*3/uL (ref 0.7–3.1)
Lymphs: 29 %
MCH: 31 pg (ref 26.6–33.0)
MCHC: 33.4 g/dL (ref 31.5–35.7)
MCV: 93 fL (ref 79–97)
Monocytes: 10 %
Neutrophils Absolute: 2.4 10*3/uL (ref 1.4–7.0)
Platelets: 290 10*3/uL (ref 150–450)
RBC: 4.42 x10E6/uL (ref 3.77–5.28)
RDW: 12.2 % (ref 11.7–15.4)

## 2023-02-26 LAB — COMPREHENSIVE METABOLIC PANEL
ALT: 22 IU/L (ref 0–32)
AST: 25 IU/L (ref 0–40)
Albumin: 4.1 g/dL (ref 3.8–4.9)
BUN/Creatinine Ratio: 18 (ref 9–23)
Bilirubin Total: 0.6 mg/dL (ref 0.0–1.2)
CO2: 23 mmol/L (ref 20–29)
Chloride: 102 mmol/L (ref 96–106)
Globulin, Total: 2.3 g/dL (ref 1.5–4.5)
Glucose: 98 mg/dL (ref 70–99)
Potassium: 4.8 mmol/L (ref 3.5–5.2)
Total Protein: 6.4 g/dL (ref 6.0–8.5)
eGFR: 98 mL/min/{1.73_m2} (ref >59)

## 2023-02-26 LAB — LIPID PANEL
Chol/HDL Ratio: 3.1 ratio (ref 0.0–4.4)
HDL: 81 mg/dL (ref >39)
LDL Chol Calc (NIH): 160 mg/dL — ABNORMAL HIGH (ref 0–99)
Triglycerides: 86 mg/dL (ref 0–149)
VLDL Cholesterol Cal: 14 mg/dL (ref 5–40)

## 2023-03-01 ENCOUNTER — Encounter: Payer: Self-pay | Admitting: Family Medicine

## 2023-03-05 ENCOUNTER — Ambulatory Visit (HOSPITAL_BASED_OUTPATIENT_CLINIC_OR_DEPARTMENT_OTHER)
Admission: RE | Admit: 2023-03-05 | Discharge: 2023-03-05 | Disposition: A | Discharge status: Home / Self Care | Payer: Self-pay | Source: Ambulatory Visit | Attending: Family Medicine | Admitting: Family Medicine

## 2023-03-05 DIAGNOSIS — E7800 Pure hypercholesterolemia, unspecified: Secondary | ICD-10-CM | POA: Insufficient documentation

## 2023-03-05 DIAGNOSIS — Z8249 Family history of ischemic heart disease and other diseases of the circulatory system: Secondary | ICD-10-CM | POA: Insufficient documentation

## 2023-03-10 DIAGNOSIS — Z1231 Encounter for screening mammogram for malignant neoplasm of breast: Secondary | ICD-10-CM | POA: Diagnosis not present

## 2023-03-10 DIAGNOSIS — Z6824 Body mass index (BMI) 24.0-24.9, adult: Secondary | ICD-10-CM | POA: Diagnosis not present

## 2023-03-10 DIAGNOSIS — Z01419 Encounter for gynecological examination (general) (routine) without abnormal findings: Secondary | ICD-10-CM | POA: Diagnosis not present

## 2023-03-10 LAB — HM MAMMOGRAPHY

## 2023-03-11 ENCOUNTER — Encounter: Payer: Self-pay | Admitting: Family Medicine

## 2023-03-11 DIAGNOSIS — I700 Atherosclerosis of aorta: Secondary | ICD-10-CM

## 2023-03-11 DIAGNOSIS — Z5181 Encounter for therapeutic drug level monitoring: Secondary | ICD-10-CM

## 2023-03-11 DIAGNOSIS — E7800 Pure hypercholesterolemia, unspecified: Secondary | ICD-10-CM

## 2023-03-11 MED ORDER — ROSUVASTATIN CALCIUM 5 MG PO TABS
5.0000 mg | ORAL_TABLET | Freq: Every day | ORAL | 2 refills | Status: DC
Start: 2023-03-11 — End: 2023-05-22

## 2023-03-11 NOTE — Telephone Encounter (Signed)
Scheduled for 05/21/23 @ 8:30am.

## 2023-03-14 ENCOUNTER — Telehealth: Payer: Self-pay | Admitting: Family Medicine

## 2023-03-14 NOTE — Telephone Encounter (Signed)
Medical Records Received from [facility]

## 2023-03-30 ENCOUNTER — Encounter: Payer: Self-pay | Admitting: Family Medicine

## 2023-05-21 ENCOUNTER — Other Ambulatory Visit: Payer: BC Managed Care – PPO

## 2023-05-21 DIAGNOSIS — E7800 Pure hypercholesterolemia, unspecified: Secondary | ICD-10-CM

## 2023-05-21 DIAGNOSIS — Z5181 Encounter for therapeutic drug level monitoring: Secondary | ICD-10-CM

## 2023-05-22 ENCOUNTER — Encounter: Payer: Self-pay | Admitting: Family Medicine

## 2023-05-22 ENCOUNTER — Other Ambulatory Visit: Payer: Self-pay | Admitting: *Deleted

## 2023-05-22 DIAGNOSIS — E7800 Pure hypercholesterolemia, unspecified: Secondary | ICD-10-CM

## 2023-05-22 DIAGNOSIS — I700 Atherosclerosis of aorta: Secondary | ICD-10-CM

## 2023-05-22 LAB — HEPATIC FUNCTION PANEL
ALT: 21 IU/L (ref 0–32)
AST: 24 IU/L (ref 0–40)
Albumin: 4.2 g/dL (ref 3.8–4.9)
Alkaline Phosphatase: 72 IU/L (ref 44–121)
Bilirubin Total: 0.6 mg/dL (ref 0.0–1.2)
Bilirubin, Direct: 0.17 mg/dL (ref 0.00–0.40)
Total Protein: 6.4 g/dL (ref 6.0–8.5)

## 2023-05-22 LAB — LIPID PANEL
Chol/HDL Ratio: 2.5 ratio (ref 0.0–4.4)
Cholesterol, Total: 181 mg/dL (ref 100–199)
HDL: 72 mg/dL (ref >39)
LDL Chol Calc (NIH): 95 mg/dL (ref 0–99)
Triglycerides: 74 mg/dL (ref 0–149)
VLDL Cholesterol Cal: 14 mg/dL (ref 5–40)

## 2023-05-22 MED ORDER — ROSUVASTATIN CALCIUM 5 MG PO TABS
5.0000 mg | ORAL_TABLET | Freq: Every day | ORAL | 2 refills | Status: DC
Start: 2023-05-22 — End: 2024-03-01

## 2023-06-03 DIAGNOSIS — S50911A Unspecified superficial injury of right forearm, initial encounter: Secondary | ICD-10-CM | POA: Diagnosis not present

## 2023-11-06 ENCOUNTER — Ambulatory Visit: Payer: Self-pay | Admitting: Family Medicine

## 2023-11-06 ENCOUNTER — Ambulatory Visit
Admission: RE | Admit: 2023-11-06 | Discharge: 2023-11-06 | Disposition: A | Discharge status: Home / Self Care | Source: Ambulatory Visit | Attending: Family Medicine | Admitting: Family Medicine

## 2023-11-06 VITALS — BP 110/77 | HR 92 | Temp 99.0°F | Resp 17 | Ht 67.0 in | Wt 145.0 lb

## 2023-11-06 DIAGNOSIS — R42 Dizziness and giddiness: Secondary | ICD-10-CM | POA: Diagnosis not present

## 2023-11-06 DIAGNOSIS — A084 Viral intestinal infection, unspecified: Secondary | ICD-10-CM | POA: Diagnosis not present

## 2023-11-06 MED ORDER — LOPERAMIDE HCL 2 MG PO CAPS: 2.0000 mg | ORAL_CAPSULE | Freq: Two times a day (BID) | ORAL | 0 refills | Status: DC | PRN

## 2023-11-06 MED ORDER — ONDANSETRON 8 MG PO TBDP: 8.0000 mg | ORAL_TABLET | Freq: Three times a day (TID) | ORAL | 0 refills | Status: AC | PRN

## 2023-11-06 MED ORDER — MECLIZINE HCL 12.5 MG PO TABS: 12.5000 mg | ORAL_TABLET | Freq: Three times a day (TID) | ORAL | 0 refills | Status: AC | PRN

## 2023-11-06 NOTE — ED Provider Notes (Signed)
 [name]  Note:  This document was prepared using Conservation officer, historic buildings and may include unintentional dictation errors.  MRN: [id number] DOB: 1971-02-24  Subjective:   Sabrina Chapman is a 53 y.o. female presenting for 2-day history of onset vomiting, diarrhea, nausea.  Has been getting like vertigo which is common for her when she gets dehydrated.  No bloody stools.  No fever, recent antibiotic use, hospitalizations or long distance travel.  Has not eaten raw foods, drank unfiltered water.  No history of GI disorders including Crohn's, IBS, ulcerative colitis.  She does have sensitivity to citric acid.  No current facility-administered medications for this encounter.  Current Outpatient Medications:    cholecalciferol (VITAMIN D3) 25 MCG (1000 UNIT) tablet, Take 1,000 Units by mouth daily., Disp: , Rfl:    Cyanocobalamin (VITAMIN B-12) 1000 MCG SUBL, Place 1 each under the tongue daily., Disp: , Rfl:    estradiol (ESTRACE) 1 MG tablet, Take 1 tablet by mouth daily., Disp: , Rfl:    loratadine (CLARITIN) 10 MG tablet, Take 10 mg by mouth daily., Disp: , Rfl:    Omega-3 Fatty Acids (FISH OIL) 1000 MG CPDR, Take 2,000 mg by mouth daily., Disp: , Rfl:    rosuvastatin (CRESTOR) 5 MG tablet, Take 1 tablet (5 mg total) by mouth daily., Disp: 90 tablet, Rfl: 2   THEANINE 100 PO, Take 2 capsules by mouth daily., Disp: , Rfl:    Creatine Monohydrate POWD, Take 5-20 g by mouth 3 (three) times a week. (Patient not taking: Reported on 02/24/2023), Disp: , Rfl:    fluticasone (FLONASE) 50 MCG/ACT nasal spray, Place 2 sprays into both nostrils daily. (Patient not taking: Reported on 12/13/2022), Disp: , Rfl:    Allergies  Allergen Reactions   Citric Acid Other (See Comments)    GI UPSET    Past Medical History:  Diagnosis Date   Anxiety in her early 20's   took Paxil   Dental crown present    Depression 11/2018   started on Prozac by GYN    Macromastia 07/2018     Past Surgical History:  Procedure Laterality Date   BILATERAL SALPINGECTOMY  09/23/2014   BLADDER SUSPENSION N/A 02/23/2020   Procedure: TRANSVAGINAL TAPE (TVT) PROCEDURE;  Surgeon: Carrington Clamp, MD;  Location: [facility] [place];  Service: Gynecology;  Laterality: N/A;   BREAST REDUCTION SURGERY Bilateral 07/24/2018   Procedure: BILATERAL MAMMARY REDUCTION  (BREAST);  Surgeon: Glenna Fellows, MD;  Location: [facility];  Service: Plastics;  Laterality: Bilateral;   BUNIONECTOMY Right 01/28/2017   CESAREAN SECTION  07/15/2012   Procedure: CESAREAN SECTION;  Surgeon: Loney Laurence, MD;  Location: [facility];  Service: Obstetrics;  Laterality: N/A;   CESAREAN SECTION N/A 09/23/2014   Procedure: CESAREAN SECTION;  Surgeon: Loney Laurence, MD;  Location: [facility];  Service: Obstetrics;  Laterality: N/A;   CYSTOCELE REPAIR N/A 02/23/2020   Procedure: ANTERIOR REPAIR (CYSTOCELE);  Surgeon: Carrington Clamp, MD;  Location: [facility];  Service: Gynecology;  Laterality: N/A;   CYSTOSCOPY N/A 02/23/2020   Procedure: CYSTOSCOPY;  Surgeon: Carrington Clamp, MD;  Location: [facility];  Service: Gynecology;  Laterality: N/A;   CYSTOSCOPY WITH INJECTION N/A 08/01/2021   Procedure: CYSTOSCOPY WITH INJECTION OF INDOCYANINE GREEN DYE;  Surgeon: Sebastian Ache, MD;  Location: [facility];  Service: Urology;  Laterality: N/A;   DILATION AND CURETTAGE OF UTERUS  2008   NASAL SINUS SURGERY  as a child   ROBOT ASSISTED LAPAROSCOPIC COMPLETE CYSTECT ILEAL CONDUIT N/A 08/01/2021   Procedure: XI ROBOTIC ASSISTED LAPAROSCOPIC REMOVAL OF BLADDER MESH;  Surgeon: Sebastian Ache, MD;  Location: [facility];  Service: Urology;  Laterality: N/A;   ROBOTIC ASSISTED TOTAL HYSTERECTOMY WITH BILATERAL SALPINGO OOPHERECTOMY Bilateral 02/23/2020   Procedure: XI ROBOTIC ASSISTED TOTAL HYSTERECTOMY WITH BILATERAL SALPINGO OOPHORECTOMY;  Surgeon:  Carrington Clamp, MD;  Location: [facility];  Service: Gynecology;  Laterality: Bilateral;   TUBAL LIGATION      Family History  Problem Relation Age of Onset   Cancer Mother        breast @62 , ovarian @65    Hyperlipidemia Mother    Heart disease Mother 60       by pass surgery   Breast cancer Mother 49   Ovarian cancer Mother 74       metastatic to abd/omentum (diagnosed after finding pleural effusions)   Hyperlipidemia Father    Allergies Father    Depression Father    Cancer Maternal Grandmother 20       breast cancer   Breast cancer Maternal Grandmother    Heart disease Maternal Grandfather    Depression Paternal Grandmother    Colon polyps Neg Hx    Esophageal cancer Neg Hx    Colon cancer Neg Hx    Rectal cancer Neg Hx    Stomach cancer Neg Hx     Social History   Tobacco Use   Smoking status: Former    Current packs/day: 0.00    Average packs/day: 0.3 packs/day for 4.0 years (1.0 ttl pk-yrs)    Types: Cigarettes    Start date: 09/01/1993    Quit date: 09/01/1997    Years since quitting: 26.1   Smokeless tobacco: Never  Vaping Use   Vaping status: Never Used  Substance Use Topics   Alcohol use: Not Currently    Comment: infrequent   Drug use: No    ROS   Objective:   Vitals: BP 110/77 (BP Location: Right Arm)   Pulse 92   Temp 99 F (37.2 C) (Oral)   Resp 17   Ht 5\' 7"  (1.702 m)   Wt 145 lb (65.8 kg)   LMP 01/28/2018   SpO2 96%   BMI 22.71 kg/m   Physical Exam Constitutional:      General: She is not in acute distress.    Appearance: Normal appearance. She is well-developed. She is not ill-appearing, toxic-appearing or diaphoretic.  HENT:     Head: Normocephalic and atraumatic.     Nose: Nose normal.     Mouth/Throat:     Mouth: Mucous membranes are moist.     Pharynx: Oropharynx is clear.  Eyes:     General: No scleral icterus.       Right eye: No discharge.        Left eye: No discharge.     Extraocular  Movements: Extraocular movements intact.     Conjunctiva/sclera: Conjunctivae normal.  Cardiovascular:     Rate and Rhythm: Normal rate.  Pulmonary:     Effort: Pulmonary effort is normal.  Abdominal:     General: Bowel sounds are increased. There is no distension.     Palpations: Abdomen is soft. There is no mass.     Tenderness: There is no abdominal tenderness. There is no right CVA tenderness, left CVA tenderness, guarding or rebound.  Skin:    General: Skin is warm and dry.  Neurological:  General: No focal deficit present.     Mental Status: She is alert and oriented to person, place, and time.  Psychiatric:        Mood and Affect: Mood normal.        Behavior: Behavior normal.        Thought Content: Thought content normal.        Judgment: Judgment normal.     Assessment and Plan :   PDMP not reviewed this encounter.  1. Viral gastroenteritis   2. Vertigo    Will manage for suspected viral gastroenteritis with supportive care.  Recommended patient hydrate well, eat light meals and maintain electrolytes.  Will use Zofran and Imodium for nausea, vomiting and diarrhea.  Meclizine for the vertigo.  Counseled patient on potential for adverse effects with medications prescribed/recommended today, ER and return-to-clinic precautions discussed, patient verbalized understanding.    Wallis Bamberg, PA-C 11/06/23 1426

## 2023-11-06 NOTE — Discharge Instructions (Signed)
 Make sure you push fluids drinking mostly water but mix it with Gatorade.  Try to eat light meals including soups, broths and soft foods, fruits.  You may use Zofran for your nausea and vomiting once every 8 hours.  Imodium can help with diarrhea but use this carefully limiting it to 1-2 times per day only if you are having a lot of diarrhea.  Use meclizine as needed for vertigo. Please return to the clinic if symptoms worsen or you start having severe abdominal pain not helped by taking Tylenol or start having bloody stools or blood in the vomit.

## 2023-11-06 NOTE — ED Triage Notes (Signed)
 Pt states that she has been vomiting. Pt states that she also has some diarrhea. X2 days

## 2023-11-06 NOTE — Telephone Encounter (Signed)
 Copied from CRM [id number]. Topic: Clinical - Red Word Triage >> Nov 06, 2023  8:05 AM Gaetano Hawthorne wrote: Red Word that prompted transfer to Nurse Triage: Patient has been experiencing episodes of vomiting and diarrhea on Tuesday and Wednesday - Patient feels dehryated and mentioned that she has some issues with Vertigo.  Chief Complaint: Vomiting and Diarrhea Symptoms: vomiting, diarrhea, headache, vertigo, dry mouth and lips, only sipping liquids Frequency: since Tuesday Pertinent Negatives: Patient denies fever, sob Disposition: [x] ED /[] Urgent Care (no appt availability in office) / [] Appointment(In office/virtual)/ []  [facility]/ [] Home Care/ [] Refused Recommended Disposition /[] [facility]/ []  Follow-up with PCP Additional Notes: per protocol instructed to go to the ER.  Care advice given, denies questions, pcp office updated.   Reason for Disposition  [1] Drinking very little AND [2] dehydration suspected (e.g., no urine > 12 hours, very dry mouth, very lightheaded)  Answer Assessment - Initial Assessment Questions 1. VOMITING SEVERITY: "How many times have you vomited in the past 24 hours?"     - MILD:  1 - 2 times/day    - MODERATE: 3 - 5 times/day, decreased oral intake without significant weight loss or symptoms of dehydration    - SEVERE: 6 or more times/day, vomits everything or nearly everything, with significant weight loss, symptoms of dehydration      Mild, vomiting x2 today 2. ONSET: "When did the vomiting begin?"      Started Tuesday 3. FLUIDS: "What fluids or food have you vomited up today?" "Have you been able to keep any fluids down?"     Sipping on water 4. ABDOMEN PAIN: "Are your having any abdomen pain?" If Yes : "How bad is it and what does it feel like?" (e.g., crampy, dull, intermittent, constant)      denies 5. DIARRHEA: "Is there any diarrhea?" If Yes, ask: "How many times today?"      X 1 today 6. CONTACTS: "Is there anyone else in the family  with the same symptoms?"      Son was ill 7. CAUSE: "What do you think is causing your vomiting?"     unknown 8. HYDRATION STATUS: "Any signs of dehydration?" (e.g., dry mouth [not only dry lips], too weak to stand) "When did you last urinate?"     Weak, lips and mouth dry 9. OTHER SYMPTOMS: "Do you have any other symptoms?" (e.g., fever, headache, vertigo, vomiting blood or coffee grounds, recent head injury)     Vertigo, headache 10. PREGNANCY: "Is there any chance you are pregnant?" "When was your last menstrual period?"       NA  Protocols used: Vomiting-A-AH

## 2023-11-20 DIAGNOSIS — R4189 Other symptoms and signs involving cognitive functions and awareness: Secondary | ICD-10-CM | POA: Diagnosis not present

## 2024-02-28 ENCOUNTER — Other Ambulatory Visit: Payer: Self-pay | Admitting: Family Medicine

## 2024-02-28 DIAGNOSIS — E7800 Pure hypercholesterolemia, unspecified: Secondary | ICD-10-CM

## 2024-02-28 DIAGNOSIS — I700 Atherosclerosis of aorta: Secondary | ICD-10-CM

## 2024-02-29 NOTE — Patient Instructions (Incomplete)
   HEALTH MAINTENANCE RECOMMENDATIONS:  It is recommended that you get at least 30 minutes of aerobic exercise at least 5 days/week (for weight loss, you may need as much as 60-90 minutes). This can be any activity that gets your heart rate up. This can be divided in 10-15 minute intervals if needed, but try and build up your endurance at least once a week.  Weight bearing exercise is also recommended twice weekly.  Eat a healthy diet with lots of vegetables, fruits and fiber.  "Colorful" foods have a lot of vitamins (ie green vegetables, tomatoes, red peppers, etc).  Limit sweet tea, regular sodas and alcoholic beverages, all of which has a lot of calories and sugar.  Up to 1 alcoholic drink daily may be beneficial for women (unless trying to lose weight, watch sugars).  Drink a lot of water.  Calcium recommendations are 1200-1500 mg daily (1500 mg for postmenopausal women or women without ovaries), and vitamin D 1000 IU daily.  This should be obtained from diet and/or supplements (vitamins), and calcium should not be taken all at once, but in divided doses.  Monthly self breast exams and yearly mammograms for women over the age of 109 is recommended.  Sunscreen of at least SPF 30 should be used on all sun-exposed parts of the skin when outside between the hours of 10 am and 4 pm (not just when at beach or pool, but even with exercise, golf, tennis, and yard work!)  Use a sunscreen that says "broad spectrum" so it covers both UVA and UVB rays, and make sure to reapply every 1-2 hours.  Remember to change the batteries in your smoke detectors when changing your clock times in the spring and fall. Carbon monoxide detectors are recommended for your home.  Use your seat belt every time you are in a car, and please drive safely and not be distracted with cell phones and texting while driving.

## 2024-02-29 NOTE — Progress Notes (Unsigned)
 No chief complaint on file.   Sabrina Chapman is a 53 y.o. female who presents for a complete physical.   She see Dr. Sarrah yearly for GYN exams, last 11/2023.  She had come off of prozac  as suggested by Dr. Sarrah, due to worsening brain fog. She stopped the Prozac  in 12/2022, and at her physical last year, she reported doing much better--only getting rare brain fog episodes (senior moments), and moods remained okay. At 11/2023 visit with Dr. Sarrah, she noted trouble coming up with words, trouble with spoken communication.   Stressors have included husband who had a stroke, doesn't multitask well (but is working and cooks dinner). Her daughter, Raguel, is on the autism spectrum, with behavioral issues.  She attends [employer], along with her son, who has learning disabilities. Raguel struggles more over the summer, when doesn't have the structure and routine of school. She has noted some cognitive changes in her father, who helps with the kids.  He has seen neuro, not on meds. She has had some financial stress, as her work has been somewhat inconsistent. 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 She has been seeing a therapist every other week. She has realized that some of her loss of joy is related to not having many friends--she is working on that. ***UPDATE    Vitamin D  deficiency:  Level was low at 24 in the past.  Last level was 44.6 in 02/2023, when taking 1000 IU daily.  She continues to take 1000 IU daily.  Hyperlipidemia:  LDL had remained in the 160 range last year and the prior year. She had been eating a high protein diet, with 14 eggs/week, mayonnaise, beef 4-5x/week. Calcium  score was 0, but aortic atherosclerosis was noted (minimal, in aortic arch), scan 03/2023.   She was started on 5 mg of rosuvastatin  last year, and she made some dietary changes. Lipids were much better  on recheck in 05/2023.  She currently reports eating only egg whites (cut out the yolks), cut out mayonnaise, and cut back on red meat.  She now has ***  Cheese--on sandwiches, not often other times. No creamy sauces/soups/dressings.   Component Ref Range & Units (hover) 9 mo ago 1 yr ago 2 yr ago 4 yr ago 7 yr ago 8 yr ago  Cholesterol, Total 181 255 High  245 High  226 High     Triglycerides 74 86 67 66 40 R 54 R  HDL 72 81 71 92 83 R 78 R  VLDL Cholesterol Cal 14 14 11 11     LDL Chol Calc (NIH) 95 160 High  163 High  123 High     Chol/HDL Ratio 2.5 3.1 CM 3.5 CM 2.5 CM 2.6 R 2.9 R     Allergies:  She continues to use claritin year-long.  She uses the Flonase  seasonally and prn.     Immunization History  Administered Date(s) Administered   Influenza Split 07/17/2012   Influenza,inj,Quad PF,6+ Mos 09/15/2013, 07/05/2014, 05/02/2016, 08/19/2018   Influenza-Unspecified 10/14/2017, 06/02/2018, 06/17/2019, 06/20/2020, 07/17/2021, 05/17/2022   Moderna Covid-19 Vaccine Bivalent Booster 70yrs & up 07/17/2021   PFIZER(Purple Top)SARS-COV-2 Vaccination 11/06/2019, 11/27/2019, 06/20/2020   PPD Test 11/25/2012   Tdap 07/16/2012, 08/12/2014   Unspecified SARS-COV-2 Vaccination 05/17/2022   Zoster Recombinant(Shingrix ) 02/22/2022, 02/25/2023   Last Pap smear: 09/2017 with Dr. Sarrah, gets yearly exams. S/p hysterectomy 02/2020 for benign reasons Last mammogram: 03/2023 Last colonoscopy:  07/2020 Dr. Teressa, normal.  Recheck in 10 years Last DEXA: never Dentist:  Twice yearly Ophtho: every other year Exercise:   Walks at least 3 days/week, x 30 mins (sometimes an hour). Weights at least 2x/week. No longer working with trainer. Pickleball class.  Saw dermatologist 06/2023 (Dr. Elnor)  PMH, PSH, SH and FH reviewed and updated   ROS: The patient denies anorexia, fever, headaches,  vision changes, decreased hearing, ear pain, sore throat, breast concerns, chest pain, palpitations,  syncope, dyspnea on exertion, cough, swelling, nausea, vomiting, diarrhea, constipation, abdominal pain, melena, hematochezia, indigestion/heartburn, hematuria, dysuria, vaginal bleeding, discharge, odor or itch, genital lesions, joint pains, numbness, tingling, weakness, tremor, suspicious skin lesions, abnormal bleeding/bruising, or enlarged lymph nodes.   Wakes up frequently with numbness in her hands. Seems to be the side she is sleeping on, positional. Allergies are controlled. Brain fog improved since off prozac  Moods remain good overall Weight--trouble losing at her abdomen Bruising easier   PHYSICAL EXAM:  LMP 01/28/2018   Wt Readings from Last 3 Encounters:  11/06/23 145 lb (65.8 kg)  02/24/23 153 lb 6.4 oz (69.6 kg)  12/13/22 154 lb (69.9 kg)  CPE 01/2022 155# 3.2 oz  General Appearance:    Alert, cooperative, no distress, appears stated age. Elected not to change into gown for today's visit.  Head:    Normocephalic, without obvious abnormality, atraumatic  Eyes:    PERRL, conjunctiva/corneas clear, EOM's intact, fundi benign  Ears:    Normal TM's and external ear canals  Nose:   No drainage or sinus tenderness  Throat:   Normal, no lesions  Neck:   Supple, no lymphadenopathy;  thyroid:  no enlargement/ tenderness/nodules; no carotid bruit or JVD  Back:    Spine nontender, no curvature, ROM normal, no CVA  tenderness  Lungs:     Clear to auscultation bilaterally without wheezes, rales or  ronchi; respirations unlabored  Chest Wall:    No tenderness or deformity   Heart:    Regular rate and rhythm, S1 and S2 normal, no murmur, rub or gallop  Breast Exam:    Deferred to GYN  Abdomen:     Soft, non-tender, nondistended, normoactive bowel sounds, no masses, no hepatosplenomegaly  Genitalia:    Deferred to GYN       Extremities:   No clubbing, cyanosis or edema.   Pulses:   2+ and symmetric all extremities  Skin:   Skin color, texture, turgor normal. Exam is limited, not  changed into gown.  Lymph nodes:   Cervical, supraclavicular nodes normal  Neurologic:   Normal strength, sensation and gait; reflexes 2+ and symmetric throughout                                Psych:   Normal mood, affect, hygiene and grooming     02/24/2023    1:54 PM 02/20/2022    2:43 PM 02/19/2021    2:05 PM 02/19/2021    1:54 PM 02/17/2020    2:19 PM  Depression screen PHQ 2/9  Decreased Interest 0 0 1 1 2   Down, Depressed, Hopeless 0 0 2 2 1   PHQ - 2 Score 0 0 3 3 3   Altered sleeping 0 0 2  0  Tired, decreased energy 0 1 2  1   Change in appetite 0 0 0  1  Feeling bad or failure about yourself  0 0 1  2  Trouble  concentrating 0 0 0  0  Moving slowly or fidgety/restless 0 0 0  0  Suicidal thoughts 0 0 0  0  PHQ-9 Score 0 1 8  7   Difficult doing work/chores Not difficult at all Not difficult at all Somewhat difficult        02/24/2023    1:55 PM 02/20/2022    2:44 PM 02/17/2020    2:22 PM  GAD 7 : Generalized Anxiety Score  Nervous, Anxious, on Edge 0 1 0  Control/stop worrying 0 0 0  Worry too much - different things 0 0 0  Trouble relaxing 0 0 0  Restless 0 0 0  Easily annoyed or irritable 0 1 1  Afraid - awful might happen 0 0 0  Total GAD 7 Score 0 2 1  Anxiety Difficulty Not difficult at all Not difficult at all     ASSESSMENT/PLAN:  PHQ9 and GAD7  Did she get flu or COVID booster? Needs Tdap Will discuss prevnar (her choice if she wants both; I'd do TdaP first if she doesn't want both).  Doesn't need D3 level if still taking 1000 IU daily (unless pt requests to have it done; was fine last year on this dose)  ***REFILL CRESTOR  AFTER LABS BACK  Discussed monthly self breast exams and yearly mammograms; at least 30 minutes of aerobic activity at least 5 days/week, weight-bearing exercise 2-3x/wk; proper sunscreen use reviewed; healthy diet, including goals of calcium  and vitamin D  intake and alcohol recommendations (less than or equal to 1 drink/day) reviewed;  regular seatbelt use; changing batteries in smoke detectors.  Immunization recommendations discussed--continue yearly flu shots.  Updated bivalent COVID booster is recommended when available (Fall). TdaP *** Prevnar-20 *** Colonoscopy recommendations reviewed, UTD.  F/u 1 year, sooner prn.

## 2024-03-01 ENCOUNTER — Encounter: Payer: Self-pay | Admitting: Family Medicine

## 2024-03-01 ENCOUNTER — Ambulatory Visit: Payer: BC Managed Care – PPO | Admitting: Family Medicine

## 2024-03-01 VITALS — BP 128/80 | HR 72 | Ht 66.0 in | Wt 146.2 lb

## 2024-03-01 DIAGNOSIS — Z0000 Encounter for general adult medical examination without abnormal findings: Secondary | ICD-10-CM

## 2024-03-01 DIAGNOSIS — E7800 Pure hypercholesterolemia, unspecified: Secondary | ICD-10-CM

## 2024-03-01 DIAGNOSIS — F418 Other specified anxiety disorders: Secondary | ICD-10-CM

## 2024-03-01 DIAGNOSIS — Z5181 Encounter for therapeutic drug level monitoring: Secondary | ICD-10-CM | POA: Diagnosis not present

## 2024-03-01 DIAGNOSIS — R42 Dizziness and giddiness: Secondary | ICD-10-CM

## 2024-03-01 DIAGNOSIS — F419 Anxiety disorder, unspecified: Secondary | ICD-10-CM | POA: Diagnosis not present

## 2024-03-01 DIAGNOSIS — I700 Atherosclerosis of aorta: Secondary | ICD-10-CM

## 2024-03-01 DIAGNOSIS — F458 Other somatoform disorders: Secondary | ICD-10-CM

## 2024-03-01 DIAGNOSIS — E559 Vitamin D deficiency, unspecified: Secondary | ICD-10-CM

## 2024-03-01 DIAGNOSIS — L309 Dermatitis, unspecified: Secondary | ICD-10-CM

## 2024-03-01 DIAGNOSIS — G44209 Tension-type headache, unspecified, not intractable: Secondary | ICD-10-CM | POA: Diagnosis not present

## 2024-03-01 DIAGNOSIS — Z23 Encounter for immunization: Secondary | ICD-10-CM

## 2024-03-01 DIAGNOSIS — Z8249 Family history of ischemic heart disease and other diseases of the circulatory system: Secondary | ICD-10-CM

## 2024-03-01 DIAGNOSIS — F411 Generalized anxiety disorder: Secondary | ICD-10-CM

## 2024-03-01 LAB — LIPID PANEL

## 2024-03-01 MED ORDER — TRIAMCINOLONE ACETONIDE 0.1 % EX CREA: 1.0000 | TOPICAL_CREAM | Freq: Two times a day (BID) | CUTANEOUS | 0 refills | Status: DC | PRN

## 2024-03-01 MED ORDER — BUSPIRONE HCL 15 MG PO TABS: ORAL_TABLET | ORAL | 1 refills | Status: DC

## 2024-03-01 MED ORDER — PROPRANOLOL HCL 10 MG PO TABS: ORAL_TABLET | ORAL | 1 refills | Status: AC

## 2024-03-02 ENCOUNTER — Ambulatory Visit: Payer: Self-pay | Admitting: Family Medicine

## 2024-03-02 ENCOUNTER — Encounter: Payer: Self-pay | Admitting: Family Medicine

## 2024-03-02 LAB — COMPREHENSIVE METABOLIC PANEL WITH GFR
ALT: 18 IU/L (ref 0–32)
AST: 25 IU/L (ref 0–40)
Albumin: 4.5 g/dL (ref 3.8–4.9)
Alkaline Phosphatase: 78 IU/L (ref 44–121)
BUN/Creatinine Ratio: 14 (ref 9–23)
BUN: 11 mg/dL (ref 6–24)
Bilirubin Total: 0.7 mg/dL (ref 0.0–1.2)
CO2: 20 mmol/L (ref 20–29)
Calcium: 8.9 mg/dL (ref 8.7–10.2)
Chloride: 102 mmol/L (ref 96–106)
Creatinine, Ser: 0.76 mg/dL (ref 0.57–1.00)
Globulin, Total: 3 g/dL (ref 1.5–4.5)
Glucose: 84 mg/dL (ref 70–99)
Potassium: 4.1 mmol/L (ref 3.5–5.2)
Sodium: 139 mmol/L (ref 134–144)
Total Protein: 7.5 g/dL (ref 6.0–8.5)
eGFR: 94 mL/min/{1.73_m2} (ref >59)

## 2024-03-02 LAB — CBC WITH DIFFERENTIAL/PLATELET
Basophils Absolute: 0 10*3/uL (ref 0.0–0.2)
Basos: 1 %
EOS (ABSOLUTE): 0 10*3/uL (ref 0.0–0.4)
Eos: 1 %
Hematocrit: 41.9 % (ref 34.0–46.6)
Hemoglobin: 13.9 g/dL (ref 11.1–15.9)
Immature Grans (Abs): 0 10*3/uL (ref 0.0–0.1)
Immature Granulocytes: 0 %
Lymphocytes Absolute: 0.9 10*3/uL (ref 0.7–3.1)
Lymphs: 26 %
MCH: 31.3 pg (ref 26.6–33.0)
MCHC: 33.2 g/dL (ref 31.5–35.7)
MCV: 94 fL (ref 79–97)
Monocytes Absolute: 0.3 10*3/uL (ref 0.1–0.9)
Monocytes: 9 %
Neutrophils Absolute: 2.1 10*3/uL (ref 1.4–7.0)
Neutrophils: 63 %
Platelets: 274 10*3/uL (ref 150–450)
RBC: 4.44 x10E6/uL (ref 3.77–5.28)
RDW: 12.7 % (ref 11.7–15.4)
WBC: 3.4 10*3/uL (ref 3.4–10.8)

## 2024-03-02 LAB — LIPID PANEL
Cholesterol, Total: 201 mg/dL — AB (ref 100–199)
HDL: 84 mg/dL (ref >39)
LDL CALC COMMENT:: 2.4 ratio (ref 0.0–4.4)
LDL Chol Calc (NIH): 101 mg/dL — AB (ref 0–99)
Triglycerides: 88 mg/dL (ref 0–149)
VLDL Cholesterol Cal: 16 mg/dL (ref 5–40)

## 2024-03-02 MED ORDER — ROSUVASTATIN CALCIUM 5 MG PO TABS: ORAL_TABLET | ORAL | 3 refills | Status: DC

## 2024-03-18 ENCOUNTER — Encounter: Payer: Self-pay | Admitting: Family Medicine

## 2024-03-18 ENCOUNTER — Encounter: Payer: Self-pay | Admitting: Neurology

## 2024-04-01 DIAGNOSIS — H40013 Open angle with borderline findings, low risk, bilateral: Secondary | ICD-10-CM | POA: Diagnosis not present

## 2024-04-05 ENCOUNTER — Encounter: Payer: Self-pay | Admitting: Family Medicine

## 2024-04-08 DIAGNOSIS — Z1231 Encounter for screening mammogram for malignant neoplasm of breast: Secondary | ICD-10-CM | POA: Diagnosis not present

## 2024-04-08 DIAGNOSIS — Z01419 Encounter for gynecological examination (general) (routine) without abnormal findings: Secondary | ICD-10-CM | POA: Diagnosis not present

## 2024-04-09 DIAGNOSIS — N760 Acute vaginitis: Secondary | ICD-10-CM | POA: Diagnosis not present

## 2024-04-18 NOTE — Progress Notes (Unsigned)
 No chief complaint on file.   She sent message 8/4-- when sleeping on my left side, my heart hurts. It's really disturbing and I think I failed to mention it at my last appointment. I've noticed it more in the last month or so since I saw you. Saturday night I woke up in the middle of the night because I turned from my left side to my back in my sleep and had a warm and tingling sensation in my whole chest. It freaked me out. I would like to explore heart health with you or with a cardiologist. With my mom's history and the history of her paternal side of the family I am very concerned about these symptoms. (All of mom's dad's family had severe heart disease, and as you know, my mom had multiple bypass surgery two years before she died of cancer). I've had an occasional dull ache in my chest on the left side when not sleeping also. I understand that anxiety does not help, and I have been cutting anxiety producing things out of my life since we met. But potential heart issues is anxiety producing. I took BuSpar  once and had severe dizziness, and I have not taken it since. 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 I can take that for heart health as well, but it does make me tired by the end of the day.  Her appointment with Dr. Skeet is in late November 2025.  In that same MyChart conversation, she asked about getting lipoprotein (a) checked.  Last lipids: Lab Results  Component Value Date   CHOL 201 (H) 03/01/2024   HDL 84 03/01/2024   LDLCALC 101 (H) 03/01/2024   TRIG 88 03/01/2024   CHOLHDL 2.4 03/01/2024   This is on 5 mg of rosuvastatin  daily (started due to aortic athersclerosis--which was very mild, noted on coronary calcium  scan). Calcium  score was 0 in 03/2023.  Anxiety-- We had asked her to retry buspar , starting 5mg  at bedtime (since she only tried 1 pill, which caused dizziness,  ? If related since she also mentioned vestibular migraines)     03/01/2024    8:48 AM 02/24/2023    1:55 PM 02/20/2022    2:44 PM 02/17/2020    2:22 PM  GAD 7 : Generalized Anxiety Score  Nervous, Anxious, on Edge 2 0 1 0  Control/stop worrying 1 0 0 0  Worry too much - different things 1 0 0 0  Trouble relaxing 0 0 0 0  Restless 1 0 0 0  Easily annoyed or irritable 2 0 1 1  Afraid - awful might happen 0 0 0 0  Total GAD 7 Score 7 0 2 1  Anxiety Difficulty Not difficult at all Not difficult at all Not difficult at all       PMH, PSH, SH reviewed   ROS:    PHYSICAL EXAM:  LMP 01/28/2018   Wt Readings from Last 3 Encounters:  03/01/24 146 lb 3.2 oz (66.3 kg)  11/06/23 145 lb (65.8 kg)  02/24/23 153 lb 6.4 oz (69.6 kg)       ASSESSMENT/PLAN:

## 2024-04-19 ENCOUNTER — Encounter: Payer: Self-pay | Admitting: Family Medicine

## 2024-04-19 ENCOUNTER — Ambulatory Visit: Admitting: Family Medicine

## 2024-04-19 VITALS — BP 130/80 | HR 64 | Ht 66.0 in | Wt 149.0 lb

## 2024-04-19 DIAGNOSIS — F411 Generalized anxiety disorder: Secondary | ICD-10-CM

## 2024-04-19 DIAGNOSIS — R079 Chest pain, unspecified: Secondary | ICD-10-CM

## 2024-04-19 DIAGNOSIS — Z8249 Family history of ischemic heart disease and other diseases of the circulatory system: Secondary | ICD-10-CM | POA: Diagnosis not present

## 2024-04-19 NOTE — Patient Instructions (Addendum)
 Feel free to re-try the buspar , since I'm not convinced that the dizziness is necessarily from it (since you took it for a week at night with only one day where you didn't feel well).  Ideally, the goal is to be able to take 7.5 mg (1/2 tablet) twice daily, but start at 5 mg, once a day, then twice a day, then gradually increase to the 7.5 mg twice daily. Use meclizine  as needed for dizziness.  I suspect your chest pain is musculoskeletal (inflammation of the cartilage next to the breastbone). Use heat (or ice), anti-inflammatories (topical such as voltaren gel, or over-the-counter like aleve  (1-2 pills with food, twice daily for up to 2 weeks, if needed)).

## 2024-04-20 ENCOUNTER — Ambulatory Visit: Payer: Self-pay | Admitting: Family Medicine

## 2024-04-20 DIAGNOSIS — I700 Atherosclerosis of aorta: Secondary | ICD-10-CM

## 2024-04-20 DIAGNOSIS — E7800 Pure hypercholesterolemia, unspecified: Secondary | ICD-10-CM

## 2024-04-20 LAB — LIPOPROTEIN A (LPA): Lipoprotein (a): 199.4 nmol/L — ABNORMAL HIGH (ref <75.0)

## 2024-04-20 MED ORDER — ROSUVASTATIN CALCIUM 10 MG PO TABS: 10.0000 mg | ORAL_TABLET | Freq: Every day | ORAL | 0 refills | Status: AC

## 2024-04-21 ENCOUNTER — Other Ambulatory Visit: Payer: Self-pay | Admitting: *Deleted

## 2024-04-21 DIAGNOSIS — E7800 Pure hypercholesterolemia, unspecified: Secondary | ICD-10-CM

## 2024-04-21 DIAGNOSIS — Z5181 Encounter for therapeutic drug level monitoring: Secondary | ICD-10-CM

## 2024-04-21 NOTE — Telephone Encounter (Signed)
 Pease refer to [employer] for the Lilly EZEF lepodisiran study for elevated lipoprotein (a). Not sure if she qualifies--her Lp(a) is >175 and she has known aortic atherosclerosis per scans.  Negative calcium  score.  +FH CAD. Thanks

## 2024-04-21 NOTE — Telephone Encounter (Signed)
 Done

## 2024-04-22 ENCOUNTER — Encounter: Payer: Self-pay | Admitting: Family Medicine

## 2024-04-28 ENCOUNTER — Other Ambulatory Visit: Payer: Self-pay | Admitting: Family Medicine

## 2024-04-28 DIAGNOSIS — F418 Other specified anxiety disorders: Secondary | ICD-10-CM

## 2024-06-07 DIAGNOSIS — L810 Postinflammatory hyperpigmentation: Secondary | ICD-10-CM | POA: Diagnosis not present

## 2024-07-26 ENCOUNTER — Encounter: Payer: Self-pay | Admitting: Neurology

## 2024-07-26 ENCOUNTER — Ambulatory Visit: Admitting: Neurology

## 2024-07-26 VITALS — BP 124/84 | HR 76 | Ht 66.0 in | Wt 151.2 lb

## 2024-07-26 DIAGNOSIS — H8110 Benign paroxysmal vertigo, unspecified ear: Secondary | ICD-10-CM | POA: Diagnosis not present

## 2024-07-26 NOTE — Progress Notes (Signed)
 NEUROLOGY CONSULTATION NOTE  Sabrina Chapman MRN: [id number] DOB: 01-May-1971  Referring provider: Annabelle Fetters, MD Primary care provider: Annabelle Fetters, MD  Reason for consult:  headache, vertigo, bruxism  Assessment/Plan:   Benign paroxysmal positional vertigo  Episodes are brief and clearly triggered by change in position.  While she appears to have occasional tension-type headache, she does not have history of migraine or other significant headaches.  Neurologic exam is normal and with such episodic symptoms over 2 years, I do not suspect an intracranial etiology.  She does not have history of migraines.  If she should have a recurrence, consider referral to vestibular rehab for treatment and to be shown how to correctly perform the Epley maneuver.    Total time spent in chart and face to face with patient:  45 minutes   Subjective:  Discussed the use of AI scribe software for clinical note transcription with the patient, who gave verbal consent to proceed.  History of Present Illness Sabrina Chapman is a 53 year old female who presents with chronic vertigo.  She has experienced vertigo for a couple of years, with episodes occurring once a month or every other month. The vertigo is described as a spinning sensation that is positional, often triggered by movements such as getting up from bed, sitting up from a lying position, or bending over. Episodes last 30-60 seconds multiple times a day with change in position for three to four days, with some lower-level symptoms persisting for up to two weeks. The Epley maneuver has been inconsistently effective, and she sometimes associates the vertigo with dehydration, though this is not always clear.  She has been prescribed meclizine  for the vertigo, which works well but causes drowsiness, so she uses it sparingly. The vertigo has become slightly worse or more frequent over the past couple of years, but it is not associated with ear  infections, viral illnesses, or any ear symptoms such as ringing, hearing loss, or a feeling of fullness. No nausea, double vision, ear ringing, hearing loss, ear fullness, or visual symptoms associated with the vertigo.  She experiences headaches that are not associated with the vertigo. These headaches are mild to moderate, located in the bifrontal area, and described as an ache. They are not accompanied by nausea, sensitivity to light or sound, or visual symptoms. She also reports stress-related jaw tension, for which she uses a mouth guard.  She also reports muscular neck pain, which she attributes to sitting at a desk for extended periods.  Otherwise, no history of migraines or remote history of headaches.    PAST MEDICAL HISTORY: Past Medical History:  Diagnosis Date   Anxiety in her early 20's   took Paxil   Dental crown present    Depression 11/2018   started on Prozac  by GYN   Macromastia 07/2018    PAST SURGICAL HISTORY: Past Surgical History:  Procedure Laterality Date   BILATERAL SALPINGECTOMY  09/23/2014   BLADDER SUSPENSION N/A 02/23/2020   Procedure: TRANSVAGINAL TAPE (TVT) PROCEDURE;  Surgeon: Sarrah Browning, MD;  Location: [facility] [place];  Service: Gynecology;  Laterality: N/A;   BREAST REDUCTION SURGERY Bilateral 07/24/2018   Procedure: BILATERAL MAMMARY REDUCTION  (BREAST);  Surgeon: Arelia Filippo, MD;  Location: [facility];  Service: Plastics;  Laterality: Bilateral;   BUNIONECTOMY Right 01/28/2017   CESAREAN SECTION  07/15/2012   Procedure: CESAREAN SECTION;  Surgeon: Browning DELENA Sarrah, MD;  Location: [facility];  Service: Obstetrics;  Laterality: N/A;  CESAREAN SECTION N/A 09/23/2014   Procedure: CESAREAN SECTION;  Surgeon: Rosaline DELENA Luna, MD;  Location: [facility];  Service: Obstetrics;  Laterality: N/A;   CYSTOCELE REPAIR N/A 02/23/2020   Procedure: ANTERIOR REPAIR (CYSTOCELE);  Surgeon: Luna Rosaline, MD;  Location: [facility];  Service: Gynecology;  Laterality: N/A;   CYSTOSCOPY N/A 02/23/2020   Procedure: CYSTOSCOPY;  Surgeon: Luna Rosaline, MD;  Location: [facility];  Service: Gynecology;  Laterality: N/A;   CYSTOSCOPY WITH INJECTION N/A 08/01/2021   Procedure: CYSTOSCOPY WITH INJECTION OF INDOCYANINE GREEN DYE;  Surgeon: Alvaro Hummer, MD;  Location: [facility];  Service: Urology;  Laterality: N/A;   DILATION AND CURETTAGE OF UTERUS  2008   NASAL SINUS SURGERY     as a child   ROBOT ASSISTED LAPAROSCOPIC COMPLETE CYSTECT ILEAL CONDUIT N/A 08/01/2021   Procedure: XI ROBOTIC ASSISTED LAPAROSCOPIC REMOVAL OF BLADDER MESH;  Surgeon: Alvaro Hummer, MD;  Location: [facility];  Service: Urology;  Laterality: N/A;   ROBOTIC ASSISTED TOTAL HYSTERECTOMY WITH BILATERAL SALPINGO OOPHERECTOMY Bilateral 02/23/2020   Procedure: XI ROBOTIC ASSISTED TOTAL HYSTERECTOMY WITH BILATERAL SALPINGO OOPHORECTOMY;  Surgeon: Luna Rosaline, MD;  Location: [facility];  Service: Gynecology;  Laterality: Bilateral;   TUBAL LIGATION      MEDICATIONS: Current Outpatient Medications on File Prior to Visit  Medication Sig Dispense Refill   busPIRone  (BUSPAR ) 15 MG tablet Take 1/2 tablet by mouth twice daily.  Titrate up to the full tablet as directed by physician. (Patient not taking: Reported on 04/19/2024) 60 tablet 1   cholecalciferol (VITAMIN D3) 25 MCG (1000 UNIT) tablet Take 1,000 Units by mouth daily.     Creatine Monohydrate POWD Take 5-20 g by mouth 3 (three) times a week.     Cyanocobalamin (VITAMIN B-12) 1000 MCG SUBL Place 1 each under the tongue daily.     estradiol  (ESTRACE ) 1 MG tablet Take 1 tablet by mouth daily.     fluticasone  (FLONASE ) 50 MCG/ACT nasal spray Place 2 sprays into both nostrils daily. (Patient not taking: Reported on 04/19/2024)     loratadine (CLARITIN) 10 MG tablet Take 10 mg by mouth daily.     meclizine  (ANTIVERT ) 12.5 MG tablet Take 1 tablet (12.5 mg total) by  mouth 3 (three) times daily as needed for dizziness. 30 tablet 0   Omega-3 Fatty Acids (FISH OIL) 1000 MG CPDR Take 2,000 mg by mouth daily.     ondansetron  (ZOFRAN -ODT) 8 MG disintegrating tablet Take 1 tablet (8 mg total) by mouth every 8 (eight) hours as needed for nausea or vomiting. 20 tablet 0   propranolol  (INDERAL ) 10 MG tablet Use 1 tablet 30 minutes prior to presentations (Patient not taking: Reported on 04/19/2024) 30 tablet 1   rosuvastatin  (CRESTOR ) 10 MG tablet Take 1 tablet (10 mg total) by mouth daily. 90 tablet 0   THEANINE 100 PO Take 2 capsules by mouth daily.     triamcinolone  cream (KENALOG ) 0.1 % Apply 1 Application topically 2 (two) times daily as needed (eczema flare). (Patient not taking: Reported on 04/19/2024) 30 g 0   No current facility-administered medications on file prior to visit.    ALLERGIES: Allergies  Allergen Reactions   Citric Acid Other (See Comments)    GI UPSET    FAMILY HISTORY: Family History  Problem Relation Age of Onset   Cancer Mother        breast @62 , ovarian @65    Hyperlipidemia Mother    Heart disease  Mother 34       by pass surgery   Breast cancer Mother 40   Ovarian cancer Mother 49       metastatic to abd/omentum (diagnosed after finding pleural effusions)   Dementia Father    Hyperlipidemia Father    Allergies Father    Depression Father    Cancer Maternal Grandmother 29       breast cancer   Breast cancer Maternal Grandmother    Heart disease Maternal Grandfather    Depression Paternal Grandmother    Colon polyps Neg Hx    Esophageal cancer Neg Hx    Colon cancer Neg Hx    Rectal cancer Neg Hx    Stomach cancer Neg Hx     Objective:  Blood pressure 124/84, pulse 76, height 5' 6 (1.676 m), weight 151 lb 3.2 oz (68.6 kg), last menstrual period 01/28/2018, SpO2 99%. General: No acute distress.  Patient appears well-groomed.   Head:  Normocephalic/atraumatic, mild right TMJ tenderness to palpation. Eyes:  fundi  examined but not visualized Neck: supple, no paraspinal tenderness, full range of motion Heart: regular rate and rhythm Neurological Exam: Mental status: alert and oriented to person, place, and time, speech fluent and not dysarthric, language intact. Cranial nerves: CN I: not tested CN II: pupils equal, round and reactive to light, visual fields intact CN III, IV, VI:  full range of motion, no nystagmus, no ptosis CN V: facial sensation intact. CN VII: upper and lower face symmetric CN VIII: hearing intact CN IX, X: gag intact, uvula midline CN XI: sternocleidomastoid and trapezius muscles intact CN XII: tongue midline Bulk & Tone: normal, no fasciculations. Motor:  muscle strength 5/5 throughout Sensation:  Pinprick and vibratory sensation intact. Deep Tendon Reflexes:  2+ throughout,  toes downgoing.   Finger to nose testing:  Without dysmetria.   Gait:  Normal station and stride.  Romberg negative.    Thank you for allowing me to take part in the care of this patient.  Juliene Dunnings, DO  CC: Annabelle Fetters, MD

## 2024-07-26 NOTE — Patient Instructions (Signed)
 I think it is garden variety benign paroxysmal positional vertigo.  If you get another episode, I would recommend seeing physical therapist for vestibular rehab where they can treat it and teach you the Epley maneuver correctly

## 2024-08-22 ENCOUNTER — Other Ambulatory Visit: Payer: Self-pay | Admitting: Family Medicine

## 2024-08-22 DIAGNOSIS — L309 Dermatitis, unspecified: Secondary | ICD-10-CM

## 2024-08-23 NOTE — Telephone Encounter (Signed)
Is this okay refill? 

## 2025-03-21 ENCOUNTER — Encounter: Payer: Self-pay | Admitting: Family Medicine
